# Patient Record
Sex: Female | Born: 1990 | Race: Black or African American | Hispanic: No | Marital: Single | Smoking: Never smoker
Health system: Southern US, Community
[De-identification: ages and names within clinical notes are randomized; demographics above are authoritative.]

## PROBLEM LIST (undated history)

## (undated) DIAGNOSIS — L509 Urticaria, unspecified: Secondary | ICD-10-CM

## (undated) DIAGNOSIS — L309 Dermatitis, unspecified: Secondary | ICD-10-CM

## (undated) DIAGNOSIS — F32A Depression, unspecified: Secondary | ICD-10-CM

## (undated) DIAGNOSIS — J069 Acute upper respiratory infection, unspecified: Secondary | ICD-10-CM

## (undated) DIAGNOSIS — J45909 Unspecified asthma, uncomplicated: Secondary | ICD-10-CM

## (undated) HISTORY — PX: WISDOM TOOTH EXTRACTION: SHX21

## (undated) HISTORY — DX: Acute upper respiratory infection, unspecified: J06.9

## (undated) HISTORY — DX: Urticaria, unspecified: L50.9

---
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
## 2008-01-31 IMAGING — US US OB FOLLOW-UP
1 series · 14 of 28 positions shown · non-contrast
Comparison: none

OBSTETRICAL ULTRASOUND:

 This ultrasound exam was performed in the [HOSPITAL] Ultrasound Department.  The OB US report was generated in the AS system, and faxed to the ordering physician.  This report is also available in [REDACTED] PACS.

[Series 1: us ob comp +14 wk · 14 of 113 slices shown]
[im 5/113]
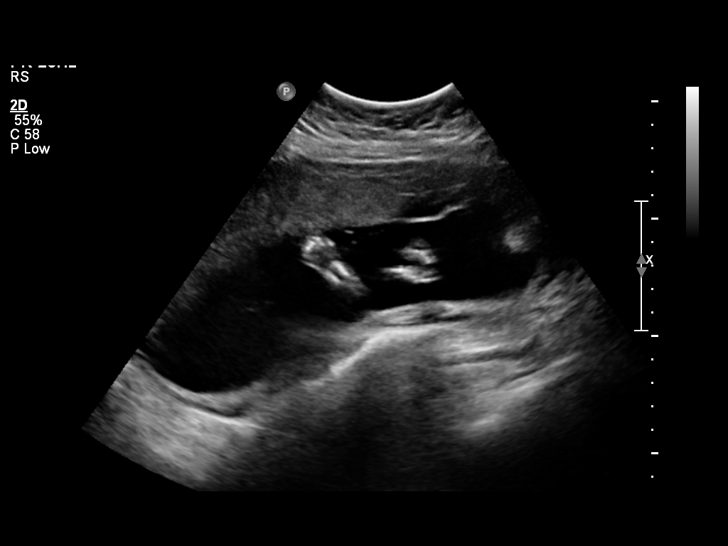
[im 13/113]
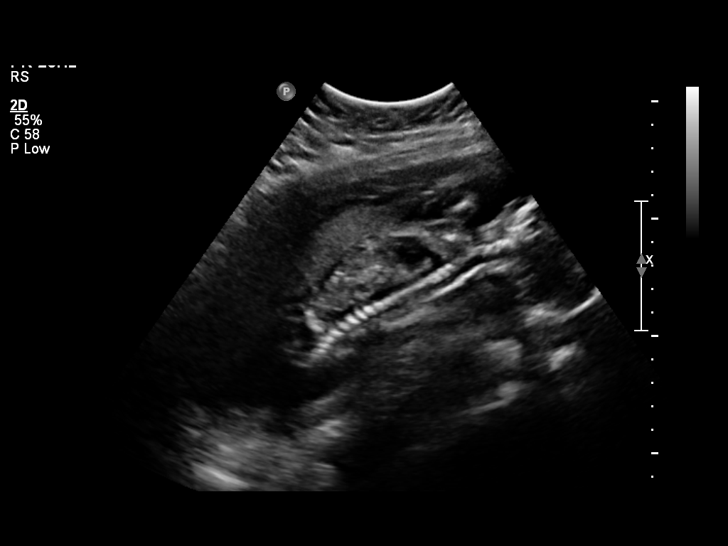
[im 21/113]
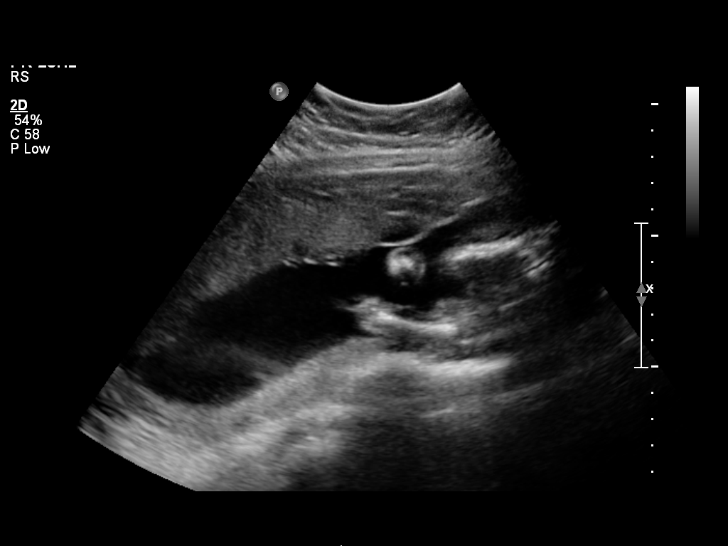
[im 30/113]
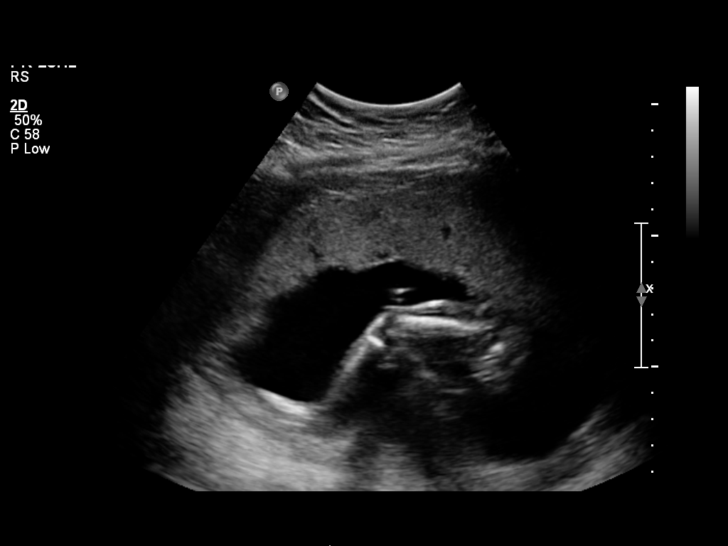
[im 38/113]
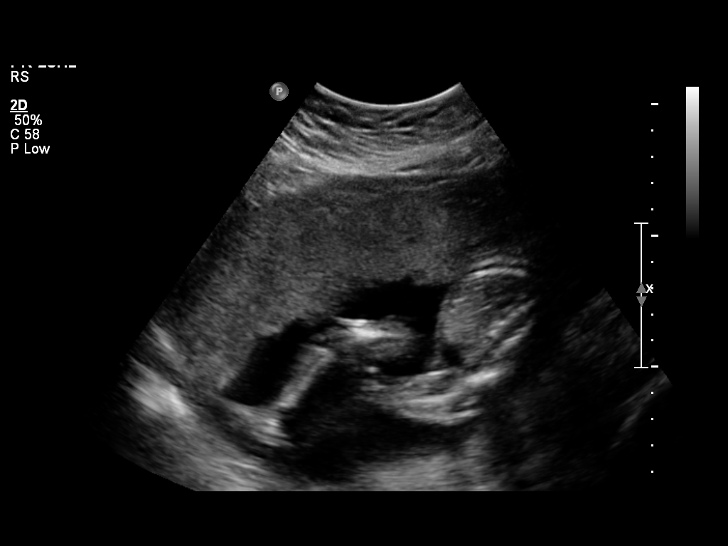
[im 46/113]
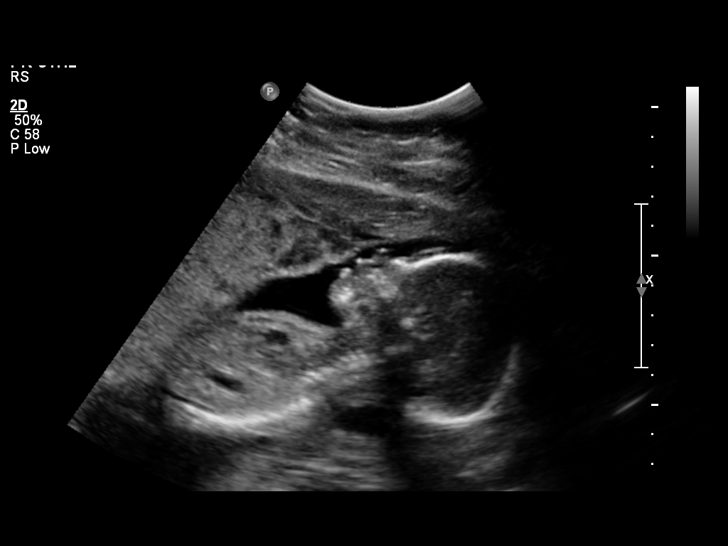
[im 54/113]
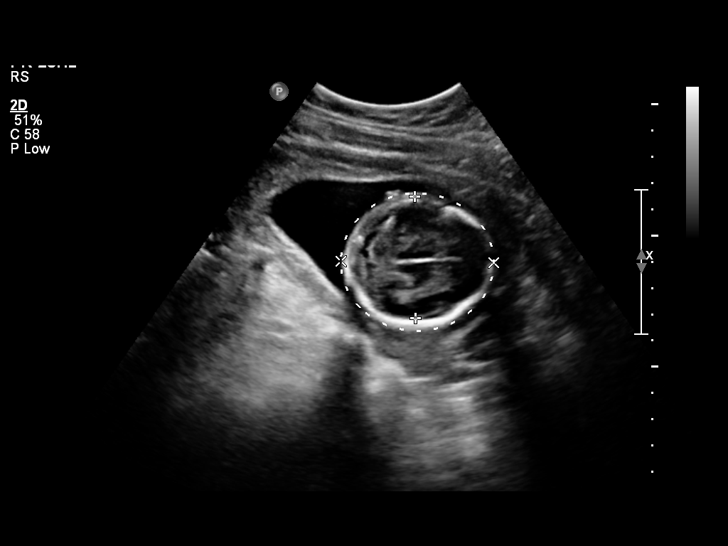
[im 63/113]
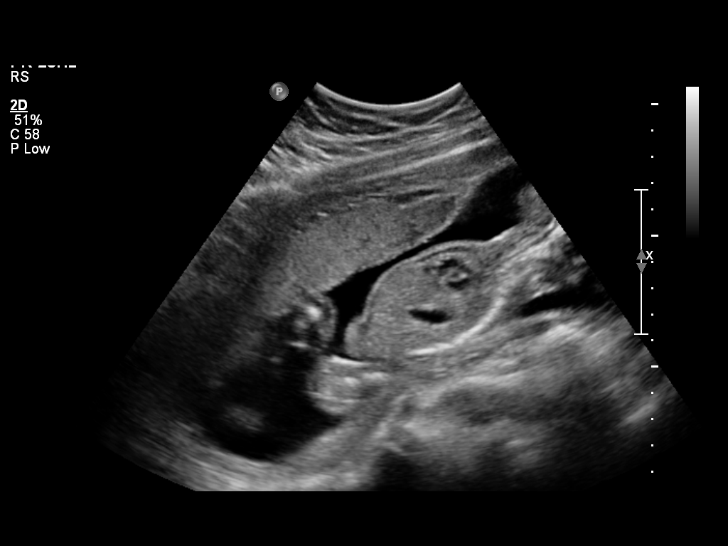
[im 71/113]
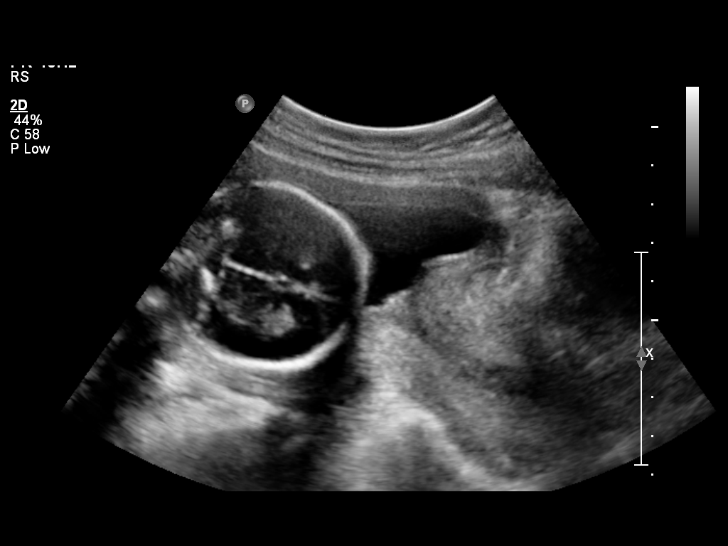
[im 79/113]
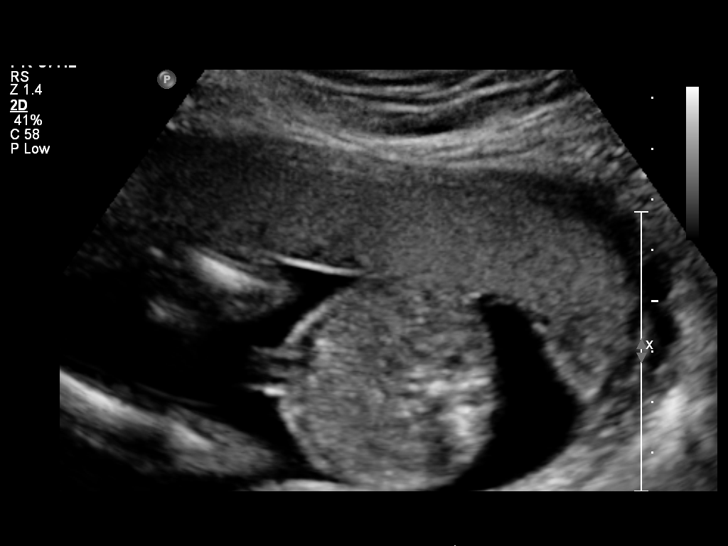
[im 88/113]
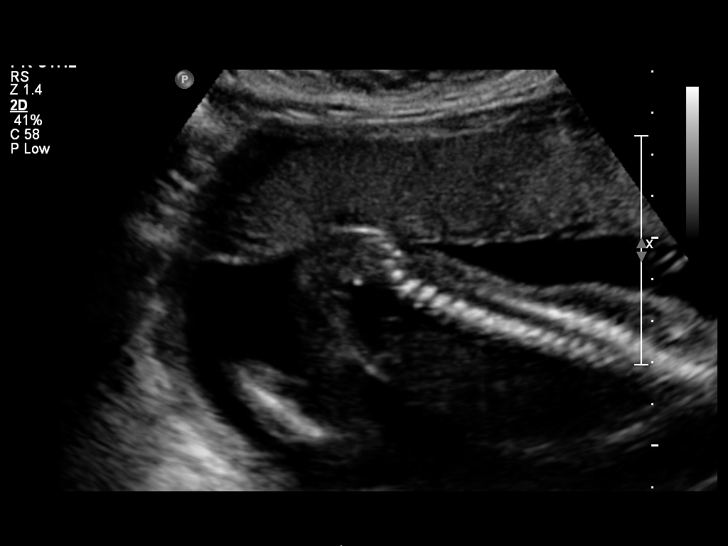
[im 96/113]
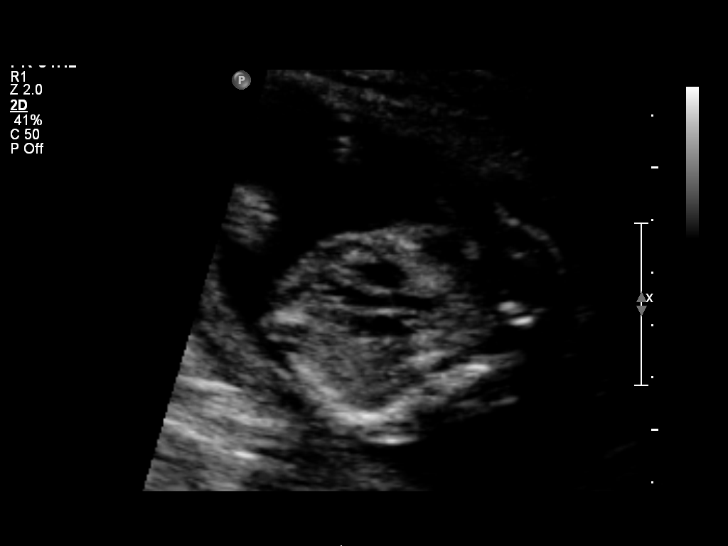
[im 104/113]
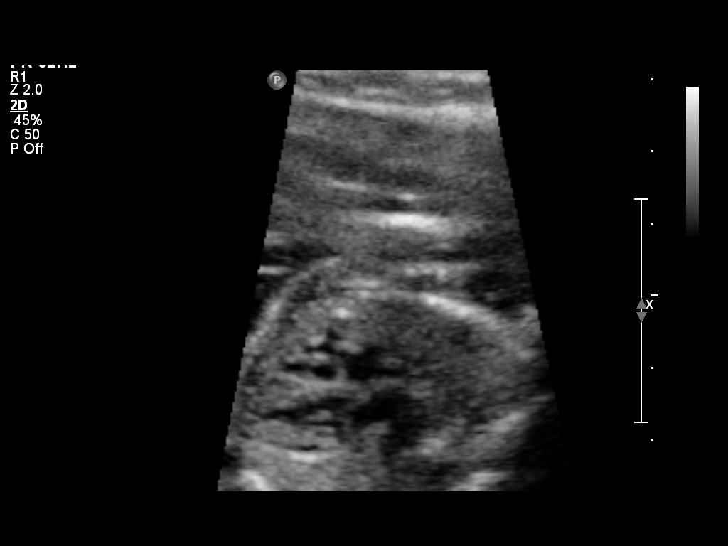
[im 113/113]
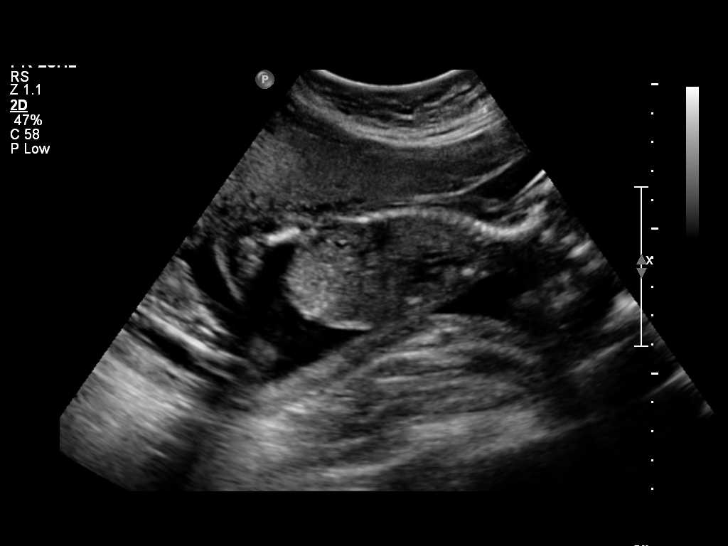

[14 of 28 positions shown; findings below may reference images not displayed]

IMPRESSION: See AS Obstetric US report.

## 2008-03-27 ENCOUNTER — Emergency Department (HOSPITAL_COMMUNITY): Admission: EM | Admit: 2008-03-27 | Discharge: 2008-03-27 | Payer: Self-pay | Admitting: Emergency Medicine

## 2008-08-20 ENCOUNTER — Emergency Department (HOSPITAL_COMMUNITY): Admission: EM | Admit: 2008-08-20 | Discharge: 2008-08-21 | Payer: Self-pay | Admitting: Emergency Medicine

## 2008-08-21 ENCOUNTER — Inpatient Hospital Stay (HOSPITAL_COMMUNITY): Admission: AD | Admit: 2008-08-21 | Discharge: 2008-09-03 | Payer: Self-pay | Admitting: Psychiatry

## 2008-08-21 ENCOUNTER — Ambulatory Visit: Payer: Self-pay | Admitting: Psychiatry

## 2008-11-07 ENCOUNTER — Inpatient Hospital Stay (HOSPITAL_COMMUNITY): Admission: AD | Admit: 2008-11-07 | Discharge: 2008-11-07 | Payer: Self-pay | Admitting: Obstetrics and Gynecology

## 2008-11-27 ENCOUNTER — Ambulatory Visit: Payer: Self-pay | Admitting: Family Medicine

## 2008-11-27 DIAGNOSIS — F339 Major depressive disorder, recurrent, unspecified: Secondary | ICD-10-CM

## 2008-11-27 DIAGNOSIS — F329 Major depressive disorder, single episode, unspecified: Secondary | ICD-10-CM

## 2008-11-27 DIAGNOSIS — L2089 Other atopic dermatitis: Secondary | ICD-10-CM | POA: Insufficient documentation

## 2008-11-27 HISTORY — DX: Major depressive disorder, recurrent, unspecified: F33.9

## 2008-12-05 ENCOUNTER — Ambulatory Visit: Payer: Self-pay | Admitting: Family Medicine

## 2008-12-05 ENCOUNTER — Encounter: Payer: Self-pay | Admitting: Family Medicine

## 2009-01-08 ENCOUNTER — Ambulatory Visit: Payer: Self-pay | Admitting: Family Medicine

## 2009-12-13 ENCOUNTER — Emergency Department (HOSPITAL_COMMUNITY): Admission: EM | Admit: 2009-12-13 | Discharge: 2009-12-13 | Payer: Self-pay | Admitting: Family Medicine

## 2009-12-19 ENCOUNTER — Inpatient Hospital Stay (HOSPITAL_COMMUNITY): Admission: AD | Admit: 2009-12-19 | Discharge: 2009-12-19 | Payer: Self-pay | Admitting: Obstetrics & Gynecology

## 2010-01-16 ENCOUNTER — Inpatient Hospital Stay (HOSPITAL_COMMUNITY)
Admission: AD | Admit: 2010-01-16 | Discharge: 2010-01-16 | Payer: Self-pay | Source: Home / Self Care | Admitting: Obstetrics and Gynecology

## 2010-03-01 ENCOUNTER — Inpatient Hospital Stay (HOSPITAL_COMMUNITY)
Admission: AD | Admit: 2010-03-01 | Discharge: 2010-03-01 | Payer: Self-pay | Source: Home / Self Care | Attending: Obstetrics & Gynecology | Admitting: Obstetrics & Gynecology

## 2010-03-04 LAB — POCT PREGNANCY, URINE: Preg Test, Ur: NEGATIVE

## 2010-03-04 LAB — URINALYSIS, ROUTINE W REFLEX MICROSCOPIC
Leukocytes, UA: NEGATIVE
Nitrite: NEGATIVE
Protein, ur: NEGATIVE mg/dL
Urine Glucose, Fasting: NEGATIVE mg/dL
Urobilinogen, UA: 0.2 mg/dL (ref 0.0–1.0)

## 2010-03-04 LAB — URINE MICROSCOPIC-ADD ON

## 2010-03-04 LAB — CBC
Hemoglobin: 11 g/dL — ABNORMAL LOW (ref 12.0–15.0)
MCHC: 31.9 g/dL (ref 30.0–36.0)
Platelets: 401 10*3/uL — ABNORMAL HIGH (ref 150–400)
RDW: 13.5 % (ref 11.5–15.5)

## 2010-03-21 ENCOUNTER — Encounter: Payer: Self-pay | Admitting: *Deleted

## 2010-04-03 ENCOUNTER — Encounter: Payer: Self-pay | Admitting: Family Medicine

## 2010-04-22 LAB — DIFFERENTIAL
Basophils Absolute: 0 10*3/uL (ref 0.0–0.1)
Basophils Absolute: 0.1 10*3/uL (ref 0.0–0.1)
Basophils Relative: 1 % (ref 0–1)
Eosinophils Relative: 2 % (ref 0–5)
Lymphocytes Relative: 39 % (ref 12–46)
Lymphocytes Relative: 42 % (ref 12–46)
Monocytes Absolute: 0.5 10*3/uL (ref 0.1–1.0)
Monocytes Absolute: 0.5 10*3/uL (ref 0.1–1.0)
Monocytes Relative: 8 % (ref 3–12)
Neutro Abs: 3 10*3/uL (ref 1.7–7.7)
Neutrophils Relative %: 48 % (ref 43–77)

## 2010-04-22 LAB — URINALYSIS, ROUTINE W REFLEX MICROSCOPIC
Bilirubin Urine: NEGATIVE
Glucose, UA: NEGATIVE mg/dL
Glucose, UA: NEGATIVE mg/dL
Ketones, ur: NEGATIVE mg/dL
Ketones, ur: NEGATIVE mg/dL
Nitrite: NEGATIVE
Protein, ur: NEGATIVE mg/dL
Specific Gravity, Urine: 1.015 (ref 1.005–1.030)
pH: 7.5 (ref 5.0–8.0)

## 2010-04-22 LAB — WET PREP, GENITAL
Trich, Wet Prep: NONE SEEN
Yeast Wet Prep HPF POC: NONE SEEN
Yeast Wet Prep HPF POC: NONE SEEN

## 2010-04-22 LAB — CBC
HCT: 34.6 % — ABNORMAL LOW (ref 36.0–46.0)
Hemoglobin: 11.3 g/dL — ABNORMAL LOW (ref 12.0–15.0)
MCHC: 32.6 g/dL (ref 30.0–36.0)
MCV: 88.9 fL (ref 78.0–100.0)
Platelets: 381 10*3/uL (ref 150–400)
RDW: 14.6 % (ref 11.5–15.5)
WBC: 6.2 10*3/uL (ref 4.0–10.5)
WBC: 6.5 10*3/uL (ref 4.0–10.5)

## 2010-04-22 LAB — URINE MICROSCOPIC-ADD ON

## 2010-04-22 LAB — POCT PREGNANCY, URINE
Preg Test, Ur: NEGATIVE
Preg Test, Ur: NEGATIVE

## 2010-04-22 LAB — GC/CHLAMYDIA PROBE AMP, URINE
Chlamydia, Swab/Urine, PCR: NEGATIVE
GC Probe Amp, Urine: NEGATIVE

## 2010-04-28 ENCOUNTER — Ambulatory Visit (INDEPENDENT_AMBULATORY_CARE_PROVIDER_SITE_OTHER): Payer: Medicaid Other | Admitting: Family Medicine

## 2010-04-28 ENCOUNTER — Other Ambulatory Visit (HOSPITAL_COMMUNITY)
Admission: RE | Admit: 2010-04-28 | Discharge: 2010-04-28 | Disposition: A | Discharge status: Home / Self Care | Payer: Medicaid Other | Source: Ambulatory Visit | Attending: Family Medicine | Admitting: Family Medicine

## 2010-04-28 VITALS — BP 110/68 | HR 64 | Temp 98.3°F | Wt 180.0 lb

## 2010-04-28 DIAGNOSIS — Z01419 Encounter for gynecological examination (general) (routine) without abnormal findings: Secondary | ICD-10-CM | POA: Insufficient documentation

## 2010-04-28 DIAGNOSIS — N921 Excessive and frequent menstruation with irregular cycle: Secondary | ICD-10-CM | POA: Insufficient documentation

## 2010-04-28 DIAGNOSIS — F329 Major depressive disorder, single episode, unspecified: Secondary | ICD-10-CM

## 2010-04-28 DIAGNOSIS — L2089 Other atopic dermatitis: Secondary | ICD-10-CM

## 2010-04-28 DIAGNOSIS — Z3009 Encounter for other general counseling and advice on contraception: Secondary | ICD-10-CM

## 2010-04-28 DIAGNOSIS — Z124 Encounter for screening for malignant neoplasm of cervix: Secondary | ICD-10-CM

## 2010-04-28 DIAGNOSIS — N920 Excessive and frequent menstruation with regular cycle: Secondary | ICD-10-CM

## 2010-04-28 LAB — CBC
HCT: 33.4 % — ABNORMAL LOW (ref 36.0–46.0)
MCHC: 31.1 g/dL (ref 30.0–36.0)
Platelets: 370 10*3/uL (ref 150–400)
RDW: 14.1 % (ref 11.5–15.5)
WBC: 6.8 10*3/uL (ref 4.0–10.5)

## 2010-04-28 MED ORDER — TRIAMCINOLONE ACETONIDE 0.1 % EX OINT: TOPICAL_OINTMENT | Freq: Two times a day (BID) | CUTANEOUS | Status: DC

## 2010-04-28 MED ORDER — CARRINGTON MOISTURE BARRIER EX CREA: TOPICAL_CREAM | CUTANEOUS | Status: AC | PRN

## 2010-04-28 MED ORDER — CITALOPRAM HYDROBROMIDE 40 MG PO TABS: 40.0000 mg | ORAL_TABLET | Freq: Every day | ORAL | Status: DC

## 2010-04-28 MED ORDER — ETONOGESTREL-ETHINYL ESTRADIOL 0.12-0.015 MG/24HR VA RING: 1.0000 | VAGINAL_RING | VAGINAL | Status: DC

## 2010-04-28 MED ORDER — CETIRIZINE HCL 10 MG PO TABS: 10.0000 mg | ORAL_TABLET | Freq: Every day | ORAL | Status: DC

## 2010-04-28 NOTE — Progress Notes (Signed)
  Subjective:     Carolyn Alvarez is a 20 y.o. female and is here for a comprehensive physical exam. The patient reports problems - depression and bleeding 20/30 days of the month since implanon was put in more than 2 years ago. Depression- dealing with lots of life stress, recently got to her own apt, had been homeless.  Feels that she is more down than up in terms of mood.  Denies SI, HI  Implanon- will be 1 years in May.  1st year was ok, but after that has been bleeidng 20/30 days in a month.  Would like implanon removed.  WOuld like nuvaring.  History   Social History  . Marital Status: Single    Spouse Name: N/A    Number of Children: N/A  . Years of Education: N/A   Occupational History  . Not on file.   Social History Main Topics  . Smoking status: Not on file  . Smokeless tobacco: Not on file  . Alcohol Use: Not on file  . Drug Use: Not on file  . Sexually Active: Not on file   Other Topics Concern  . Not on file   Social History Narrative  . No narrative on file   Health Maintenance  Topic Date Due  . Pap Smear  12/18/2006  . Tetanus/tdap  12/18/2007    The following portions of the patient's history were reviewed and updated as appropriate: allergies, current medications and problem list.  Review of Systems Pertinent items are noted in HPI.   Objective:    BP 110/68  Pulse 64  Temp(Src) 98.3 F (36.8 C) (Oral)  Wt 180 lb (81.647 kg) General appearance: alert, cooperative, mild distress and mildly obese Head: Normocephalic, without obvious abnormality, atraumatic Neck: no adenopathy, supple, symmetrical, trachea midline and thyroid not enlarged, symmetric, no tenderness/mass/nodules Lungs: clear to auscultation bilaterally and normal percussion bilaterally Heart: regular rate and rhythm, S1, S2 normal, no murmur, click, rub or gallop Abdomen: soft, non-tender; bowel sounds normal; no masses,  no organomegaly Pelvic: adnexae not palpable, cervix  normal in appearance, external genitalia normal, no cervical motion tenderness, rectovaginal septum normal, uterus normal size, shape, and consistency and vagina normal without discharge Extremities: extremities normal, atraumatic, no cyanosis or edema    Assessment:    Healthy female exam.  Mild depression Dysfunctional bleeding associated with implanon     Plan:    depression- restart celexa implanon- start nuvaring today to help with bleeding.  Make appt for implanon removal.  See After Visit Summary for Counseling Recommendations

## 2010-04-28 NOTE — Patient Instructions (Signed)
It was nice to see you today Please schedule a visit for implanon removal Start the nuvaring, this should help regulate bleeding Start taking iron twice a day

## 2010-05-14 ENCOUNTER — Encounter: Payer: Self-pay | Admitting: Family Medicine

## 2010-05-14 ENCOUNTER — Ambulatory Visit (INDEPENDENT_AMBULATORY_CARE_PROVIDER_SITE_OTHER): Payer: Medicaid Other | Admitting: Family Medicine

## 2010-05-14 VITALS — BP 118/77 | HR 74 | Temp 98.8°F | Wt 185.4 lb

## 2010-05-14 DIAGNOSIS — N920 Excessive and frequent menstruation with regular cycle: Secondary | ICD-10-CM

## 2010-05-14 DIAGNOSIS — Z3009 Encounter for other general counseling and advice on contraception: Secondary | ICD-10-CM

## 2010-05-14 DIAGNOSIS — Z30017 Encounter for initial prescription of implantable subdermal contraceptive: Secondary | ICD-10-CM

## 2010-05-14 NOTE — Assessment & Plan Note (Signed)
implanon removed today.  nuvaring to continue

## 2010-05-14 NOTE — Progress Notes (Signed)
implanon removal.  Pt consented.  Area cleaned with alcohol and place marked.  3 cc of lidocaine injected.  Sterile gloves donned, area cleaned with betadine x 3.  1cm incision made with scalpel.  Tip visualized and area probed with curved forceps.  implanon removed and measured to 4cm.  Area was cleaned, steristrip applied, and bandage applied

## 2010-05-14 NOTE — Assessment & Plan Note (Signed)
Improved.  On nuvaring.  implanon out today

## 2010-05-16 LAB — CBC
Hemoglobin: 10.9 g/dL — ABNORMAL LOW (ref 12.0–15.0)
MCHC: 33.1 g/dL (ref 30.0–36.0)
RBC: 3.72 MIL/uL — ABNORMAL LOW (ref 3.87–5.11)
WBC: 6.5 10*3/uL (ref 4.0–10.5)

## 2010-05-16 LAB — POCT PREGNANCY, URINE: Preg Test, Ur: NEGATIVE

## 2010-05-16 LAB — URINALYSIS, ROUTINE W REFLEX MICROSCOPIC
Glucose, UA: NEGATIVE mg/dL
Leukocytes, UA: NEGATIVE
Protein, ur: NEGATIVE mg/dL
Specific Gravity, Urine: >1.03 — ABNORMAL HIGH (ref 1.005–1.030)
pH: 6 (ref 5.0–8.0)

## 2010-05-16 LAB — WET PREP, GENITAL

## 2010-05-16 LAB — URINE MICROSCOPIC-ADD ON

## 2010-05-18 LAB — DIFFERENTIAL
Basophils Absolute: 0 10*3/uL (ref 0.0–0.1)
Lymphocytes Relative: 39 % (ref 12–46)
Lymphs Abs: 3 10*3/uL (ref 0.7–4.0)
Neutro Abs: 4.2 10*3/uL (ref 1.7–7.7)

## 2010-05-18 LAB — CBC
Hemoglobin: 11.4 g/dL — ABNORMAL LOW (ref 12.0–15.0)
Platelets: 379 10*3/uL (ref 150–400)
RDW: 13.9 % (ref 11.5–15.5)
WBC: 7.8 10*3/uL (ref 4.0–10.5)

## 2010-05-18 LAB — URINALYSIS, ROUTINE W REFLEX MICROSCOPIC
Bilirubin Urine: NEGATIVE
Glucose, UA: NEGATIVE mg/dL
Glucose, UA: NEGATIVE mg/dL
Hgb urine dipstick: NEGATIVE
Ketones, ur: NEGATIVE mg/dL
Ketones, ur: NEGATIVE mg/dL
Nitrite: NEGATIVE
pH: 5.5 (ref 5.0–8.0)
pH: 6 (ref 5.0–8.0)

## 2010-05-18 LAB — RAPID URINE DRUG SCREEN, HOSP PERFORMED
Barbiturates: NOT DETECTED
Benzodiazepines: NOT DETECTED
Cocaine: NOT DETECTED
Opiates: NOT DETECTED

## 2010-05-18 LAB — ETHANOL: Alcohol, Ethyl (B): <5 mg/dL (ref 0–10)

## 2010-05-18 LAB — BASIC METABOLIC PANEL
BUN: 12 mg/dL (ref 6–23)
Calcium: 9.3 mg/dL (ref 8.4–10.5)
GFR calc non Af Amer: >60 mL/min (ref >60)
Glucose, Bld: 88 mg/dL (ref 70–99)
Potassium: 3.9 mEq/L (ref 3.5–5.1)
Sodium: 138 mEq/L (ref 135–145)

## 2010-05-18 LAB — POCT PREGNANCY, URINE: Preg Test, Ur: NEGATIVE

## 2010-05-18 LAB — URINE MICROSCOPIC-ADD ON

## 2010-05-29 ENCOUNTER — Ambulatory Visit: Payer: Medicaid Other | Admitting: Family Medicine

## 2010-05-29 ENCOUNTER — Telehealth: Payer: Self-pay | Admitting: Family Medicine

## 2010-05-29 NOTE — Telephone Encounter (Signed)
Pt had appt in colpo clinic today to check for her iud, she thinks it came out. Pt missed the appt, wants to know what she should do since we are full today?

## 2010-05-30 NOTE — Telephone Encounter (Signed)
Patient called and is wanting to know what MD thinks she should do

## 2010-05-30 NOTE — Telephone Encounter (Signed)
Called and left VM for pt to call back.  Pt should use back up method of contraception until able to determine IUD status

## 2010-05-30 NOTE — Telephone Encounter (Signed)
When patient calls back she needs to schedule another appt and use Back up method as mention by MD previously [employer], Maryjo Rochester

## 2010-06-24 NOTE — H&P (Signed)
NAME:  Carolyn Alvarez, Carolyn Alvarez         ACCOUNT NO.:  [id number]   MEDICAL RECORD NO.:  [id number]          PATIENT TYPE:  IPS   LOCATION:  0400                          FACILITY:  [facility]   PHYSICIAN:  Anselm Jungling, MD  DATE OF BIRTH:  Mar 02, 1990   DATE OF ADMISSION:  08/21/2008  DATE OF DISCHARGE:                       PSYCHIATRIC ADMISSION ASSESSMENT   HISTORY OF PRESENT ILLNESS:  The patient reports with a history  depression, suicidal thoughts, had no specific plan.  Reports having  conflict with her mother, who asked her to leave the home with mother  not being supportive of her being a single mother.  Her stressors are  that the patient is currently homeless.   PAST PSYCHIATRIC HISTORY:  First admission to [facility].  No other psychiatric admissions.  She has been seeing a therapist, Helane Gunther at [facility] for the past 2 months.   SOCIAL HISTORY:  A 20 year old female lives in [place], currently  homeless.  She has a 38-month-old daughter that her mother is caring  for.  The patient is a sophomore at [employer]   FAMILY HISTORY:  None.   ALCOHOL AND DRUG HISTORY:  The patient drinks alcohol on occasion.  Denies any other substances.   PRIMARY CARE Dorr Perrot:  [facility], sees Dr. Sandi Mealy.   MEDICAL PROBLEMS:  Denies any acute or chronic health issues.   MEDICATIONS:  None prior to arrival.   DRUG ALLERGIES:  NO KNOWN ALLERGIES.   PHYSICAL EXAMINATION:  This is a young female who was fully assessed at  [facility].  Her physical exam was reviewed.  No significant findings.   LABORATORY DATA:  Shows urine that is negative.  Urine drug screen is  negative.  BMET within normal limits.  Alcohol level less than 6.  Urine  pregnancy test negative.  CBC shows a hemoglobin of 11.4, hematocrit of  34.5.   MENTAL STATUS EXAM:  The patient is in the bed.  She is very  cooperative, good eye contact in no acute distress.  Speech is clear,  normal  pace and tone.  The patient's mood is depressed.  Her affect, she  does appears sad.  Thought processes are coherent and goal directed.  Promises safety.  Cognition function intact.  Her memory appears intact.  Judgment and insight are fair.   Axis I:  Depressive disorder, not otherwise specified.  Axis II:  Deferred.  Axis III:  No acute or chronic health issues.  Axis IV:  Problems with housing.  Axis V:  Global Assessment of Functioning current is 35-40.   PLAN:  Our plan is to contact for safety.  The patient may benefit from  an antidepressant.  We will contact mother for background and for the  patient to return to prior living arrangement.  The patient was  encouraged groups.  The patient will need to continue with her  counseling.  Her tentative length of stay at this time is 2-3 days.      Landry Corporal, N.P.      Anselm Jungling, MD  Electronically Signed    JO/MEDQ  D:  08/22/2008  T:  08/22/2008  Job:  [id number]

## 2010-06-27 NOTE — Discharge Summary (Signed)
NAME:  Carolyn Alvarez, Carolyn Alvarez         ACCOUNT NO.:  [id number]   MEDICAL RECORD NO.:  [id number]          PATIENT TYPE:  IPS   LOCATION:  0400                          FACILITY:  [facility]   PHYSICIAN:  Anselm Jungling, MD  DATE OF BIRTH:  10-12-1990   DATE OF ADMISSION:  08/21/2008  DATE OF DISCHARGE:  09/03/2008                               DISCHARGE SUMMARY   IDENTIFYING DATA/REASON FOR ADMISSION:  This was an inpatient  psychiatric admission for Carolyn Alvarez, a 20 year old unmarried single  mother who was admitted due to severe depression with suicidal ideation.  Please refer to the admission note for further details pertaining to the  symptoms, circumstances and history that led to her hospitalization.  She was given an initial Axis I diagnosis of depressive disorder NOS.   MEDICAL/LABORATORY:  The patient was medically and physically assessed  by the psychiatric nurse practitioner.  She was in generally good health  without any active chronic medical problems.  She had been nursing her  young child at the time of admission, but made a decision to wean the  child during the course of her hospital stay in part because of  consideration of psychotropic medication trials for depressive disorder.  There were no other significant medical issues.   HOSPITAL COURSE:  The patient was admitted to the adult inpatient  psychiatric service.  She presented as a well-nourished, normally-  developed young adult female who was alert, fully oriented, pleasant,  but very sad.  She denied any further suicidal ideation, intent or plan.  There were no signs or symptoms of psychosis or thought disorder.  She  accepted being in the inpatient setting for help and treatment.   She was agreeable to a trial of Zoloft 50 mg daily to address her  depressive disorder.  Side effects, risks, and benefits were discussed  at length.  As referenced above, she made a decision to wean her child  so that she could take  Zoloft without concern about any effect on the  child.  She tolerated Zoloft well.   She participated in various therapeutic groups, activities and classes  geared towards helping acquire better coping skills, a better  understanding of her underlying disorder and dynamics, the development  of an aftercare plan.  She worked closely with case management towards  an aftercare plan that would provide for both her mental health needs,  her situational and residential needs.   Eventually, she was accepted at a program called [facility], an  assisted living program for young mothers.  Other discharge and  aftercare planning was as follows.   MEDICATIONS:  1. Zoloft 50 mg daily.  2. Ambien 10 mg h.s. p.r.n. insomnia.   FOLLOW UP:  1. The patient was to follow up at [facility] with      an appointment to see their psychiatrist on October 02, 2008 at      10:30 a.m.  2. She was also referred to the [facility], appointment to      be arranged at the time of this dictation.   DISCHARGE DIAGNOSES:  AXIS I:  Depressive disorder not otherwise  specified, rule out postpartum depression.  AXIS II:  Deferred.  AXIS III:  No acute or chronic illnesses.  AXIS IV:  Stressors severe.  AXIS V:  Global assessment of functioning on discharge 55.      Anselm Jungling, MD  Electronically Signed     SPB/MEDQ  D:  09/06/2008  T:  09/06/2008  Job:  [id number]

## 2010-08-21 ENCOUNTER — Ambulatory Visit: Payer: Medicaid Other | Admitting: Family Medicine

## 2010-11-18 LAB — CBC
Hemoglobin: 11.4 — ABNORMAL LOW
RDW: 13.6
WBC: 8.5

## 2010-11-18 LAB — GC/CHLAMYDIA PROBE AMP, GENITAL: Chlamydia, DNA Probe: NEGATIVE

## 2010-11-18 LAB — URINALYSIS, ROUTINE W REFLEX MICROSCOPIC
Ketones, ur: 15 — AB
Nitrite: NEGATIVE
Specific Gravity, Urine: 1.02
pH: 6.5

## 2010-11-18 LAB — SICKLE CELL SCREEN: Sickle Cell Screen: NEGATIVE

## 2010-11-18 LAB — HEPATITIS B SURFACE ANTIGEN: Hepatitis B Surface Ag: NEGATIVE

## 2010-11-18 LAB — DIFFERENTIAL
Basophils Absolute: 0
Lymphocytes Relative: 23
Monocytes Absolute: 0.6
Neutro Abs: 5.8

## 2010-11-18 LAB — RUBELLA SCREEN: Rubella: 51.3 — ABNORMAL HIGH

## 2010-11-21 LAB — POCT PREGNANCY, URINE: Preg Test, Ur: POSITIVE

## 2010-11-27 LAB — POCT RAPID STREP A: Streptococcus, Group A Screen (Direct): NEGATIVE

## 2010-11-27 LAB — STREP A DNA PROBE

## 2011-03-13 ENCOUNTER — Ambulatory Visit (INDEPENDENT_AMBULATORY_CARE_PROVIDER_SITE_OTHER): Payer: Self-pay | Admitting: Family Medicine

## 2011-03-13 ENCOUNTER — Encounter: Payer: Self-pay | Admitting: Family Medicine

## 2011-03-13 VITALS — BP 128/85 | HR 76 | Temp 98.0°F | Ht 61.0 in | Wt 210.0 lb

## 2011-03-13 DIAGNOSIS — N915 Oligomenorrhea, unspecified: Secondary | ICD-10-CM | POA: Insufficient documentation

## 2011-03-13 DIAGNOSIS — N912 Amenorrhea, unspecified: Secondary | ICD-10-CM

## 2011-03-13 DIAGNOSIS — F329 Major depressive disorder, single episode, unspecified: Secondary | ICD-10-CM

## 2011-03-13 DIAGNOSIS — F3289 Other specified depressive episodes: Secondary | ICD-10-CM

## 2011-03-13 LAB — POCT URINE PREGNANCY: Preg Test, Ur: NEGATIVE

## 2011-03-13 MED ORDER — CITALOPRAM HYDROBROMIDE 40 MG PO TABS: 40.0000 mg | ORAL_TABLET | Freq: Every day | ORAL | Status: DC

## 2011-03-13 NOTE — Patient Instructions (Signed)
Check a pregnancy every week- can get from [employer].  Im not worried about one missed period.  Take a multivitamin every day  If you notice no period in 1-2 months, abdominal pain, vaginal discharge, or other signs, then come back sooner.  Focus on getting back to a healthier weight, good sleep, daily exercise

## 2011-03-13 NOTE — Assessment & Plan Note (Signed)
Now 5.5 weeks from LMP for what is usually a 35 day cycle per patient.  Upreg neg today.  Not concerned for need for further workup at this point.  Advised weekly home preg tests in case of early pregnancy, advised follow-up for red flags or if amenorrhea continues.

## 2011-03-13 NOTE — Progress Notes (Signed)
  Subjective:    Patient ID: Carolyn Alvarez, female    DOB: [birth year], 21 y.o.   MRN: [id number]  HPI  Here for evaluation of missed period  Not currently on contraception because medicaid ran out, but her mongamous partner says he has had a vasectomy.  LMP Dec 17th.  Periods usually 35 days.  Last used contraception in July- Nuva ring.  Had implanon removed in may.  Had regular monthly periods in between then.  This is her first later period.  No vaginal bleeding, vaginal discharge, dysuria.  Did have some moliminal symptoms around the 24th, now resolved. Review of Systems See HPI    Objective:   Physical Exam GEN: NAD      Assessment & Plan:

## 2011-03-13 NOTE — Assessment & Plan Note (Signed)
Agreed to refill celexa at previous dose of 40 mg.  She states this was very effective for her and had minimal side effects with quick titration up last time she self- discontinued.  Advised her to follow-up with PCP to assess response to tx.

## 2011-04-08 ENCOUNTER — Ambulatory Visit: Payer: Self-pay | Admitting: Family Medicine

## 2011-08-08 ENCOUNTER — Emergency Department (HOSPITAL_COMMUNITY)
Admission: EM | Admit: 2011-08-08 | Discharge: 2011-08-08 | Disposition: A | Discharge status: Home / Self Care | Payer: Self-pay | Source: Home / Self Care | Attending: Emergency Medicine | Admitting: Emergency Medicine

## 2011-08-08 ENCOUNTER — Encounter (HOSPITAL_COMMUNITY): Payer: Self-pay | Admitting: *Deleted

## 2011-08-08 DIAGNOSIS — IMO0002 Reserved for concepts with insufficient information to code with codable children: Secondary | ICD-10-CM

## 2011-08-08 DIAGNOSIS — M5410 Radiculopathy, site unspecified: Secondary | ICD-10-CM

## 2011-08-08 HISTORY — DX: Dermatitis, unspecified: L30.9

## 2011-08-08 MED ORDER — PREDNISONE 10 MG PO TABS: ORAL_TABLET | ORAL | Status: DC

## 2011-08-08 MED ORDER — METHOCARBAMOL 500 MG PO TABS: 500.0000 mg | ORAL_TABLET | Freq: Three times a day (TID) | ORAL | Status: DC

## 2011-08-08 MED ORDER — KETOROLAC TROMETHAMINE 60 MG/2ML IM SOLN
INTRAMUSCULAR | Status: AC
  Filled 2011-08-08: qty 2

## 2011-08-08 MED ORDER — METHYLPREDNISOLONE ACETATE 80 MG/ML IJ SUSP
INTRAMUSCULAR | Status: AC
  Filled 2011-08-08: qty 1

## 2011-08-08 MED ORDER — TRAMADOL HCL 50 MG PO TABS: 100.0000 mg | ORAL_TABLET | Freq: Three times a day (TID) | ORAL | Status: AC | PRN

## 2011-08-08 MED ORDER — MELOXICAM 15 MG PO TABS: 15.0000 mg | ORAL_TABLET | Freq: Every day | ORAL | Status: DC

## 2011-08-08 MED ORDER — KETOROLAC TROMETHAMINE 60 MG/2ML IM SOLN
60.0000 mg | Freq: Once | INTRAMUSCULAR | Status: AC
  Administered 2011-08-08: 60 mg via INTRAMUSCULAR

## 2011-08-08 MED ORDER — METHYLPREDNISOLONE ACETATE 80 MG/ML IJ SUSP
80.0000 mg | Freq: Once | INTRAMUSCULAR | Status: AC
  Administered 2011-08-08: 80 mg via INTRAMUSCULAR

## 2011-08-08 NOTE — ED Notes (Signed)
Pt with pain right foot onset yesterday onset 1030 am   Wore  High heels night before 5 - 6 inch heels - no known injury - pain across mid anterior toes

## 2011-08-08 NOTE — ED Provider Notes (Signed)
Chief Complaint  Patient presents with  . Foot Pain    History of Present Illness:   The patient is a 21 year old female who experienced fairly sudden onset of pain that began in the middle 3 toes of the right foot yesterday. She denies any injury. There was no redness or swelling. The pain felt like a numb, burning, stinging sensation which radiated up into the foot, ankle, lower leg and at times which showed only up into the hip. She denies any weakness of the leg. The pain would be worse with light touch to the foot, to the point where she could not stand have a shoe on the foot or to stand. She's never had pain like this before. She denies any skin rash. She has had some lower back pain but it's been on the left.  Review of Systems:  Other than noted above, the patient denies any of the following symptoms: Systemic:  No fevers, chills, sweats, or aches.  No fatigue or tiredness. Musculoskeletal:  No joint pain, arthritis, bursitis, swelling, back pain, or neck pain. Neurological:  No muscular weakness, paresthesias, headache, or trouble with speech or coordination.  No dizziness.   PMFSH:  Past medical history, family history, social history, meds, and allergies were reviewed.  Physical Exam:   Vital signs:  BP 117/78  Pulse 70  Temp 98.6 F (37 C) (Oral)  Resp 20  SpO2 99%  LMP 07/18/2011 Gen:  Alert and oriented times 3.  In no distress. Musculoskeletal: The foot looks completely normal with no swelling, redness, or heat. The 3 toes and compression are extremely painful even to light touch. She cannot stand any touch over the dorsum of the foot, ankle, or lower leg. Sensation appears to be intact. She is able to move her foot up and down well. Unable to do much in the way of muscle strength testing. She could not even stand to have her pedal pulses tested. Otherwise, all joints had a full a ROM with no swelling, bruising or deformity.  No edema, pulses full. Extremities were warm and  pink.  Capillary refill was brisk.  Skin:  Clear, warm and dry.  No rash. Neuro:  Alert and oriented times 3.  Muscle strength was normal.  Sensation was intact to light touch.   Course in Urgent Care Center:   She was given Toradol 60 mg IM and Depo-Medrol 80 mg IM.  Assessment:  The encounter diagnosis was Radiculopathy. This appears to be a neuropathic type of pain. Possibly due to I nerve compression such as from a Morton's neuroma or tarsal tunnel syndrome. It could be a compression from the other and as with sciatica, piriformis syndrome, or lumbar radiculopathy, or possibly some other kind of neuropathy such as shingles.  Plan:   1.  The following meds were prescribed:   New Prescriptions   MELOXICAM (MOBIC) 15 MG TABLET    Take 1 tablet (15 mg total) by mouth daily.   PREDNISONE (DELTASONE) 10 MG TABLET    Take 4 tabs daily for 4 days, 3 tabs daily for 4 days, 2 tabs daily for 4 days, then 1 tab daily for 4 days.   TRAMADOL (ULTRAM) 50 MG TABLET    Take 2 tablets (100 mg total) by mouth every 8 (eight) hours as needed for pain.   2.  The patient was instructed in symptomatic care, including rest and activity, elevation, application of ice and compression.  Appropriate handouts were given. 3.  The patient  was told to return if becoming worse in any way, if no better in 3 or 4 days, and given some red flag symptoms that would indicate earlier return.   4.  The patient was told to follow up with Dr. Porfirio Mylar Dohmeier if no better in one week.   Reuben Likes, MD 08/08/11 [phone]

## 2011-08-08 NOTE — Discharge Instructions (Signed)

## 2011-11-21 ENCOUNTER — Emergency Department (HOSPITAL_COMMUNITY)
Admission: EM | Admit: 2011-11-21 | Discharge: 2011-11-21 | Disposition: A | Discharge status: Home Health | Payer: No Typology Code available for payment source | Attending: Emergency Medicine | Admitting: Emergency Medicine

## 2011-11-21 ENCOUNTER — Encounter (HOSPITAL_COMMUNITY): Payer: Self-pay | Admitting: *Deleted

## 2011-11-21 ENCOUNTER — Emergency Department (HOSPITAL_COMMUNITY): Payer: No Typology Code available for payment source

## 2011-11-21 DIAGNOSIS — L259 Unspecified contact dermatitis, unspecified cause: Secondary | ICD-10-CM | POA: Insufficient documentation

## 2011-11-21 DIAGNOSIS — R079 Chest pain, unspecified: Secondary | ICD-10-CM | POA: Insufficient documentation

## 2011-11-21 DIAGNOSIS — T148XXA Other injury of unspecified body region, initial encounter: Secondary | ICD-10-CM

## 2011-11-21 DIAGNOSIS — IMO0002 Reserved for concepts with insufficient information to code with codable children: Secondary | ICD-10-CM | POA: Insufficient documentation

## 2011-11-21 DIAGNOSIS — Z79899 Other long term (current) drug therapy: Secondary | ICD-10-CM | POA: Insufficient documentation

## 2011-11-21 LAB — CBC
HCT: 34.1 % — ABNORMAL LOW (ref 36.0–46.0)
Hemoglobin: 10.9 g/dL — ABNORMAL LOW (ref 12.0–15.0)
MCH: 27.8 pg (ref 26.0–34.0)
MCHC: 32 g/dL (ref 30.0–36.0)
RBC: 3.92 MIL/uL (ref 3.87–5.11)

## 2011-11-21 MED ORDER — IBUPROFEN 600 MG PO TABS: 600.0000 mg | ORAL_TABLET | Freq: Four times a day (QID) | ORAL | Status: DC | PRN

## 2011-11-21 MED ORDER — IBUPROFEN 400 MG PO TABS
600.0000 mg | ORAL_TABLET | Freq: Once | ORAL | Status: AC
  Administered 2011-11-21: 600 mg via ORAL
  Filled 2011-11-21: qty 1

## 2011-11-21 MED ORDER — SODIUM CHLORIDE 0.9 % IV BOLUS (SEPSIS)
1000.0000 mL | Freq: Once | INTRAVENOUS | Status: AC
  Administered 2011-11-21: 1000 mL via INTRAVENOUS

## 2011-11-21 MED ORDER — METHOCARBAMOL 500 MG PO TABS
500.0000 mg | ORAL_TABLET | Freq: Once | ORAL | Status: AC
  Administered 2011-11-21: 500 mg via ORAL
  Filled 2011-11-21: qty 1

## 2011-11-21 MED ORDER — HYDROCODONE-ACETAMINOPHEN 5-325 MG PO TABS: 1.0000 | ORAL_TABLET | ORAL | Status: DC | PRN

## 2011-11-21 MED ORDER — IOHEXOL 300 MG/ML  SOLN
100.0000 mL | Freq: Once | INTRAMUSCULAR | Status: AC | PRN
  Administered 2011-11-21: 100 mL via INTRAVENOUS
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
## 2011-11-21 NOTE — ED Notes (Signed)
IIV swollen and red at site at left [facility]. Removed with cath intact

## 2011-11-21 NOTE — ED Notes (Signed)
Pt amb to BR without problem. States she is ready to go home

## 2011-11-21 NOTE — ED Notes (Signed)
Resting quietly, awaiting CT results.

## 2011-11-21 NOTE — ED Notes (Signed)
Pt returned from CT scan via stretcher, requesting food and drink.  Explained that we must wait for results of CT. Family member at bedside
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
## 2011-11-21 NOTE — ED Notes (Signed)
Pt states she is unable to void. States "she voided 'right before she came over here"
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
## 2011-11-22 NOTE — ED Provider Notes (Signed)
Medical screening examination/treatment/procedure(s) were performed by non-physician practitioner and as supervising physician I was immediately available for consultation/collaboration.  Derwood Kaplan, MD 11/22/11 1255

## 2011-11-25 ENCOUNTER — Encounter: Payer: Self-pay | Admitting: Family Medicine

## 2011-11-25 ENCOUNTER — Ambulatory Visit (INDEPENDENT_AMBULATORY_CARE_PROVIDER_SITE_OTHER): Payer: Medicaid Other | Admitting: Family Medicine

## 2011-11-25 VITALS — BP 122/78 | HR 55 | Temp 98.4°F | Ht 61.0 in | Wt 221.0 lb

## 2011-11-25 DIAGNOSIS — Z23 Encounter for immunization: Secondary | ICD-10-CM

## 2011-11-25 DIAGNOSIS — R0789 Other chest pain: Secondary | ICD-10-CM | POA: Insufficient documentation
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
## 2011-11-30 ENCOUNTER — Encounter: Payer: Self-pay | Admitting: Family Medicine

## 2011-11-30 ENCOUNTER — Ambulatory Visit (INDEPENDENT_AMBULATORY_CARE_PROVIDER_SITE_OTHER): Payer: Medicaid Other | Admitting: Family Medicine

## 2011-11-30 VITALS — BP 116/81 | HR 77 | Temp 98.1°F | Ht 61.0 in | Wt 221.0 lb

## 2011-11-30 DIAGNOSIS — M549 Dorsalgia, unspecified: Secondary | ICD-10-CM | POA: Insufficient documentation

## 2011-11-30 MED ORDER — CYCLOBENZAPRINE HCL 5 MG PO TABS: 5.0000 mg | ORAL_TABLET | Freq: Three times a day (TID) | ORAL | Status: DC | PRN

## 2011-11-30 NOTE — Patient Instructions (Signed)
Needs to have back massaged by boyfriend at least 3x each day for at least 5 minutes.   Muscle Cramps Muscle cramps are due to sudden involuntary muscle contraction. This means you have no control over the tightening of a muscle (or muscles). Often there are no obvious causes. Muscle cramps may occur with overexertion. They may also occur with chilling of the muscles. An example of a muscle chilling activity is swimming. It is uncommon for cramps to be due to a serious underlying disorder. In most cases, muscle cramps improve (or leave) within minutes. CAUSES  Some common causes are:  Injury.  Infections, especially viral.  Abnormal levels of the salts and ions in your blood (electrolytes). This could happen if you are taking water pills (diuretics).  Blood vessel disease where not enough blood is getting to the muscles (intermittent claudication). Some uncommon causes are:  Side effects of some medicine (such as lithium).  Alcohol abuse.  Diseases where there is soreness (inflammation) of the muscular system. HOME CARE INSTRUCTIONS   It may be helpful to massage, stretch, and relax the affected muscle.  Taking a dose of over-the-counter diphenhydramine is helpful for night leg cramps. SEEK MEDICAL CARE IF:  Cramps are frequent and not relieved with medicine. MAKE SURE YOU:   Understand these instructions.  Will watch your condition.  Will get help right away if you are not doing well or get worse. Document Released: 07/18/2001 Document Revised: 04/20/2011 Document Reviewed: 01/18/2008 [facility] Patient Information 2013 [place], [place].
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
## 2012-03-01 ENCOUNTER — Telehealth: Payer: Self-pay | Admitting: Family Medicine

## 2012-03-01 ENCOUNTER — Ambulatory Visit (INDEPENDENT_AMBULATORY_CARE_PROVIDER_SITE_OTHER): Payer: Medicaid Other | Admitting: *Deleted

## 2012-03-01 DIAGNOSIS — N912 Amenorrhea, unspecified: Secondary | ICD-10-CM

## 2012-03-01 LAB — POCT URINE PREGNANCY: Preg Test, Ur: NEGATIVE

## 2012-03-01 NOTE — Progress Notes (Signed)
PT was scheduled for ov with Dr. Gwendolyn Grant.  according to nurse, pt was 30 min late and was not seen.  appt was changed to lab visit and urine pregnancy was done which was negative.

## 2012-03-01 NOTE — Telephone Encounter (Signed)
Patient with LMP Nov 14, wanted to be checked for pregnancy test.  Had some minor bleeding early January for 2 days, then another episode of bleeding Jan 8 for several days. Some abdominal cramping as well.  Relayed negative pregnancy test. Recommended she come back to be seen, pt will call back front office to make an appt for tomorrow.

## 2012-03-03 ENCOUNTER — Telehealth: Payer: Self-pay | Admitting: *Deleted

## 2012-03-03 ENCOUNTER — Encounter: Payer: Self-pay | Admitting: Family Medicine

## 2012-03-03 ENCOUNTER — Ambulatory Visit (INDEPENDENT_AMBULATORY_CARE_PROVIDER_SITE_OTHER): Payer: Medicaid Other | Admitting: Family Medicine

## 2012-03-03 VITALS — BP 137/82 | HR 64 | Ht 61.0 in | Wt 222.3 lb

## 2012-03-03 DIAGNOSIS — N912 Amenorrhea, unspecified: Secondary | ICD-10-CM

## 2012-03-03 LAB — TSH: TSH: 1.91 u[IU]/mL (ref 0.350–4.500)

## 2012-03-03 NOTE — Telephone Encounter (Addendum)
Patient states last period started 12/24/2011 and lasted 7 days. Had an appointment here on 01/21 but was late and was not seen but urine preg test was done and was negative.    States she started bleeding around 11:00 today and has changed  tampon three times. Having much abdominal pain . Ibuprofen has not helped. Consulted with Dr. [place] and Mauricio Po and they advised to have patient come to office today.  Appointment scheduled at 3:15 .  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 She is at work now and may be  3:30 before she gets here. Advised to come no later than 3:30

## 2012-03-03 NOTE — Patient Instructions (Addendum)
Although I don't think your pregnant I think it would be safe to take a pregnancy test again at home in one week I am checking your thyroid today, I will let you know if this is abnormal   Metrorrhagia  Metrorrhagia is uterine bleeding at irregular intervals, especially between menstrual periods.  CAUSES   Dysfunctional uterine bleeding.  Uterine lining growing outside the uterus (endometriosis).  Embryo adhering to uterine wall (implantation).  Pregnancy growing in the fallopian tubes (ectopic pregnancy).  Miscarriage.  Menopause.  Cancer of the reproduction organs.  Certain drugs such as hormonal contraceptives.  Inherited bleeding disorders.  Trauma.  Uterine fibroids.  Sexually transmitted diseases (STDs).  Polycystic ovarian disease. DIAGNOSIS  A history will be taken.  A physical exam will be performed.  Other tests may include:  Blood tests.  A pregnancy test.  An ultrasound of the abdomen and pelvis.  A biopsy of the uterine lining.  AMRI or CT scan of the abdomen and pelvis. TREATMENT Treatment will depend on the cause. HOME CARE INSTRUCTIONS   Take all medicines as directed by your caregiver. Do not change or switch medicines without talking to your caregiver.  Take all iron supplements exactly as directed by your caregiver. Iron supplements help to replace the iron your body loses from irregular bleeding.If you become constipated, increase the amount of fiber, fruits, and vegetables in your diet.  Do not take aspirin or medicines that contain aspirin for 1 week before your menstrual period or during your menstrual period. Aspirin may increase the bleeding.  Rest as much as possible if you change your sanitary pad or tampon more than once every 2 hours.  Eat well-balanced meals including foods high in iron, such as green leafy vegetables, red meat, liver, eggs, and whole-grain breads and cereals.  Do not try to lose weight until the abnormal  bleeding is controlled and your blood iron level is back to normal. SEEK MEDICAL CARE IF:   You have nausea and vomiting, or you cannot keep foods down.  You feel dizzy or have diarrhea while taking medicine.  You have any problems that may be related to the medicine you are taking. SEEK IMMEDIATE MEDICAL CARE IF:   You have a fever.  You develop chills.  You become lightheaded or faint.  You need to change your sanitary pad or tampon more than once an hour.  Your bleeding becomesheavy.  You begin to pass clots or tissue. MAKE SURE YOU:   Understand these instructions.  Will watch your condition.  Will get help right away if you are not doing well or get worse. Document Released: 01/26/2005 Document Revised: 04/20/2011 Document Reviewed: 08/25/2010 [facility] Patient Information 2013 [place], [place].

## 2012-03-06 NOTE — Progress Notes (Signed)
  Subjective:    Patient ID: Carolyn Alvarez, female    DOB: [birth year], 22 y.o.   MRN: [id number]  HPI 1.  Irregular period:  Reports irregular period over the past two months.  States that last normal menstrual period 11/14 with a 35 day cycle.   She then had light bleeding from 1/2-1/3 then again on 1/7.  Today she states she had heavier bleeding and cramping that felt like a normal period, however this only lasted for 1 hour.  She has had three negative home pregnancy tests and another negative test in our clinic on 1/21.     Review of Systems Denies nausea or vomiting, abdominal pain currently, dizziness with standing   Objective:   Physical Exam  Constitutional:       obese female, nad  HENT:  Head: Normocephalic and atraumatic.  Neck: Neck supple. No thyromegaly present.  Cardiovascular: Normal rate and regular rhythm.   Abdominal: Soft. She exhibits no distension. There is no tenderness.  Genitourinary: Vagina normal. There is no lesion on the right labia. There is no lesion on the left labia. Cervix exhibits no motion tenderness and no discharge. Right adnexum displays no tenderness and no fullness. Left adnexum displays no tenderness and no fullness. No bleeding around the vagina.          Assessment & Plan:

## 2012-03-06 NOTE — Assessment & Plan Note (Signed)
Possibly due to anovulatory bleeding due to obesity.  Multiple negative pregnancy tests, advised to take one more in one week to be sure. Will check TSH today as well.  Given red flags to prompt return (see AVS)

## 2012-06-30 ENCOUNTER — Encounter (HOSPITAL_COMMUNITY): Payer: Self-pay | Admitting: *Deleted

## 2012-06-30 ENCOUNTER — Emergency Department (HOSPITAL_COMMUNITY)
Admission: EM | Admit: 2012-06-30 | Discharge: 2012-07-01 | Disposition: A | Discharge status: Home / Self Care | Payer: Medicaid Other | Attending: Emergency Medicine | Admitting: Emergency Medicine

## 2012-06-30 ENCOUNTER — Other Ambulatory Visit: Payer: Self-pay

## 2012-06-30 DIAGNOSIS — Z872 Personal history of diseases of the skin and subcutaneous tissue: Secondary | ICD-10-CM | POA: Insufficient documentation

## 2012-06-30 DIAGNOSIS — R12 Heartburn: Secondary | ICD-10-CM | POA: Insufficient documentation

## 2012-06-30 DIAGNOSIS — R0602 Shortness of breath: Secondary | ICD-10-CM | POA: Insufficient documentation

## 2012-06-30 DIAGNOSIS — E669 Obesity, unspecified: Secondary | ICD-10-CM | POA: Insufficient documentation

## 2012-06-30 DIAGNOSIS — Z79899 Other long term (current) drug therapy: Secondary | ICD-10-CM | POA: Insufficient documentation

## 2012-06-30 DIAGNOSIS — K219 Gastro-esophageal reflux disease without esophagitis: Secondary | ICD-10-CM

## 2012-06-30 DIAGNOSIS — R0789 Other chest pain: Secondary | ICD-10-CM

## 2012-06-30 DIAGNOSIS — R071 Chest pain on breathing: Secondary | ICD-10-CM | POA: Insufficient documentation

## 2012-06-30 MED ORDER — GI COCKTAIL ~~LOC~~
30.0000 mL | Freq: Once | ORAL | Status: AC
  Administered 2012-07-01: 30 mL via ORAL
  Filled 2012-06-30: qty 30

## 2012-06-30 MED ORDER — FAMOTIDINE 20 MG PO TABS
20.0000 mg | ORAL_TABLET | Freq: Once | ORAL | Status: AC
  Administered 2012-07-01: 20 mg via ORAL
  Filled 2012-06-30: qty 1

## 2012-06-30 MED ORDER — IBUPROFEN 800 MG PO TABS
800.0000 mg | ORAL_TABLET | Freq: Once | ORAL | Status: AC
  Administered 2012-07-01: 800 mg via ORAL
  Filled 2012-06-30: qty 1

## 2012-06-30 NOTE — ED Provider Notes (Signed)
History     CSN: [id number]  Arrival date & time 06/30/12  2250   First MD Initiated Contact with Patient 06/30/12 2323      Chief Complaint  Patient presents with  . Chest Pain    (Consider location/radiation/quality/duration/timing/severity/associated sxs/prior treatment) HPI Comments: 22 year old female no significant past medical history presents to the emergency department complaining of chest pain beginning around 11:30 PM last night after eating fried chicken from [employer]. Describes the pain as a burning sensation rated 10 out of 10. She thought it was heartburn initially, went home and leg down, woke up with the pain still present. Then took 2 over-the-counter antacids without relief, however did get relief from ibuprofen around 10:00 this morning. After falling back asleep during the day, chest pain returned she woke up a few hours prior to arrival after eating dinner out at the steakhouse. Before she took ibuprofen, she states she had about 15 episodes of intermittent left arm pain described as a prickly feeling lasting less than a second for each episode. Whenever she has the chest pain she gets associated shortness of breath. Denies associated nausea, vomiting or diaphoresis. No fever or chills.  Patient is a 22 y.o. female presenting with chest pain. The history is provided by the patient and a significant other.  Chest Pain Associated symptoms: shortness of breath   Associated symptoms: no abdominal pain, no back pain, no diaphoresis, no dizziness, no headache, no nausea and not vomiting     Past Medical History  Diagnosis Date  . Eczema     History reviewed. No pertinent past surgical history.  History reviewed. No pertinent family history.  History  Substance Use Topics  . Smoking status: Never Smoker   . Smokeless tobacco: Never Used  . Alcohol Use: Yes    OB History   Grav Para Term Preterm Abortions TAB SAB Ect Mult Living                  Review of  Systems  Constitutional: Negative for diaphoresis.  Respiratory: Positive for shortness of breath.   Cardiovascular: Positive for chest pain.  Gastrointestinal: Negative for nausea, vomiting and abdominal pain.  Musculoskeletal: Negative for back pain.  Neurological: Negative for dizziness, light-headedness and headaches.  All other systems reviewed and are negative.    Allergies  Citrus  Home Medications   Current Outpatient Rx  Name  Route  Sig  Dispense  Refill  . cyclobenzaprine (FLEXERIL) 5 MG tablet   Oral   Take 1 tablet (5 mg total) by mouth 3 (three) times daily as needed for muscle spasms.   30 tablet   1   . ibuprofen (ADVIL,MOTRIN) 600 MG tablet   Oral   Take 1 tablet (600 mg total) by mouth every 6 (six) hours as needed for pain.   30 tablet   0     BP 126/80  Pulse 80  Temp(Src) 98.4 F (36.9 C) (Oral)  Resp 16  Ht 5\' 1"  (1.549 m)  Wt 225 lb (102.059 kg)  BMI 42.54 kg/m2  SpO2 99%  Physical Exam  Nursing note and vitals reviewed. Constitutional: She is oriented to person, place, and time. She appears well-developed. No distress.  Obese  HENT:  Head: Normocephalic and atraumatic.  Mouth/Throat: Oropharynx is clear and moist.  Eyes: Conjunctivae and EOM are normal. Pupils are equal, round, and reactive to light.  Neck: Normal range of motion. Neck supple.  Cardiovascular: Normal rate, regular rhythm, normal heart sounds  and intact distal pulses.   Pulmonary/Chest: Effort normal and breath sounds normal. No respiratory distress. She has no decreased breath sounds. She has no wheezes. She has no rhonchi. She has no rales. She exhibits tenderness.    Abdominal: Soft. Bowel sounds are normal. She exhibits no distension. There is no tenderness.  Musculoskeletal: Normal range of motion. She exhibits no edema.  Neurological: She is alert and oriented to person, place, and time.  Skin: Skin is warm and dry. No rash noted. She is not diaphoretic.    Psychiatric: She has a normal mood and affect. Her behavior is normal.    ED Course  Procedures (including critical care time)  Labs Reviewed - No data to display No results found.   Date: 06/30/2012  Rate: 76  Rhythm: normal sinus rhythm  QRS Axis: normal  Intervals: normal  ST/T Wave abnormalities: normal  Conduction Disutrbances:none  Narrative Interpretation: normal EKG  Old EKG Reviewed: none available   1. Acid reflux   2. Chest wall pain       MDM  22 y/o female with heartburn and chest wall pain. Burning sensation present after eating fried food and steak. Burning sensation subsided with pepcid and GI cocktail. Chest wall pain relieved by ibuprofen at home and again in the ED. Vitals stable, physical exam unremarkable other then chest wall tenderness on left. EKG normal. No concern for cardiac or pulmonary origin of patient's chest pain. No significant personal or family medical history. She is stable for discharge. Rx pepcid and ibuprofen. Return precautions discussed. Patient states understanding of plan and is agreeable.       Trevor Mace, PA-C 07/01/12 [phone]

## 2012-06-30 NOTE — ED Notes (Signed)
Pt c/o chest pain since 2330 last night. Pt thought is was heart burn since chest pain started right after a meal. Pt woke up this morning with continued chest pain. Pt took ibuprofen at 1000 this morning, pt fell back asleep, woke up later with continued chest pain. Pt reports chest pain is in her epigastric region. Pt states yesterday the pain was in left chest and left arm. Pt reports the pain as stabbing associated with shortness of breath. Pt is A&Ox4, respirations are equal and unlabored, skin is warm and dry.

## 2012-07-01 MED ORDER — IBUPROFEN 600 MG PO TABS: 600.0000 mg | ORAL_TABLET | Freq: Four times a day (QID) | ORAL | Status: DC | PRN

## 2012-07-01 MED ORDER — FAMOTIDINE 20 MG PO TABS: 20.0000 mg | ORAL_TABLET | Freq: Two times a day (BID) | ORAL | Status: DC

## 2012-07-01 NOTE — ED Notes (Signed)
Patient is alert and oriented x3.  She was given DC instructions and follow up visit instructions.  Patient gave verbal understanding. She was DC ambulatory under her own power to home.  V/S stable.  He was not showing any signs of distress on DC 

## 2012-07-01 NOTE — ED Provider Notes (Signed)
Medical screening examination/treatment/procedure(s) were performed by non-physician practitioner and as supervising physician I was immediately available for consultation/collaboration.   Derel Mcglasson L Celestina Gironda, MD 07/01/12 0625 

## 2013-01-11 ENCOUNTER — Ambulatory Visit (INDEPENDENT_AMBULATORY_CARE_PROVIDER_SITE_OTHER): Payer: Medicaid Other | Admitting: Family Medicine

## 2013-01-11 ENCOUNTER — Encounter: Payer: Self-pay | Admitting: Family Medicine

## 2013-01-11 ENCOUNTER — Other Ambulatory Visit (HOSPITAL_COMMUNITY)
Admission: RE | Admit: 2013-01-11 | Discharge: 2013-01-11 | Disposition: A | Discharge status: Home / Self Care | Payer: Medicaid Other | Source: Ambulatory Visit | Attending: Family Medicine | Admitting: Family Medicine

## 2013-01-11 VITALS — BP 118/79 | HR 68 | Temp 98.4°F | Ht 61.0 in | Wt 231.0 lb

## 2013-01-11 DIAGNOSIS — N915 Oligomenorrhea, unspecified: Secondary | ICD-10-CM

## 2013-01-11 DIAGNOSIS — N926 Irregular menstruation, unspecified: Secondary | ICD-10-CM

## 2013-01-11 DIAGNOSIS — Z113 Encounter for screening for infections with a predominantly sexual mode of transmission: Secondary | ICD-10-CM | POA: Insufficient documentation

## 2013-01-11 DIAGNOSIS — Z202 Contact with and (suspected) exposure to infections with a predominantly sexual mode of transmission: Secondary | ICD-10-CM

## 2013-01-11 DIAGNOSIS — R300 Dysuria: Secondary | ICD-10-CM

## 2013-01-11 DIAGNOSIS — Z23 Encounter for immunization: Secondary | ICD-10-CM

## 2013-01-11 DIAGNOSIS — Z20828 Contact with and (suspected) exposure to other viral communicable diseases: Secondary | ICD-10-CM

## 2013-01-11 DIAGNOSIS — N898 Other specified noninflammatory disorders of vagina: Secondary | ICD-10-CM

## 2013-01-11 LAB — POCT URINALYSIS DIPSTICK
Leukocytes, UA: NEGATIVE
Protein, UA: NEGATIVE
Spec Grav, UA: 1.025
pH, UA: 6

## 2013-01-11 LAB — POCT WET PREP (WET MOUNT)

## 2013-01-11 LAB — POCT UA - MICROSCOPIC ONLY

## 2013-01-11 MED ORDER — NORGESTIMATE-ETH ESTRADIOL 0.25-35 MG-MCG PO TABS: 1.0000 | ORAL_TABLET | Freq: Every day | ORAL | Status: DC

## 2013-01-11 NOTE — Patient Instructions (Signed)
You need to go to lab for HIV/syphilis testing.   I will call you today about urine and wet prep but other tests may be up to a week. For your labs, I will send you a letter if there are no medication changes needed. I will call you if we need to discuss your lab results.   We started you on birth control (as long as urine pregnancy is negative). Start on Sunday. Always use protection with condoms. This should help with your heavy and missed periods.   Thanks, Dr. Durene Cal  See mea t least yearly

## 2013-01-11 NOTE — Addendum Note (Signed)
Addended by: [place], Katiana Ruland on: 01/11/2013 06:05 PM   Modules accepted: Orders

## 2013-01-11 NOTE — Progress Notes (Signed)
  Carolyn Conch, MD Phone: [phone]  Subjective:  Chief complaint-noted  # Irregular and heavy periods Patient with a long history of irregular periods. States period average about every 45-50 days and are heavy and last 7 days when they occur. Has been seen in past and had negative TSH. Has had multiple negative pregnancy tests as well. She desires to be on birth control to help her be more regular and to have less heavy, painful periods. She was told she had an allergic reaction to implanon. She does not like shots so wants to avoid depo-provera. She states nuva ring falls out. Has heard "bad things" about IUD and is not interested. LMP 21/1/14 ROS-no lightheadedness, dizziness, chest pain (did have this over a year ago). No fever/chills.   # vaginal discharge/dysuria 2 weeks of discharge-yellow. Tried a [facility] medicine for vaginal discharge as well as for UTI (AZO). History of bacterial vaginosis she thinks. She states her itching is slightly better but persists and discharge persists.  ROS-occasiaonal dysuria/ no back pain/no fevers  Past Medical History Patient Active Problem List   Diagnosis Date Noted  . DEPRESSIVE DISORDER 11/27/2008    Priority: Medium  . Oligomenorrhea 03/13/2011    Priority: Low  . ECZEMA, ATOPIC 11/27/2008    Priority: Low    Medications- reviewed and updated Current Outpatient Prescriptions  Medication Sig Dispense Refill  . famotidine (PEPCID) 20 MG tablet Take 1 tablet (20 mg total) by mouth 2 (two) times daily.  30 tablet  0  . fexofenadine (ALLEGRA) 180 MG tablet Take 180 mg by mouth daily.      Marland Kitchen ibuprofen (ADVIL,MOTRIN) 600 MG tablet Take 1 tablet (600 mg total) by mouth every 6 (six) hours as needed for pain.  30 tablet  0  . lansoprazole (PREVACID) 15 MG capsule Take 15 mg by mouth daily.      . norgestimate-ethinyl estradiol (SPRINTEC 28) 0.25-35 MG-MCG tablet Take 1 tablet by mouth daily.  1 Package  11   No current  facility-administered medications for this visit.    Objective: BP 118/79  Pulse 68  Temp(Src) 98.4 F (36.9 C) (Oral)  Ht 5\' 1"  (1.549 m)  Wt 231 lb (104.781 kg)  BMI 43.67 kg/m2  LMP 01/09/2013 Gen: NAD, resting comfortably on table Pelvic: cervix normal in appearance, external genitalia normal, no adnexal masses or tenderness, no cervical motion tenderness, uterus normal size, shape, and consistency and vagina currently with menses noted from cervix. No obvious discharge.   CV: RRR no murmurs rubs or gallops Lungs: CTAB no crackles, wheeze, rhonchi Skin: warm, dry Neuro: grossly normal, moves all extremities  Assessment/Plan:  Oligomenorrhea Suspect patient is anovulatory due to either obesity or PCOS. Advised regular exercise and weight loss. Given heavy periods when they do occur, suspect breakthrough bleeding. Will start patient on sprintec to regulate periods (per request) and also to avoid unopposed estrogen in anovulatory state.   Discharge/dysuria Orders as below. Investigate STDs as last screen over 2 years ago. Will call patient with results.   Orders Placed This Encounter  Procedures  . HIV antibody  . RPR  . POCT urine pregnancy  . POCT Urinalysis Dipstick  . POCT Wet Prep [facility])    Meds ordered this encounter  Medications  . norgestimate-ethinyl estradiol (SPRINTEC 28) 0.25-35 MG-MCG tablet    Sig: Take 1 tablet by mouth daily.    Dispense:  1 Package    Refill:  11

## 2013-01-11 NOTE — Assessment & Plan Note (Signed)
Suspect patient is anovulatory due to either obesity or PCOS. Advised regular exercise and weight loss. Given heavy periods when they do occur, suspect breakthrough bleeding. Will start patient on sprintec to regulate periods (per request) and also to avoid unopposed estrogen in anovulatory state.

## 2013-01-12 ENCOUNTER — Telehealth: Payer: Self-pay | Admitting: Family Medicine

## 2013-01-12 ENCOUNTER — Encounter: Payer: Self-pay | Admitting: Family Medicine

## 2013-01-12 DIAGNOSIS — N390 Urinary tract infection, site not specified: Secondary | ICD-10-CM

## 2013-01-12 DIAGNOSIS — Z8619 Personal history of other infectious and parasitic diseases: Secondary | ICD-10-CM | POA: Insufficient documentation

## 2013-01-12 DIAGNOSIS — A599 Trichomoniasis, unspecified: Secondary | ICD-10-CM

## 2013-01-12 DIAGNOSIS — B9689 Other specified bacterial agents as the cause of diseases classified elsewhere: Secondary | ICD-10-CM

## 2013-01-12 MED ORDER — CEPHALEXIN 500 MG PO CAPS: 500.0000 mg | ORAL_CAPSULE | Freq: Four times a day (QID) | ORAL | Status: DC

## 2013-01-12 MED ORDER — METRONIDAZOLE 500 MG PO TABS: 500.0000 mg | ORAL_TABLET | Freq: Two times a day (BID) | ORAL | Status: DC

## 2013-01-12 NOTE — Telephone Encounter (Signed)
Discussed BV, UTI, and trichomonas diagnosis with patient by phone. Will treat for both with flagyl and keflex. Advised no sex until 7 days after treatment if asymptomatic. Use protection. No alcohol on flagyl.

## 2013-06-20 ENCOUNTER — Ambulatory Visit: Payer: Medicaid Other | Admitting: Family Medicine

## 2013-06-21 ENCOUNTER — Encounter: Payer: Self-pay | Admitting: Family Medicine

## 2013-06-21 ENCOUNTER — Ambulatory Visit (INDEPENDENT_AMBULATORY_CARE_PROVIDER_SITE_OTHER): Payer: Medicaid Other | Admitting: Family Medicine

## 2013-06-21 VITALS — BP 112/78 | HR 67 | Temp 97.9°F | Resp 20 | Ht 62.0 in | Wt 224.2 lb

## 2013-06-21 DIAGNOSIS — J302 Other seasonal allergic rhinitis: Secondary | ICD-10-CM | POA: Insufficient documentation

## 2013-06-21 DIAGNOSIS — J309 Allergic rhinitis, unspecified: Secondary | ICD-10-CM

## 2013-06-21 MED ORDER — FLUTICASONE PROPIONATE 50 MCG/ACT NA SUSP: 2.0000 | Freq: Every day | NASAL | Status: DC

## 2013-06-21 MED ORDER — CETIRIZINE HCL 10 MG PO TABS: 10.0000 mg | ORAL_TABLET | Freq: Every day | ORAL | Status: DC

## 2013-06-21 NOTE — Assessment & Plan Note (Signed)
Most likely the cause of her upper respiratory symptoms. Examen and vitals are benign with no signs of toxemia. will treat with topical steroid and antihistaminic and f/u as needed. Discussed signs of worsening condition that should prompt re-evaluation.

## 2013-06-21 NOTE — Patient Instructions (Signed)
It has been a pleasure to see you today. Please take the medications as prescribed. Make your next appointment in 1-2 weeks for follow up.

## 2013-06-21 NOTE — Progress Notes (Signed)
Family Medicine Office Visit Note   Subjective:   Patient ID: Carolyn Alvarez, female  DOB: 1991/01/11, 23 y.o.. MRN: [id number]   Pt that comes today complaining of respiratory symptoms since February. She reports was sick at that point and she has continued with nasal discharge and cough since then. Nasal discharge is white and sometimes yellowish and cough is non productive. Denies fevers, chills, headaches, nausea, vomiting or systemic symptoms. She reports no recent sick contacts but has had seasonal allergies in the past and she is  not taking any medication for this at this time.   Review of Systems:  Per HPI  Objective:   Physical Exam: Gen: obese,  NAD HEENT: Moist mucous membranes. Oropharynx w/o erythema or exudates. Postnasal drainage seen.  Nose with clear rhinorrhea. No masses, pale turbinates mucosa.  Ears: normal ear canal and TM bl. CV: Regular rate and rhythm, no murmurs rubs or gallops PULM: Clear to auscultation bilaterally. No wheezes/rales/rhonchi EXT: No edema Neuro: Alert and oriented x3. No focalization  Assessment & Plan:

## 2013-10-23 ENCOUNTER — Ambulatory Visit: Payer: Medicaid Other | Admitting: Family Medicine

## 2014-07-19 ENCOUNTER — Encounter: Payer: Medicaid Other | Admitting: Family Medicine

## 2014-07-30 ENCOUNTER — Ambulatory Visit (INDEPENDENT_AMBULATORY_CARE_PROVIDER_SITE_OTHER): Payer: Medicaid Other | Admitting: Family Medicine

## 2014-07-30 ENCOUNTER — Other Ambulatory Visit (HOSPITAL_COMMUNITY)
Admission: RE | Admit: 2014-07-30 | Discharge: 2014-07-30 | Disposition: A | Discharge status: Home / Self Care | Payer: Medicaid Other | Source: Ambulatory Visit | Attending: Family Medicine | Admitting: Family Medicine

## 2014-07-30 VITALS — BP 115/57 | HR 74 | Temp 98.6°F | Ht 62.0 in | Wt 225.4 lb

## 2014-07-30 DIAGNOSIS — Z124 Encounter for screening for malignant neoplasm of cervix: Secondary | ICD-10-CM

## 2014-07-30 DIAGNOSIS — Z01419 Encounter for gynecological examination (general) (routine) without abnormal findings: Secondary | ICD-10-CM | POA: Insufficient documentation

## 2014-07-30 DIAGNOSIS — N898 Other specified noninflammatory disorders of vagina: Secondary | ICD-10-CM | POA: Diagnosis not present

## 2014-07-30 DIAGNOSIS — Z113 Encounter for screening for infections with a predominantly sexual mode of transmission: Secondary | ICD-10-CM | POA: Diagnosis present

## 2014-07-30 DIAGNOSIS — O99013 Anemia complicating pregnancy, third trimester: Secondary | ICD-10-CM | POA: Insufficient documentation

## 2014-07-30 DIAGNOSIS — D649 Anemia, unspecified: Secondary | ICD-10-CM | POA: Diagnosis present

## 2014-07-30 DIAGNOSIS — D509 Iron deficiency anemia, unspecified: Secondary | ICD-10-CM | POA: Insufficient documentation

## 2014-07-30 LAB — CBC
HCT: 30.9 % — ABNORMAL LOW (ref 36.0–46.0)
HEMOGLOBIN: 9.9 g/dL — AB (ref 12.0–15.0)
MCH: 26.8 pg (ref 26.0–34.0)
MCHC: 32 g/dL (ref 30.0–36.0)
MCV: 83.5 fL (ref 78.0–100.0)
MPV: 8.3 fL — AB (ref 8.6–12.4)
Platelets: 422 10*3/uL — ABNORMAL HIGH (ref 150–400)
RBC: 3.7 MIL/uL — ABNORMAL LOW (ref 3.87–5.11)
RDW: 15.2 % (ref 11.5–15.5)
WBC: 7.8 10*3/uL (ref 4.0–10.5)

## 2014-07-30 LAB — POCT WET PREP (WET MOUNT): Clue Cells Wet Prep Whiff POC: POSITIVE

## 2014-07-30 NOTE — Patient Instructions (Signed)
It was great seeing you today.   1. I will call you with you test results in 2-3 days. Call if you have not heard from me by Thursday.   If you have any questions or concerns before then, please call the clinic at [phone].  Take Care,   Dr Phill Myron

## 2014-07-30 NOTE — Progress Notes (Signed)
  Patient name: Carolyn Alvarez MRN [id number]  Date of birth: [birth year]  CC & HPI:  Carolyn Alvarez is a 24 y.o. female presenting today for Pap smear.  She currently denies any vaginal symptoms; no discharge, bleeding, pain, odor.  She is currently sexually active with one partner.  Not currently using birth control.  Reports partner has vasectomy.  Denies history of STDs.  Reports mostly regular periods, they can be heavy, requiring greater than 10 pads on day 1 and occasionally bleeds through clothes.  Denies any chest pain, shortness of breath, dizziness, fatigue.  LMP: 07/18/14  ROS: See HPI   Medical & Surgical Hx:  Reviewed  Medications & Allergies: Reviewed  Social History: Reviewed:   Objective Findings:  Vitals: BP 115/57 mmHg  Pulse 74  Temp(Src) 98.6 F (37 C) (Oral)  Ht 5\' 2"  (1.575 m)  Wt 225 lb 7 oz (102.258 kg)  BMI 41.22 kg/m2  LMP 07/18/2014  Gen: NAD Speculum Exam: Ext genitalia: wnl; Vaginal discharge: thin, white; Cervix: Few petechiae, easily friable with pap Bimanual Exam: No Cervical motion tenderness; No Vaginal wall defects; Adnexa nontender  Assessment & Plan:   Please See Problem Focused Assessment & Plan  History of anemia - Check CBC - Recommended taking prenatal vitamins  Cervical cancer screening - Pap completed  Vaginal discharge - Asymptomatic - Check wet prep, GC chlamydia

## 2014-07-31 ENCOUNTER — Telehealth: Payer: Self-pay | Admitting: Family Medicine

## 2014-07-31 LAB — CERVICOVAGINAL ANCILLARY ONLY
Chlamydia: NEGATIVE
NEISSERIA GONORRHEA: POSITIVE — AB

## 2014-07-31 NOTE — Telephone Encounter (Signed)
Called to discuss positive test results (Trich, GC) with patient, but no answer. If she calls please let me know what is the best number and time to call and discuss this with her.

## 2014-08-01 ENCOUNTER — Ambulatory Visit (INDEPENDENT_AMBULATORY_CARE_PROVIDER_SITE_OTHER): Payer: Medicaid Other | Admitting: *Deleted

## 2014-08-01 ENCOUNTER — Telehealth: Payer: Self-pay | Admitting: Family Medicine

## 2014-08-01 DIAGNOSIS — A599 Trichomoniasis, unspecified: Secondary | ICD-10-CM | POA: Insufficient documentation

## 2014-08-01 DIAGNOSIS — Z202 Contact with and (suspected) exposure to infections with a predominantly sexual mode of transmission: Secondary | ICD-10-CM

## 2014-08-01 DIAGNOSIS — A549 Gonococcal infection, unspecified: Secondary | ICD-10-CM

## 2014-08-01 DIAGNOSIS — Z8619 Personal history of other infectious and parasitic diseases: Secondary | ICD-10-CM

## 2014-08-01 HISTORY — DX: Personal history of other infectious and parasitic diseases: Z86.19

## 2014-08-01 LAB — CYTOLOGY - PAP

## 2014-08-01 MED ORDER — CEFTRIAXONE SODIUM 250 MG IJ SOLR: 250.0000 mg | Freq: Once | INTRAMUSCULAR | Status: DC

## 2014-08-01 MED ORDER — AZITHROMYCIN 500 MG PO TABS
1000.0000 mg | ORAL_TABLET | Freq: Once | ORAL | Status: AC
  Administered 2014-08-01: 1000 mg via ORAL

## 2014-08-01 MED ORDER — METRONIDAZOLE 500 MG PO TABS: 500.0000 mg | ORAL_TABLET | Freq: Two times a day (BID) | ORAL | Status: DC

## 2014-08-01 MED ORDER — PRENATAL VITAMINS 28-0.8 MG PO TABS: 1.0000 | ORAL_TABLET | Freq: Every day | ORAL | Status: DC

## 2014-08-01 MED ORDER — CEFTRIAXONE SODIUM 250 MG IJ SOLR
250.0000 mg | Freq: Once | INTRAMUSCULAR | Status: AC
  Administered 2014-08-01: 250 mg via INTRAMUSCULAR

## 2014-08-01 NOTE — Progress Notes (Signed)
   Pt in nurse clinic for gonorrhea treatment.  Azithromycin 1 GM PO and Rocephin 250 mg IM given x 1 per verbal order by Dr. Berkley Harvey.  Pt advised to follow up in 1-3 months for retesting.

## 2014-08-01 NOTE — Telephone Encounter (Signed)
Called and informed patient of positive Gonorrhea and Trich infection results. Advised her to call and make nurse visit for to get antibiotics (CTX 250 * Azithro 1Gram) as well as have blood work for HIV and RPR. Also informed her that I called in prescription for Flagyl; She should not have sex for 7 days while being treated; She should inform her partners of the need to be tested and treated for STDs; and She should be retested for STDs in 1-3 months. Also informed patient of anemia and need to take prenatal vitamins.

## 2014-08-01 NOTE — Telephone Encounter (Signed)
Pt is aware of results.  Please see other telephone note. Amena Dockham,CMA

## 2014-08-02 ENCOUNTER — Encounter: Payer: Self-pay | Admitting: Family Medicine

## 2014-08-02 LAB — HIV ANTIBODY (ROUTINE TESTING W REFLEX): HIV 1&2 Ab, 4th Generation: NONREACTIVE

## 2014-08-02 LAB — RPR

## 2014-11-02 ENCOUNTER — Ambulatory Visit: Payer: Medicaid Other | Admitting: Family Medicine

## 2015-01-12 ENCOUNTER — Emergency Department
Admission: EM | Admit: 2015-01-12 | Discharge: 2015-01-12 | Disposition: A | Discharge status: Home / Self Care | Payer: Medicaid Other | Attending: Emergency Medicine | Admitting: Emergency Medicine

## 2015-01-12 ENCOUNTER — Emergency Department: Payer: Medicaid Other

## 2015-01-12 ENCOUNTER — Encounter: Payer: Self-pay | Admitting: General Practice

## 2015-01-12 DIAGNOSIS — Y92410 Unspecified street and highway as the place of occurrence of the external cause: Secondary | ICD-10-CM | POA: Insufficient documentation

## 2015-01-12 DIAGNOSIS — S71112A Laceration without foreign body, left thigh, initial encounter: Secondary | ICD-10-CM | POA: Diagnosis present

## 2015-01-12 DIAGNOSIS — Z79899 Other long term (current) drug therapy: Secondary | ICD-10-CM | POA: Diagnosis not present

## 2015-01-12 DIAGNOSIS — T148XXA Other injury of unspecified body region, initial encounter: Secondary | ICD-10-CM

## 2015-01-12 DIAGNOSIS — Y998 Other external cause status: Secondary | ICD-10-CM | POA: Insufficient documentation

## 2015-01-12 DIAGNOSIS — Z3202 Encounter for pregnancy test, result negative: Secondary | ICD-10-CM | POA: Diagnosis not present

## 2015-01-12 DIAGNOSIS — Y9389 Activity, other specified: Secondary | ICD-10-CM | POA: Insufficient documentation

## 2015-01-12 DIAGNOSIS — S20211A Contusion of right front wall of thorax, initial encounter: Secondary | ICD-10-CM | POA: Insufficient documentation

## 2015-01-12 DIAGNOSIS — IMO0002 Reserved for concepts with insufficient information to code with codable children: Secondary | ICD-10-CM

## 2015-01-12 DIAGNOSIS — F419 Anxiety disorder, unspecified: Secondary | ICD-10-CM | POA: Diagnosis not present

## 2015-01-12 LAB — CBC WITH DIFFERENTIAL/PLATELET
BASOS ABS: 0 10*3/uL (ref 0–0.1)
BASOS PCT: 0 %
Eosinophils Absolute: 0 10*3/uL (ref 0–0.7)
Eosinophils Relative: 0 %
HEMATOCRIT: 33.4 % — AB (ref 35.0–47.0)
HEMOGLOBIN: 10.8 g/dL — AB (ref 12.0–16.0)
LYMPHS PCT: 21 %
Lymphs Abs: 2.6 10*3/uL (ref 1.0–3.6)
MCH: 27.6 pg (ref 26.0–34.0)
MCHC: 32.2 g/dL (ref 32.0–36.0)
MCV: 85.6 fL (ref 80.0–100.0)
Monocytes Absolute: 0.5 10*3/uL (ref 0.2–0.9)
Monocytes Relative: 5 %
NEUTROS ABS: 8.8 10*3/uL — AB (ref 1.4–6.5)
NEUTROS PCT: 74 %
Platelets: 464 10*3/uL — ABNORMAL HIGH (ref 150–440)
RBC: 3.9 MIL/uL (ref 3.80–5.20)
RDW: 15 % — ABNORMAL HIGH (ref 11.5–14.5)
WBC: 12 10*3/uL — ABNORMAL HIGH (ref 3.6–11.0)

## 2015-01-12 LAB — URINE DRUG SCREEN, QUALITATIVE (ARMC ONLY)
Amphetamines, Ur Screen: NOT DETECTED
BARBITURATES, UR SCREEN: NOT DETECTED
BENZODIAZEPINE, UR SCRN: NOT DETECTED
Cannabinoid 50 Ng, Ur ~~LOC~~: NOT DETECTED
Cocaine Metabolite,Ur ~~LOC~~: NOT DETECTED
MDMA (Ecstasy)Ur Screen: NOT DETECTED
Methadone Scn, Ur: NOT DETECTED
OPIATE, UR SCREEN: NOT DETECTED
PHENCYCLIDINE (PCP) UR S: NOT DETECTED
Tricyclic, Ur Screen: NOT DETECTED

## 2015-01-12 LAB — COMPREHENSIVE METABOLIC PANEL
ALBUMIN: 3.8 g/dL (ref 3.5–5.0)
ALK PHOS: 93 U/L (ref 38–126)
ALT: 10 U/L — AB (ref 14–54)
AST: 17 U/L (ref 15–41)
Anion gap: 8 (ref 5–15)
BUN: 9 mg/dL (ref 6–20)
CALCIUM: 9.1 mg/dL (ref 8.9–10.3)
CO2: 29 mmol/L (ref 22–32)
CREATININE: 0.75 mg/dL (ref 0.44–1.00)
Chloride: 103 mmol/L (ref 101–111)
GFR calc Af Amer: >60 mL/min (ref >60)
GFR calc non Af Amer: >60 mL/min (ref >60)
GLUCOSE: 144 mg/dL — AB (ref 65–99)
POTASSIUM: 3.8 mmol/L (ref 3.5–5.1)
Sodium: 140 mmol/L (ref 135–145)
TOTAL PROTEIN: 8.5 g/dL — AB (ref 6.5–8.1)

## 2015-01-12 LAB — ETHANOL: ALCOHOL ETHYL (B): 127 mg/dL — AB (ref <5)

## 2015-01-12 LAB — POCT PREGNANCY, URINE: PREG TEST UR: NEGATIVE

## 2015-01-12 MED ORDER — LIDOCAINE HCL (PF) 1 % IJ SOLN
5.0000 mL | Freq: Once | INTRAMUSCULAR | Status: AC
  Administered 2015-01-12: 5 mL via INTRADERMAL

## 2015-01-12 MED ORDER — BACITRACIN ZINC 500 UNIT/GM EX OINT
TOPICAL_OINTMENT | CUTANEOUS | Status: AC
  Filled 2015-01-12: qty 0.9

## 2015-01-12 MED ORDER — IOHEXOL 350 MG/ML SOLN
100.0000 mL | Freq: Once | INTRAVENOUS | Status: AC | PRN
  Administered 2015-01-12: 100 mL via INTRAVENOUS

## 2015-01-12 MED ORDER — ONDANSETRON HCL 4 MG/2ML IJ SOLN
4.0000 mg | Freq: Once | INTRAMUSCULAR | Status: AC
  Administered 2015-01-12: 4 mg via INTRAVENOUS
  Filled 2015-01-12: qty 2

## 2015-01-12 MED ORDER — CEPHALEXIN 500 MG PO CAPS: 500.0000 mg | ORAL_CAPSULE | Freq: Three times a day (TID) | ORAL | Status: DC

## 2015-01-12 MED ORDER — SODIUM CHLORIDE 0.9 % IV BOLUS (SEPSIS)
1000.0000 mL | Freq: Once | INTRAVENOUS | Status: AC
  Administered 2015-01-12: 1000 mL via INTRAVENOUS

## 2015-01-12 MED ORDER — CEPHALEXIN 500 MG PO CAPS
500.0000 mg | ORAL_CAPSULE | Freq: Once | ORAL | Status: AC
  Administered 2015-01-12: 500 mg via ORAL
  Filled 2015-01-12: qty 1

## 2015-01-12 MED ORDER — HYDROCODONE-ACETAMINOPHEN 5-325 MG PO TABS: 1.0000 | ORAL_TABLET | Freq: Four times a day (QID) | ORAL | Status: DC | PRN

## 2015-01-12 MED ORDER — BUTAMBEN-TETRACAINE-BENZOCAINE 2-2-14 % EX AERO
INHALATION_SPRAY | CUTANEOUS | Status: AC
  Administered 2015-01-12: 06:00:00
  Filled 2015-01-12: qty 20

## 2015-01-12 MED ORDER — MORPHINE SULFATE (PF) 4 MG/ML IV SOLN
4.0000 mg | Freq: Once | INTRAVENOUS | Status: AC
  Administered 2015-01-12: 4 mg via INTRAVENOUS
  Filled 2015-01-12: qty 1

## 2015-01-12 MED ORDER — IBUPROFEN 800 MG PO TABS: 800.0000 mg | ORAL_TABLET | Freq: Three times a day (TID) | ORAL | Status: DC | PRN

## 2015-01-12 MED ORDER — LIDOCAINE HCL (PF) 1 % IJ SOLN
INTRAMUSCULAR | Status: AC
  Administered 2015-01-12: 5 mL via INTRADERMAL
  Filled 2015-01-12: qty 5

## 2015-01-12 MED ORDER — BACITRACIN ZINC 500 UNIT/GM EX OINT
TOPICAL_OINTMENT | Freq: Once | CUTANEOUS | Status: AC
  Administered 2015-01-12: 07:00:00 via TOPICAL
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
## 2015-01-12 NOTE — ED Notes (Signed)
Pt very agitated and resistant to IV, "boyfriend" called to room to calm pt and for pt to consent to IV
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
## 2015-01-23 ENCOUNTER — Encounter: Payer: Self-pay | Admitting: Family Medicine

## 2015-01-23 ENCOUNTER — Ambulatory Visit (INDEPENDENT_AMBULATORY_CARE_PROVIDER_SITE_OTHER): Payer: Medicaid Other | Admitting: Family Medicine

## 2015-01-23 DIAGNOSIS — M79672 Pain in left foot: Secondary | ICD-10-CM

## 2015-01-23 DIAGNOSIS — M542 Cervicalgia: Secondary | ICD-10-CM | POA: Diagnosis not present

## 2015-01-23 DIAGNOSIS — M25552 Pain in left hip: Secondary | ICD-10-CM | POA: Diagnosis present

## 2015-01-23 MED ORDER — IBUPROFEN 800 MG PO TABS: 800.0000 mg | ORAL_TABLET | Freq: Three times a day (TID) | ORAL | Status: DC | PRN

## 2015-01-23 MED ORDER — HYDROCODONE-ACETAMINOPHEN 5-325 MG PO TABS: 1.0000 | ORAL_TABLET | Freq: Four times a day (QID) | ORAL | Status: DC | PRN

## 2015-01-23 NOTE — Patient Instructions (Signed)
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
## 2015-02-15 ENCOUNTER — Ambulatory Visit: Payer: Medicaid Other | Admitting: Internal Medicine

## 2015-03-04 ENCOUNTER — Emergency Department (HOSPITAL_COMMUNITY)
Admission: EM | Admit: 2015-03-04 | Discharge: 2015-03-04 | Discharge status: Against Medical Advice | Payer: Medicaid Other | Attending: Emergency Medicine | Admitting: Emergency Medicine

## 2015-03-04 ENCOUNTER — Encounter (HOSPITAL_COMMUNITY): Payer: Self-pay | Admitting: Emergency Medicine

## 2015-03-04 ENCOUNTER — Emergency Department (HOSPITAL_COMMUNITY): Payer: Medicaid Other

## 2015-03-04 ENCOUNTER — Emergency Department (HOSPITAL_COMMUNITY)
Admission: EM | Admit: 2015-03-04 | Discharge: 2015-03-04 | Disposition: A | Discharge status: Home / Self Care | Payer: Medicaid Other | Attending: Emergency Medicine | Admitting: Emergency Medicine

## 2015-03-04 DIAGNOSIS — M79672 Pain in left foot: Secondary | ICD-10-CM | POA: Diagnosis present

## 2015-03-04 DIAGNOSIS — Y9389 Activity, other specified: Secondary | ICD-10-CM | POA: Diagnosis not present

## 2015-03-04 DIAGNOSIS — Y92410 Unspecified street and highway as the place of occurrence of the external cause: Secondary | ICD-10-CM | POA: Diagnosis not present

## 2015-03-04 DIAGNOSIS — Z792 Long term (current) use of antibiotics: Secondary | ICD-10-CM | POA: Diagnosis not present

## 2015-03-04 DIAGNOSIS — Z872 Personal history of diseases of the skin and subcutaneous tissue: Secondary | ICD-10-CM | POA: Diagnosis not present

## 2015-03-04 DIAGNOSIS — Z87828 Personal history of other (healed) physical injury and trauma: Secondary | ICD-10-CM | POA: Diagnosis not present

## 2015-03-04 DIAGNOSIS — Y998 Other external cause status: Secondary | ICD-10-CM | POA: Insufficient documentation

## 2015-03-04 DIAGNOSIS — S99922A Unspecified injury of left foot, initial encounter: Secondary | ICD-10-CM | POA: Diagnosis present

## 2015-03-04 MED ORDER — IBUPROFEN 800 MG PO TABS: 800.0000 mg | ORAL_TABLET | Freq: Three times a day (TID) | ORAL | Status: DC | PRN
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
## 2015-03-04 NOTE — Discharge Instructions (Signed)
Ms. Rosalind Kollin [place],  Nice meeting you! Please follow-up with orthopedics if you continue to have pain. Return to the emergency department if you develop increasing pain, swelling, color changes. Feel better soon!  S. Wendie Simmer, PA-C   Musculoskeletal Pain Musculoskeletal pain is muscle and boney aches and pains. These pains can occur in any part of the body. Your caregiver may treat you without knowing the cause of the pain. They may treat you if blood or urine tests, X-rays, and other tests were normal.  CAUSES There is often not a definite cause or reason for these pains. These pains may be caused by a type of germ (virus). The discomfort may also come from overuse. Overuse includes working out too hard when your body is not fit. Boney aches also come from weather changes. Bone is sensitive to atmospheric pressure changes. HOME CARE INSTRUCTIONS   Ask when your test results will be ready. Make sure you get your test results.  Only take over-the-counter or prescription medicines for pain, discomfort, or fever as directed by your caregiver. If you were given medications for your condition, do not drive, operate machinery or power tools, or sign legal documents for 24 hours. Do not drink alcohol. Do not take sleeping pills or other medications that may interfere with treatment.  Continue all activities unless the activities cause more pain. When the pain lessens, slowly resume normal activities. Gradually increase the intensity and duration of the activities or exercise.  During periods of severe pain, bed rest may be helpful. Lay or sit in any position that is comfortable.  Putting ice on the injured area.  Put ice in a bag.  Place a towel between your skin and the bag.  Leave the ice on for 15 to 20 minutes, 3 to 4 times a day.  Follow up with your caregiver for continued problems and no reason can be found for the pain. If the pain becomes worse or does not go away, it may be  necessary to repeat tests or do additional testing. Your caregiver may need to look further for a possible cause. SEEK IMMEDIATE MEDICAL CARE IF:  You have pain that is getting worse and is not relieved by medications.  You develop chest pain that is associated with shortness or breath, sweating, feeling sick to your stomach (nauseous), or throw up (vomit).  Your pain becomes localized to the abdomen.  You develop any new symptoms that seem different or that concern you. MAKE SURE YOU:   Understand these instructions.  Will watch your condition.  Will get help right away if you are not doing well or get worse.   This information is not intended to replace advice given to you by your health care provider. Make sure you discuss any questions you have with your health care provider.   Document Released: 01/26/2005 Document Revised: 04/20/2011 Document Reviewed: 09/30/2012 Elsevier Interactive Patient Education [employer].
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
## 2015-03-04 NOTE — ED Notes (Signed)
Pt informed Registration that she needed to leave and was unable to stay any longer.

## 2015-03-04 NOTE — ED Notes (Signed)
Verbalized understanding discharge instructions. In no acute distress.   
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
## 2015-03-28 ENCOUNTER — Other Ambulatory Visit: Payer: Self-pay | Admitting: Orthopaedic Surgery

## 2015-03-29 ENCOUNTER — Other Ambulatory Visit: Payer: Self-pay | Admitting: Orthopaedic Surgery

## 2015-03-29 DIAGNOSIS — M79672 Pain in left foot: Secondary | ICD-10-CM

## 2015-04-12 ENCOUNTER — Ambulatory Visit
Admission: RE | Admit: 2015-04-12 | Discharge: 2015-04-12 | Disposition: A | Discharge status: Home / Self Care | Payer: Medicaid Other | Source: Ambulatory Visit | Attending: Orthopaedic Surgery | Admitting: Orthopaedic Surgery

## 2015-04-12 DIAGNOSIS — M79672 Pain in left foot: Secondary | ICD-10-CM

## 2015-07-01 ENCOUNTER — Encounter: Payer: Self-pay | Admitting: Family Medicine

## 2015-07-01 ENCOUNTER — Other Ambulatory Visit (HOSPITAL_COMMUNITY)
Admission: RE | Admit: 2015-07-01 | Discharge: 2015-07-01 | Disposition: A | Discharge status: Home / Self Care | Payer: Medicaid Other | Source: Ambulatory Visit | Attending: Family Medicine | Admitting: Family Medicine

## 2015-07-01 ENCOUNTER — Ambulatory Visit (INDEPENDENT_AMBULATORY_CARE_PROVIDER_SITE_OTHER): Payer: Medicaid Other | Admitting: Family Medicine

## 2015-07-01 VITALS — BP 147/85 | HR 77 | Temp 98.1°F | Ht 62.0 in | Wt 221.9 lb

## 2015-07-01 DIAGNOSIS — Z8619 Personal history of other infectious and parasitic diseases: Secondary | ICD-10-CM | POA: Diagnosis not present

## 2015-07-01 DIAGNOSIS — N898 Other specified noninflammatory disorders of vagina: Secondary | ICD-10-CM

## 2015-07-01 DIAGNOSIS — N760 Acute vaginitis: Secondary | ICD-10-CM | POA: Diagnosis not present

## 2015-07-01 DIAGNOSIS — Z113 Encounter for screening for infections with a predominantly sexual mode of transmission: Secondary | ICD-10-CM | POA: Insufficient documentation

## 2015-07-01 DIAGNOSIS — A499 Bacterial infection, unspecified: Secondary | ICD-10-CM | POA: Diagnosis not present

## 2015-07-01 DIAGNOSIS — B9689 Other specified bacterial agents as the cause of diseases classified elsewhere: Secondary | ICD-10-CM

## 2015-07-01 LAB — POCT WET PREP (WET MOUNT): Clue Cells Wet Prep Whiff POC: POSITIVE

## 2015-07-01 MED ORDER — METRONIDAZOLE 500 MG PO TABS: 500.0000 mg | ORAL_TABLET | Freq: Two times a day (BID) | ORAL | Status: DC

## 2015-07-01 NOTE — Progress Notes (Signed)
Subjective:    Patient ID: Carolyn Alvarez, female    DOB: 26-Jul-1990, 25 y.o.   MRN: [id number]  Carolyn Alvarez is a 25 y.o. female presenting on 07/01/2015 for std check   Patient presents for a same day appointment.   HPI  STD TESTING: - Last seen for STD testing in 07/2014, had tests positive for Gonorrhea and Trichomonas 07/30/14, treated at [facility] with antibiotics for both, she did have sexual intercourse with same partner later, but no new symptoms. - Sexual history new partner female within past >3 months, occasional condom use, no contraception (had implanon in past but seemed to have an allergic reaction to it, can't recall to take pills, has thought about this in past) Patient's last menstrual period was 05/31/2015.  - No known recent exposure to STD - Denies rash, ulcer, vesicles, fever/chills, sweats  VAGINAL DISCHARGE: - Reports concern with vaginal discharge with odor for about 2 months. She changed soaps and used sensitive Dove, added shower. Not using any internal cleansing, but did start a detox. History of recurrent BV. - Denies pelvic or abdominal pain, nausea, vomiting  Social History  Substance Use Topics  . Smoking status: Never Smoker   . Smokeless tobacco: Never Used  . Alcohol Use: Yes    Review of Systems Per HPI unless specifically indicated above     Objective:    BP 147/85 mmHg  Pulse 77  Temp(Src) 98.1 F (36.7 C) (Oral)  Ht 5\' 2"  (1.575 m)  Wt 221 lb 14.4 oz (100.653 kg)  BMI 40.58 kg/m2  LMP 05/31/2015  Wt Readings from Last 3 Encounters:  07/01/15 221 lb 14.4 oz (100.653 kg)  01/23/15 218 lb (98.884 kg)  01/12/15 225 lb (102.059 kg)    Physical Exam  Constitutional: She appears well-developed and well-nourished. No distress.  Obese, well-appearing, comfortable, cooperative  HENT:  Mouth/Throat: Oropharynx is clear and moist.  Cardiovascular: Normal rate.   Genitourinary:  Normal external female genitalia. Vaginal canal  without lesions. Normal appearing cervix, without lesions or bleeding. Increased thin appearing grey discharge on exam. Bimanual exam without masses or cervical motion tenderness.  Pelvic exam chaperoned by Delray Alt CMA  Neurological: She is alert.  Skin: Skin is warm and dry. No rash noted. She is not diaphoretic.  Nursing note and vitals reviewed.  Results for orders placed or performed in visit on 07/01/15  HIV antibody  Result Value Ref Range   HIV 1&2 Ab, 4th Generation NONREACTIVE NONREACTIVE  RPR  Result Value Ref Range   RPR Ser Ql NON REAC NON REAC  POCT Wet Prep ([place])  Result Value Ref Range   Source Wet Prep POC VAG    WBC, Wet Prep HPF POC TOO MANY TO COUNT    Bacteria Wet Prep HPF POC Moderate (A) None, Few, Too numerous to count   Clue Cells Wet Prep HPF POC Moderate (A) None, Too numerous to count   Clue Cells Wet Prep Whiff POC Positive Whiff    Yeast Wet Prep HPF POC None    Trichomonas Wet Prep HPF POC NONE       Assessment & Plan:   Problem List Items Addressed This Visit    Screening for STD (sexually transmitted disease)    Repeat screening with GC/Chlamydia, HIV/RPR with new partner, high risk sexual history without condom use. No known exposure. - Results with HIV/RPR non-reactive - Trichomonas negative - Follow GC/Chlamydia      Relevant Orders  POCT Wet Prep [facility]) (Completed)   Cervicovaginal ancillary only   HIV antibody (Completed)   RPR (Completed)   History of gonorrhea    Check GC/Chlamydia swab today, previous history gonorrhea in 07/2014 s/p treatment      Relevant Orders   Cervicovaginal ancillary only    Other Visit Diagnoses    BV (bacterial vaginosis)    -  Primary    Consistent with BV on exam/wet prep. Start Flagyl x 7 day, trial condom use to reduce frequency, additionally given rx Diflucan if develops yeast.    Relevant Medications    metroNIDAZOLE (FLAGYL) 500 MG tablet    fluconazole (DIFLUCAN) 150 MG  tablet    Vaginal discharge        Relevant Medications    fluconazole (DIFLUCAN) 150 MG tablet    Other Relevant Orders    POCT Wet Prep [facility]) (Completed)    Cervicovaginal ancillary only       Meds ordered this encounter  Medications  . metroNIDAZOLE (FLAGYL) 500 MG tablet    Sig: Take 1 tablet (500 mg total) by mouth 2 (two) times daily. Do not drink alcohol while taking this medicine.    Dispense:  14 tablet    Refill:  0  . fluconazole (DIFLUCAN) 150 MG tablet    Sig: Take one tablet by mouth if develop yeast infection after antibiotics    Dispense:  1 tablet    Refill:  0    Follow up plan: Return in about 3 months (around 10/01/2015) for annual physical.  Nobie Putnam, DO [place], PGY-3

## 2015-07-01 NOTE — Patient Instructions (Signed)
Thank you for coming in to clinic today.  1. For Bacterial Vaginosis (BV) take Metronidazole 500mg  twice daily for 7 days, do not drink alcohol on this med as it will cause nausea/vomiting. - This problem can be recurrent. Some people are more likely to get it than others, and it has to do with normal body chemistry and vaginal environment. One of the biggest risk factors for causing BV is unprotected sexual intercourse (without a condom), because semen can change the chemistry of your vaginal environment, causing the "good bacteria" reduce and the other bacteria to grow and cause your symptoms. Recommend to start using condoms with intercourse to see if this helps reduce your symptoms, do this especially for next 1 week while on treatment. Some other recommendations include taking a daily probiotic and yogurt can help reduce BV as well, this is not proven to work in all patients.  We will notify you in 1-3 days regarding other STD test results.  Please schedule a follow-up appointment with Dr Berkley Harvey anytime within 1-3 months as needed, please schedule your annual physical when ready.  If you have any other questions or concerns, please feel free to call the clinic to contact me. You may also schedule an earlier appointment if necessary.  However, if your symptoms get significantly worse, please go to the Emergency Department to seek immediate medical attention.  Nobie Putnam, [place]

## 2015-07-02 LAB — RPR

## 2015-07-02 LAB — CERVICOVAGINAL ANCILLARY ONLY
CHLAMYDIA, DNA PROBE: NEGATIVE
Neisseria Gonorrhea: NEGATIVE

## 2015-07-02 LAB — HIV ANTIBODY (ROUTINE TESTING W REFLEX): HIV 1&2 Ab, 4th Generation: NONREACTIVE

## 2015-07-02 MED ORDER — FLUCONAZOLE 150 MG PO TABS: ORAL_TABLET | ORAL | Status: DC

## 2015-07-02 NOTE — Assessment & Plan Note (Signed)
Check GC/Chlamydia swab today, previous history gonorrhea in 07/2014 s/p treatment

## 2015-07-02 NOTE — Assessment & Plan Note (Addendum)
Repeat screening with GC/Chlamydia, HIV/RPR with new partner, high risk sexual history without condom use. No known exposure. - Results with HIV/RPR non-reactive - Trichomonas negative - Follow GC/Chlamydia

## 2015-07-03 ENCOUNTER — Ambulatory Visit: Payer: Medicaid Other | Admitting: Family Medicine

## 2016-01-08 ENCOUNTER — Ambulatory Visit: Payer: Medicaid Other | Admitting: Family Medicine

## 2016-01-08 ENCOUNTER — Ambulatory Visit: Payer: Medicaid Other | Admitting: Internal Medicine
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
## 2016-07-20 ENCOUNTER — Ambulatory Visit (INDEPENDENT_AMBULATORY_CARE_PROVIDER_SITE_OTHER): Payer: Medicaid Other | Admitting: Family Medicine

## 2016-07-20 ENCOUNTER — Encounter: Payer: Self-pay | Admitting: Family Medicine

## 2016-07-20 ENCOUNTER — Other Ambulatory Visit (HOSPITAL_COMMUNITY)
Admission: RE | Admit: 2016-07-20 | Discharge: 2016-07-20 | Disposition: A | Discharge status: Home / Self Care | Payer: Medicaid Other | Source: Ambulatory Visit | Attending: Family Medicine | Admitting: Family Medicine

## 2016-07-20 VITALS — BP 124/70 | HR 71 | Temp 98.3°F | Ht 62.0 in | Wt 206.8 lb

## 2016-07-20 DIAGNOSIS — Z202 Contact with and (suspected) exposure to infections with a predominantly sexual mode of transmission: Secondary | ICD-10-CM | POA: Insufficient documentation

## 2016-07-20 DIAGNOSIS — Z7251 High risk heterosexual behavior: Secondary | ICD-10-CM | POA: Diagnosis not present

## 2016-07-20 DIAGNOSIS — Z113 Encounter for screening for infections with a predominantly sexual mode of transmission: Secondary | ICD-10-CM

## 2016-07-20 LAB — POCT WET PREP (WET MOUNT)
CLUE CELLS WET PREP WHIFF POC: NEGATIVE
TRICHOMONAS WET PREP HPF POC: ABSENT

## 2016-07-20 NOTE — Patient Instructions (Signed)
You were seen today for an STD check.  For now we will wait until the results come back.  I will contact you and let you know if you need any treatment.  Please practice safe sex including always using condoms to prevent STDs and HIV.  We are always here to talk about birth control if you would like to start something.   Very nice meeting you today.   Trease Bremner L. Rosalyn Gess, MD 07/20/2016 10:51 AM
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
## 2016-07-21 LAB — GC/CHLAMYDIA PROBE AMP (~~LOC~~) NOT AT ARMC
Chlamydia: NEGATIVE
Neisseria Gonorrhea: NEGATIVE

## 2016-07-21 LAB — HCG, SERUM, QUALITATIVE: HCG, BETA SUBUNIT, QUAL, SERUM: NEGATIVE m[IU]/mL (ref <6)

## 2016-07-21 LAB — RPR: RPR Ser Ql: NONREACTIVE

## 2016-07-21 LAB — HEPATITIS B SURFACE ANTIGEN: Hepatitis B Surface Ag: NEGATIVE

## 2016-07-21 LAB — HIV ANTIBODY (ROUTINE TESTING W REFLEX): HIV SCREEN 4TH GENERATION: NONREACTIVE

## 2016-09-23 ENCOUNTER — Telehealth: Payer: Self-pay | Admitting: *Deleted

## 2016-09-23 NOTE — Telephone Encounter (Signed)
Pt calls nurse line.  She states that provider told her to call in if she started to have discharge and he would call her in some medication.  Pt states that she is having itching, a yellow discharge and no odor.   Pt uses [facility] on [address]. Clinton Sawyer, [place] Spotted, [place]

## 2016-09-23 NOTE — Telephone Encounter (Signed)
Spoke to pt. She will call and schedule an appt. She was not able to make an appt at the time I called her. Ottis Stain, CMA

## 2016-09-23 NOTE — Telephone Encounter (Signed)
Please let patient know she needs to come in to be seen.  I saw her over two months ago.    Thanks, Dr. Rosalyn Gess

## 2016-10-21 ENCOUNTER — Encounter: Payer: Self-pay | Admitting: Family Medicine

## 2016-10-21 ENCOUNTER — Ambulatory Visit (INDEPENDENT_AMBULATORY_CARE_PROVIDER_SITE_OTHER): Payer: Medicaid Other | Admitting: Family Medicine

## 2016-10-21 VITALS — BP 110/70 | Temp 98.0°F | Wt 203.0 lb

## 2016-10-21 DIAGNOSIS — B9689 Other specified bacterial agents as the cause of diseases classified elsewhere: Secondary | ICD-10-CM | POA: Diagnosis not present

## 2016-10-21 DIAGNOSIS — N760 Acute vaginitis: Secondary | ICD-10-CM

## 2016-10-21 DIAGNOSIS — N921 Excessive and frequent menstruation with irregular cycle: Secondary | ICD-10-CM | POA: Diagnosis not present

## 2016-10-21 DIAGNOSIS — Z113 Encounter for screening for infections with a predominantly sexual mode of transmission: Secondary | ICD-10-CM

## 2016-10-21 MED ORDER — NORGESTIMATE-ETH ESTRADIOL 0.25-35 MG-MCG PO TABS: 1.0000 | ORAL_TABLET | Freq: Every day | ORAL | 11 refills | Status: DC

## 2016-10-21 MED ORDER — METRONIDAZOLE 500 MG PO TABS
500.0000 mg | ORAL_TABLET | Freq: Two times a day (BID) | ORAL | 0 refills | Status: DC
Start: 2016-10-21 — End: 2019-12-29

## 2016-10-21 NOTE — Assessment & Plan Note (Signed)
Metro BID x7days

## 2016-10-21 NOTE — Assessment & Plan Note (Signed)
Pelvic ultrasound ordered  sprintec

## 2016-10-21 NOTE — Progress Notes (Signed)
    Subjective:  Carolyn Alvarez is a 26 y.o. female who presents to the [facility] today with a chief complaint of vaginal discharge and menorrhagia.     Vaginal discharge is malodorous and occasionally white in color for 2 months with no improvement.  She has no pain on urination or with sexual intercourse.   She has had no specific bleeding in the discharge aside from her menses.   No abdominal pain.  This is consistent with multiple other diagnosis for BV in the past.  Mennorhagia has been consistent since abortion 13 months ago.   Heavy bleeding for roughly 5-7days requiring change of pad every 2 hours and waking up once each night as well to change the pad.   No bleeding when not on cycle.   Cycle come irregularly.  She has had allergic reaction to implananon in past, did not like the nuvaring, and has had inconsistency with BC pills.   She would like to try [facility] pills again.    Objective:  Physical Exam: BP 110/70   Temp 98 F (36.7 C) (Oral)   Wt 203 lb (92.1 kg)   LMP 10/21/2016   BMI 37.13 kg/m   Gen: NAD, resting comfortably, heavy set CV: RRR with no murmurs appreciated Pulm: NWOB, CTAB with no crackles, wheezes, or rhonchi GI: Normal bowel sounds present. Soft, Nontender, Nondistended. MSK: no edema, cyanosis, or clubbing noted Skin: warm, dry Neuro: grossly normal, moves all extremities Psych: Normal affect and thought content *no vaginal exam done due to patient's being uncomfortable as she was on menses and repeated consistent BV and ultrasound being ordered   No results found for this or any previous visit (from the past 72 hour(s)).   Assessment/Plan:  Menorrhagia with irregular cycle Pelvic ultrasound ordered  sprintec  BV (bacterial vaginosis) Metro BID x7days  Screening for STD (sexually transmitted disease) Patient refuses further STD screening as only sexual contact with same partner   Sherene Sires, [place] - PGY1 10/21/2016 5:03  PM

## 2016-10-21 NOTE — Patient Instructions (Signed)
It was a pleasure to see you today! Thank you for choosing [facility] for your primary care. Carolyn Alvarez was seen for heavy menses and vaginal discharge. Come back to the clinic if your symptoms do not get better in the next two weeks, and go to the emergency room if you have any life threatening symptoms.  We are ordering 1wk worth of metronidazole for you, please do not drink on this medicine.   We are also prescribing a birth control pill called sprintek, your phone app can help you remember to take this regularly.   We have ordered an [place] of your pelvis to help evaluate this increased bleeding since your procedure last year.   We have given you a number of condoms to help with STI and pregnancy prevention.    If we did any lab work today, and the results require attention, either me or my nurse will get in touch with you. If everything is normal, you will get a letter in mail and a message via . If you don't hear from [place] in two weeks, please give [place] a call. Otherwise, we look forward to seeing you again at your next visit. If you have any questions or concerns before then, please call the clinic at [phone].  Please bring all your medications to every doctors visit  Sign up for My Chart to have easy access to your labs results, and communication with your Primary care physician.    Please check-out at the front desk before leaving the clinic.    Best,  Dr. Sherene Sires FAMILY MEDICINE RESIDENT - PGY1 10/21/2016 4:47 PM

## 2016-10-21 NOTE — Assessment & Plan Note (Signed)
Patient refuses further STD screening as only sexual contact with same partner

## 2016-10-26 ENCOUNTER — Ambulatory Visit (HOSPITAL_COMMUNITY): Admission: RE | Admit: 2016-10-26 | Payer: Medicaid Other | Source: Ambulatory Visit

## 2016-10-26 ENCOUNTER — Ambulatory Visit (HOSPITAL_COMMUNITY): Payer: Medicaid Other

## 2016-11-01 ENCOUNTER — Emergency Department (HOSPITAL_COMMUNITY)
Admission: EM | Admit: 2016-11-01 | Discharge: 2016-11-01 | Discharge status: Against Medical Advice | Payer: Medicaid Other

## 2016-11-01 NOTE — ED Notes (Signed)
Pt called for triage 2x no response

## 2016-11-01 NOTE — ED Triage Notes (Signed)
Called for triage 1x, no response

## 2017-02-15 ENCOUNTER — Ambulatory Visit: Payer: Medicaid Other | Admitting: Family Medicine

## 2017-02-16 ENCOUNTER — Ambulatory Visit: Payer: Medicaid Other | Admitting: Family Medicine

## 2017-02-17 ENCOUNTER — Other Ambulatory Visit (HOSPITAL_COMMUNITY)
Admission: RE | Admit: 2017-02-17 | Discharge: 2017-02-17 | Disposition: A | Discharge status: Home / Self Care | Payer: Medicaid Other | Source: Ambulatory Visit | Attending: Family Medicine | Admitting: Family Medicine

## 2017-02-17 ENCOUNTER — Encounter: Payer: Self-pay | Admitting: Internal Medicine

## 2017-02-17 ENCOUNTER — Ambulatory Visit: Payer: Medicaid Other | Admitting: Internal Medicine

## 2017-02-17 VITALS — BP 120/70 | HR 73 | Temp 98.9°F | Wt 204.8 lb

## 2017-02-17 DIAGNOSIS — Z113 Encounter for screening for infections with a predominantly sexual mode of transmission: Secondary | ICD-10-CM | POA: Diagnosis present

## 2017-02-17 LAB — POCT WET PREP (WET MOUNT)
CLUE CELLS WET PREP WHIFF POC: NEGATIVE
Trichomonas Wet Prep HPF POC: ABSENT
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
## 2017-02-17 NOTE — Patient Instructions (Signed)
We will let you know about the results of your STD screening

## 2017-02-18 ENCOUNTER — Encounter: Payer: Self-pay | Admitting: Internal Medicine

## 2017-02-18 LAB — HIV ANTIBODY (ROUTINE TESTING W REFLEX): HIV Screen 4th Generation wRfx: NONREACTIVE

## 2017-02-18 LAB — RPR: RPR Ser Ql: NONREACTIVE

## 2017-02-18 LAB — CERVICOVAGINAL ANCILLARY ONLY
Chlamydia: NEGATIVE
Neisseria Gonorrhea: NEGATIVE

## 2017-02-18 NOTE — Assessment & Plan Note (Signed)
No specific symptoms.  Would just like to be tested.  Examination within normal limits - HIV antibody - RPR - POCT Wet Prep [facility]) - Cervicovaginal ancillary only

## 2017-02-19 ENCOUNTER — Telehealth: Payer: Self-pay | Admitting: Internal Medicine

## 2017-02-19 NOTE — Telephone Encounter (Signed)
Please call patient and let her know all results are negative. Thanks Shaneta Cervenka

## 2017-02-19 NOTE — Telephone Encounter (Signed)
Pt informed. Deseree Blount, CMA  

## 2017-07-09 ENCOUNTER — Ambulatory Visit: Payer: Medicaid Other | Admitting: Family Medicine

## 2018-09-23 ENCOUNTER — Other Ambulatory Visit: Payer: Self-pay

## 2018-09-23 ENCOUNTER — Emergency Department: Payer: Medicaid Other

## 2018-09-23 ENCOUNTER — Encounter: Payer: Self-pay | Admitting: Emergency Medicine

## 2018-09-23 ENCOUNTER — Emergency Department
Admission: EM | Admit: 2018-09-23 | Discharge: 2018-09-23 | Disposition: A | Discharge status: Home / Self Care | Payer: Medicaid Other | Attending: Emergency Medicine | Admitting: Emergency Medicine

## 2018-09-23 DIAGNOSIS — R519 Headache, unspecified: Secondary | ICD-10-CM

## 2018-09-23 DIAGNOSIS — R51 Headache: Secondary | ICD-10-CM | POA: Diagnosis present

## 2018-09-23 LAB — CBC WITH DIFFERENTIAL/PLATELET
Abs Immature Granulocytes: 0.02 10*3/uL (ref 0.00–0.07)
Basophils Absolute: 0 10*3/uL (ref 0.0–0.1)
Basophils Relative: 0 %
Eosinophils Absolute: 0.1 10*3/uL (ref 0.0–0.5)
Eosinophils Relative: 2 %
HCT: 35.6 % — ABNORMAL LOW (ref 36.0–46.0)
Hemoglobin: 11 g/dL — ABNORMAL LOW (ref 12.0–15.0)
Immature Granulocytes: 0 %
Lymphocytes Relative: 53 %
Lymphs Abs: 3.5 10*3/uL (ref 0.7–4.0)
MCH: 28.1 pg (ref 26.0–34.0)
MCHC: 30.9 g/dL (ref 30.0–36.0)
MCV: 91 fL (ref 80.0–100.0)
Monocytes Absolute: 0.6 10*3/uL (ref 0.1–1.0)
Monocytes Relative: 8 %
Neutro Abs: 2.5 10*3/uL (ref 1.7–7.7)
Neutrophils Relative %: 37 %
Platelets: 416 10*3/uL — ABNORMAL HIGH (ref 150–400)
RBC: 3.91 MIL/uL (ref 3.87–5.11)
RDW: 13.2 % (ref 11.5–15.5)
WBC: 6.6 10*3/uL (ref 4.0–10.5)
nRBC: 0 % (ref 0.0–0.2)

## 2018-09-23 LAB — COMPREHENSIVE METABOLIC PANEL
ALT: 11 U/L (ref 0–44)
AST: 20 U/L (ref 15–41)
Albumin: 4 g/dL (ref 3.5–5.0)
Alkaline Phosphatase: 82 U/L (ref 38–126)
Anion gap: 9 (ref 5–15)
BUN: 12 mg/dL (ref 6–20)
CO2: 27 mmol/L (ref 22–32)
Calcium: 9.1 mg/dL (ref 8.9–10.3)
Chloride: 101 mmol/L (ref 98–111)
Creatinine, Ser: 0.65 mg/dL (ref 0.44–1.00)
GFR calc Af Amer: >60 mL/min (ref >60)
GFR calc non Af Amer: >60 mL/min (ref >60)
Glucose, Bld: 106 mg/dL — ABNORMAL HIGH (ref 70–99)
Potassium: 3.5 mmol/L (ref 3.5–5.1)
Sodium: 137 mmol/L (ref 135–145)
Total Bilirubin: 0.3 mg/dL (ref 0.3–1.2)
Total Protein: 8.9 g/dL — ABNORMAL HIGH (ref 6.5–8.1)

## 2018-09-23 LAB — POCT PREGNANCY, URINE: Preg Test, Ur: NEGATIVE

## 2018-09-23 MED ORDER — KETOROLAC TROMETHAMINE 30 MG/ML IJ SOLN: 30.0000 mg | Freq: Once | INTRAMUSCULAR | Status: DC

## 2018-09-23 MED ORDER — SODIUM CHLORIDE 0.9 % IV SOLN
Freq: Once | INTRAVENOUS | Status: AC
  Administered 2018-09-23: 08:00:00 via INTRAVENOUS

## 2018-09-23 MED ORDER — METOCLOPRAMIDE HCL 10 MG PO TABS: 10.0000 mg | ORAL_TABLET | Freq: Three times a day (TID) | ORAL | 1 refills | Status: DC | PRN

## 2018-09-23 MED ORDER — DIPHENHYDRAMINE HCL 50 MG/ML IJ SOLN
25.0000 mg | Freq: Once | INTRAMUSCULAR | Status: AC
  Administered 2018-09-23: 25 mg via INTRAVENOUS
  Filled 2018-09-23: qty 1

## 2018-09-23 MED ORDER — METOCLOPRAMIDE HCL 5 MG/ML IJ SOLN
10.0000 mg | Freq: Once | INTRAMUSCULAR | Status: AC
  Administered 2018-09-23: 10 mg via INTRAVENOUS
  Filled 2018-09-23: qty 2

## 2018-09-23 NOTE — ED Notes (Signed)
Patient transported to CT 

## 2018-09-23 NOTE — ED Triage Notes (Signed)
Patient presents to the ED with severe headache x 20 min.  Patient reports photophobia.  Patient states she has had similar headaches in the past but this is the most severe and has lasted the longest.  Patient is ambulatory with steady gait.  Alert and oriented with clear speech.

## 2018-09-23 NOTE — ED Notes (Signed)
Pt returned from CT °

## 2018-09-23 NOTE — ED Provider Notes (Signed)
[facility] Emergency Department Provider Note       Time seen: ----------------------------------------- 7:12 AM on 09/23/2018 -----------------------------------------  I have reviewed the triage vital signs and the nursing notes.  HISTORY   Chief Complaint Headache   HPI Carolyn Alvarez is a 28 y.o. female with a history of eczema, anemia, menorrhagia, depression who presents to the ED for severe headache for the last 20 minutes.  Patient does report some photophobia.  She reports some similar headaches in the past but they do not usually last this long.  She arrives in neurologically intact  Past Medical History:  Diagnosis Date  . Eczema     Patient Active Problem List   Diagnosis Date Noted  . BV (bacterial vaginosis) 10/21/2016  . Screening for STD (sexually transmitted disease) 07/01/2015  . History of gonorrhea 08/01/2014  . Anemia 07/30/2014  . Seasonal allergies 06/21/2013  . Oligomenorrhea 03/13/2011  . Menorrhagia with irregular cycle 04/28/2010  . DEPRESSIVE DISORDER 11/27/2008  . ECZEMA, ATOPIC 11/27/2008    History reviewed. No pertinent surgical history.  Allergies Citrus  Social History Social History   Tobacco Use  . Smoking status: Never Smoker  . Smokeless tobacco: Never Used  Substance Use Topics  . Alcohol use: Yes  . Drug use: No    Review of Systems Constitutional: Negative for fever. Cardiovascular: Negative for chest pain. Respiratory: Negative for shortness of breath. Gastrointestinal: Negative for abdominal pain, vomiting and diarrhea. Musculoskeletal: Negative for back pain. Skin: Negative for rash. Neurological: Positive for headache  All systems negative/normal/unremarkable except as stated in the HPI  ____________________________________________   PHYSICAL EXAM:  VITAL SIGNS: ED Triage Vitals  Enc Vitals Group     BP 09/23/18 0703 (!) 148/96     Pulse Rate 09/23/18 0703 71     Resp  09/23/18 0703 16     Temp 09/23/18 0703 97.7 F (36.5 C)     Temp Source 09/23/18 0703 Oral     SpO2 09/23/18 0703 100 %     Weight 09/23/18 0705 210 lb (95.3 kg)     Height 09/23/18 0705 5\' 1"  (1.549 m)     Head Circumference --      Peak Flow --      Pain Score --      Pain Loc --      Pain Edu? --      Excl. in [place]? --     Constitutional: Alert and oriented. Well appearing and in no distress. Eyes: Conjunctivae are normal. Normal extraocular movements. ENT      Head: Normocephalic and atraumatic.      Nose: No congestion/rhinnorhea.      Mouth/Throat: Mucous membranes are moist.      Neck: No stridor. Cardiovascular: Normal rate, regular rhythm. No murmurs, rubs, or gallops. Respiratory: Normal respiratory effort without tachypnea nor retractions. Breath sounds are clear and equal bilaterally. No wheezes/rales/rhonchi. Gastrointestinal: Soft and nontender. Normal bowel sounds Musculoskeletal: Nontender with normal range of motion in extremities. No lower extremity tenderness nor edema. Neurologic:  Normal speech and language. No gross focal neurologic deficits are appreciated.  Skin:  Skin is warm, dry and intact. No rash noted. Psychiatric: Mood and affect are normal. Speech and behavior are normal.  ____________________________________________  ED COURSE:  As part of my medical decision making, I reviewed the following data within the [place] History obtained from family if available, nursing notes, old chart and ekg, as well as notes from  prior ED visits. Patient presented for severe headache, we will assess with labs and imaging as indicated at this time.   Procedures  Carolyn Alvarez was evaluated in Emergency Department on 09/23/2018 for the symptoms described in the history of present illness. She was evaluated in the context of the global COVID-19 pandemic, which necessitated consideration that the patient might be at risk for infection with  the SARS-CoV-2 virus that causes COVID-19. Institutional protocols and algorithms that pertain to the evaluation of patients at risk for COVID-19 are in a state of rapid change based on information released by regulatory bodies including the CDC and federal and state organizations. These policies and algorithms were followed during the patient's care in the ED.  ____________________________________________   LABS (pertinent positives/negatives)  Labs Reviewed  CBC WITH DIFFERENTIAL/PLATELET - Abnormal; Notable for the following components:      Result Value   Hemoglobin 11.0 (*)    HCT 35.6 (*)    Platelets 416 (*)    All other components within normal limits  COMPREHENSIVE METABOLIC PANEL - Abnormal; Notable for the following components:   Glucose, Bld 106 (*)    Total Protein 8.9 (*)    All other components within normal limits  POC URINE PREG, ED  POCT PREGNANCY, URINE    RADIOLOGY Images were viewed by me  CT head IMPRESSION: Study within normal limits.  ____________________________________________   DIFFERENTIAL DIAGNOSIS   Dehydration, anemia, electrolyte abnormality, migraine, tension headache, anxiety, sleep deprivation, pregnancy  FINAL ASSESSMENT AND PLAN  Headache  Plan: The patient had presented for severe headache. Patient's labs did not reveal any acute process. Patient's imaging was within normal limits.  Her headache has resolved with fluids and Reglan.  This is felt to likely be multifactorial.  She is cleared for outpatient follow-up as needed.   Laurence Aly, MD    Note: This note was generated in part or whole with voice recognition software. Voice recognition is usually quite accurate but there are transcription errors that can and very often do occur. I apologize for any typographical errors that were not detected and corrected.     Earleen Newport, MD 09/23/18 1023

## 2018-09-23 NOTE — ED Notes (Signed)
Pt states having a headache with a pain level of 10 out of 10 with photophobia, but is able to hold a steady conversation and joke around while in the room. NAD. VSS. Will continue to monitor.

## 2018-09-23 NOTE — ED Notes (Signed)
Pt states having bad headache that lasted 4-5 minutes after orgasm 5-6 years ago but that they went away. Now pt states having recurrent headaches that started a few days ago when she worked out, but that it went away when she was done. Today the headache came on while she was at work. Pt states head hurts around the back of her head and into her neck. No n/v or fevers noted.

## 2018-11-12 ENCOUNTER — Emergency Department
Admission: EM | Admit: 2018-11-12 | Discharge: 2018-11-12 | Disposition: A | Discharge status: Home / Self Care | Payer: Medicaid Other | Attending: Student | Admitting: Student

## 2018-11-12 ENCOUNTER — Other Ambulatory Visit: Payer: Self-pay

## 2018-11-12 DIAGNOSIS — S0501XA Injury of conjunctiva and corneal abrasion without foreign body, right eye, initial encounter: Secondary | ICD-10-CM | POA: Insufficient documentation

## 2018-11-12 DIAGNOSIS — S0591XA Unspecified injury of right eye and orbit, initial encounter: Secondary | ICD-10-CM | POA: Diagnosis present

## 2018-11-12 DIAGNOSIS — Y998 Other external cause status: Secondary | ICD-10-CM | POA: Diagnosis not present

## 2018-11-12 DIAGNOSIS — Z79899 Other long term (current) drug therapy: Secondary | ICD-10-CM | POA: Diagnosis not present

## 2018-11-12 DIAGNOSIS — Y929 Unspecified place or not applicable: Secondary | ICD-10-CM | POA: Diagnosis not present

## 2018-11-12 DIAGNOSIS — X58XXXA Exposure to other specified factors, initial encounter: Secondary | ICD-10-CM | POA: Diagnosis not present

## 2018-11-12 DIAGNOSIS — Y93E8 Activity, other personal hygiene: Secondary | ICD-10-CM | POA: Diagnosis not present

## 2018-11-12 DIAGNOSIS — H53149 Visual discomfort, unspecified: Secondary | ICD-10-CM | POA: Insufficient documentation

## 2018-11-12 MED ORDER — FLUORESCEIN SODIUM 1 MG OP STRP: 1.0000 | ORAL_STRIP | Freq: Once | OPHTHALMIC | Status: DC

## 2018-11-12 MED ORDER — TETRACAINE HCL 0.5 % OP SOLN: 1.0000 [drp] | Freq: Once | OPHTHALMIC | Status: DC

## 2018-11-12 MED ORDER — FLUORESCEIN SODIUM 1 MG OP STRP
1.0000 | ORAL_STRIP | Freq: Once | OPHTHALMIC | Status: AC
  Administered 2018-11-12: 08:00:00 1 via OPHTHALMIC
  Filled 2018-11-12: qty 1

## 2018-11-12 MED ORDER — TETRACAINE HCL 0.5 % OP SOLN
2.0000 [drp] | Freq: Once | OPHTHALMIC | Status: AC
  Administered 2018-11-12: 08:00:00 2 [drp] via OPHTHALMIC
  Filled 2018-11-12: qty 4

## 2018-11-12 MED ORDER — SULFACETAMIDE SODIUM 10 % OP SOLN: 2.0000 [drp] | OPHTHALMIC | 0 refills | Status: AC

## 2018-11-12 MED ORDER — EYE WASH OPHTH SOLN
1.0000 [drp] | OPHTHALMIC | Status: DC | PRN
  Administered 2018-11-12: 1 [drp] via OPHTHALMIC
  Filled 2018-11-12: qty 118

## 2018-11-12 NOTE — ED Provider Notes (Signed)
[facility] Emergency Department Provider Note   ____________________________________________   First MD Initiated Contact with Patient 11/12/18 [phone]     (approximate)  I have reviewed the triage vital signs and the nursing notes.   HISTORY  Chief Complaint Eye Pain    HPI Carolyn Alvarez is a 27 y.o. female patient complain of right arm pain secondary to use of nonprescription colored contact lenses.  Patient states she start experiencing pain after placement of contact lens.  Patient denies vision disturbance.  Patient states she is aware that she needs glasses.  Patient stated mild photophobia.  Patient rates pain as a 10/10.  Patient described the pain as foreign body sensation.  Patient has removed the contact lens.  No palliative measure for complaint.         Past Medical History:  Diagnosis Date  . Eczema     Patient Active Problem List   Diagnosis Date Noted  . BV (bacterial vaginosis) 10/21/2016  . Screening for STD (sexually transmitted disease) 07/01/2015  . History of gonorrhea 08/01/2014  . Anemia 07/30/2014  . Seasonal allergies 06/21/2013  . Oligomenorrhea 03/13/2011  . Menorrhagia with irregular cycle 04/28/2010  . DEPRESSIVE DISORDER 11/27/2008  . ECZEMA, ATOPIC 11/27/2008    History reviewed. No pertinent surgical history.  Prior to Admission medications   Medication Sig Start Date End Date Taking? Authorizing Provider  fluconazole (DIFLUCAN) 150 MG tablet Take one tablet by mouth if develop yeast infection after antibiotics Patient not taking: Reported on 07/20/2016 07/02/15   Olin Hauser, DO  ibuprofen (ADVIL,MOTRIN) 800 MG tablet Take 1 tablet (800 mg total) by mouth every 8 (eight) hours as needed for moderate pain. Patient not taking: Reported on 07/20/2016 03/04/15   Robinwood Lions, PA-C  metoCLOPramide (REGLAN) 10 MG tablet Take 1 tablet (10 mg total) by mouth every 8 (eight) hours as needed  for nausea or vomiting (can also be taken for headache). 09/23/18   Earleen Newport, MD  metroNIDAZOLE (FLAGYL) 500 MG tablet Take 1 tablet (500 mg total) by mouth 2 (two) times daily. Do not drink alcohol while taking this medicine. 10/21/16   Sherene Sires, DO  norgestimate-ethinyl estradiol (SPRINTEC 28) 0.25-35 MG-MCG tablet Take 1 tablet by mouth daily. 10/21/16   Sherene Sires, DO  sulfacetamide (BLEPH-10) 10 % ophthalmic solution Place 2 drops into the right eye every 4 (four) hours for 10 days. 11/12/18 11/22/18  Sable Feil, PA-C    Allergies Citrus  No family history on file.  Social History Social History   Tobacco Use  . Smoking status: Never Smoker  . Smokeless tobacco: Never Used  Substance Use Topics  . Alcohol use: Yes  . Drug use: No    Review of Systems Constitutional: No fever/chills Eyes: Right eye pain. ENT: No sore throat. Cardiovascular: Denies chest pain. Respiratory: Denies shortness of breath. Gastrointestinal: No abdominal pain.  No nausea, no vomiting.  No diarrhea.  No constipation. Genitourinary: Negative for dysuria. Musculoskeletal: Negative for back pain. Skin: Negative for rash. Neurological: Negative for headaches, focal weakness or numbness. Allergic/Immunilogical: Citrus. ____________________________________________   PHYSICAL EXAM:  VITAL SIGNS: ED Triage Vitals  Enc Vitals Group     BP 11/12/18 0219 138/67     Pulse Rate 11/12/18 0219 75     Resp 11/12/18 0219 16     Temp 11/12/18 0219 99.1 F (37.3 C)     Temp Source 11/12/18 0219 Oral     SpO2 11/12/18  0219 100 %     Weight 11/12/18 0217 206 lb (93.4 kg)     Height 11/12/18 0217 5\' 1"  (1.549 m)     Head Circumference --      Peak Flow --      Pain Score 11/12/18 0216 10     Pain Loc --      Pain Edu? --      Excl. in [place]? --     Constitutional: Alert and oriented. Well appearing and in no acute distress. Eyes: Conjunctivae are normal. PERRL. EOMI. see visual acuity  and nurses notes.  Fluorescein stain reveal small corneal abrasion inferior right eyelid. Cardiovascular: Normal rate, regular rhythm. Grossly normal heart sounds.  Good peripheral circulation. Respiratory: Normal respiratory effort.  No retractions. Lungs CTAB.   ____________________________________________   LABS (all labs ordered are listed, but only abnormal results are displayed)  Labs Reviewed - No data to display ____________________________________________  EKG   ____________________________________________  RADIOLOGY  ED MD interpretation:    Official radiology report(s): No results found.  ____________________________________________   PROCEDURES  Procedure(s) performed (including Critical Care):  Procedures   ____________________________________________   INITIAL IMPRESSION / ASSESSMENT AND PLAN / ED COURSE  As part of my medical decision making, I reviewed the following data within the [place] was evaluated in Emergency Department on 11/12/2018 for the symptoms described in the history of present illness. She was evaluated in the context of the global COVID-19 pandemic, which necessitated consideration that the patient might be at risk for infection with the SARS-CoV-2 virus that causes COVID-19. Institutional protocols and algorithms that pertain to the evaluation of patients at risk for COVID-19 are in a state of rapid change based on information released by regulatory bodies including the CDC and federal and state organizations. These policies and algorithms were followed during the patient's care in the ED.  Right eye pain secondary to corneal abrasion.  Patient given discharge care instructions.  Patient advised follow-up ophthalmology if no improvement in 2 to 3 days.  Return to ED if condition worsens.      ____________________________________________   FINAL CLINICAL IMPRESSION(S) / ED  DIAGNOSES  Final diagnoses:  Abrasion of right cornea, initial encounter     ED Discharge Orders         Ordered    sulfacetamide (BLEPH-10) 10 % ophthalmic solution  Every 4 hours     11/12/18 0734           Note:  This document was prepared using Dragon voice recognition software and may include unintentional dictation errors.    Sable Feil, PA-C 11/12/18 0740    Lilia Pro., MD 11/12/18 1100

## 2018-11-12 NOTE — ED Triage Notes (Signed)
Patient reports she put in her contacts at 2015 last night and experienced some pain. Patient rubbed her contact to help. Patient reports the pain lessened, but still persisted. Patient's pain worsened throughout the night. Patient has not removed contact. Patient has difficulty keeping eye open.

## 2019-11-14 ENCOUNTER — Other Ambulatory Visit: Payer: Self-pay

## 2019-11-14 ENCOUNTER — Ambulatory Visit (HOSPITAL_COMMUNITY)
Admission: EM | Admit: 2019-11-14 | Discharge: 2019-11-14 | Disposition: A | Discharge status: Home / Self Care | Payer: Medicaid Other | Attending: Family Medicine | Admitting: Family Medicine

## 2019-11-14 ENCOUNTER — Encounter (HOSPITAL_COMMUNITY): Payer: Self-pay

## 2019-11-14 DIAGNOSIS — R059 Cough, unspecified: Secondary | ICD-10-CM | POA: Insufficient documentation

## 2019-11-14 DIAGNOSIS — J3089 Other allergic rhinitis: Secondary | ICD-10-CM | POA: Insufficient documentation

## 2019-11-14 DIAGNOSIS — Z20822 Contact with and (suspected) exposure to covid-19: Secondary | ICD-10-CM | POA: Diagnosis not present

## 2019-11-14 LAB — SARS CORONAVIRUS 2 (TAT 6-24 HRS): SARS Coronavirus 2: NEGATIVE

## 2019-11-14 MED ORDER — MONTELUKAST SODIUM 10 MG PO TABS: 10.0000 mg | ORAL_TABLET | Freq: Every day | ORAL | 0 refills | Status: DC

## 2019-11-14 MED ORDER — ALBUTEROL SULFATE HFA 108 (90 BASE) MCG/ACT IN AERS: 1.0000 | INHALATION_SPRAY | Freq: Four times a day (QID) | RESPIRATORY_TRACT | 0 refills | Status: DC | PRN

## 2019-11-14 NOTE — ED Provider Notes (Signed)
[facility]    CSN: [id number] Arrival date & time: 11/14/19  0930      History   Chief Complaint Chief Complaint  Patient presents with  . Cough  . Shortness of Breath    HPI Carolyn Alvarez is a 29 y.o. female.   Here today for 3 weeks of dry cough and wheezes. States the wheezing is worst in the AM. Significant hx of allergic rhinitis, thinks she probably has asthma but has never been formally diagnosed. Has used albuterol in the past with good relief. Takes antihistamines daily in addition to mucinex which provides temporary relief. Denies fever, chills, CP, SOB, sore throat, rashes, N/V/D.      Past Medical History:  Diagnosis Date  . Eczema     Patient Active Problem List   Diagnosis Date Noted  . BV (bacterial vaginosis) 10/21/2016  . Screening for STD (sexually transmitted disease) 07/01/2015  . History of gonorrhea 08/01/2014  . Anemia 07/30/2014  . Seasonal allergies 06/21/2013  . Oligomenorrhea 03/13/2011  . Menorrhagia with irregular cycle 04/28/2010  . DEPRESSIVE DISORDER 11/27/2008  . ECZEMA, ATOPIC 11/27/2008    Past Surgical History:  Procedure Laterality Date  . WISDOM TOOTH EXTRACTION      OB History   No obstetric history on file.      Home Medications    Prior to Admission medications   Medication Sig Start Date End Date Taking? Authorizing Provider  albuterol (VENTOLIN HFA) 108 (90 Base) MCG/ACT inhaler Inhale 1-2 puffs into the lungs every 6 (six) hours as needed for wheezing or shortness of breath. 11/14/19   Volney American, PA-C  fluconazole (DIFLUCAN) 150 MG tablet Take one tablet by mouth if develop yeast infection after antibiotics Patient not taking: Reported on 07/20/2016 07/02/15   Olin Hauser, DO  ibuprofen (ADVIL,MOTRIN) 800 MG tablet Take 1 tablet (800 mg total) by mouth every 8 (eight) hours as needed for moderate pain. Patient not taking: Reported on 07/20/2016 03/04/15   Florin Lions, PA-C  metoCLOPramide (REGLAN) 10 MG tablet Take 1 tablet (10 mg total) by mouth every 8 (eight) hours as needed for nausea or vomiting (can also be taken for headache). 09/23/18   Earleen Newport, MD  metroNIDAZOLE (FLAGYL) 500 MG tablet Take 1 tablet (500 mg total) by mouth 2 (two) times daily. Do not drink alcohol while taking this medicine. 10/21/16   Bland, Scott, DO  montelukast (SINGULAIR) 10 MG tablet Take 1 tablet (10 mg total) by mouth at bedtime. 11/14/19   Volney American, PA-C  norgestimate-ethinyl estradiol (SPRINTEC 28) 0.25-35 MG-MCG tablet Take 1 tablet by mouth daily. 10/21/16   Sherene Sires, DO    Family History Family History  Family history unknown: Yes    Social History Social History   Tobacco Use  . Smoking status: Never Smoker  . Smokeless tobacco: Never Used  Substance Use Topics  . Alcohol use: Yes  . Drug use: No     Allergies   Citrus   Review of Systems Review of Systems PER HPI    Physical Exam Triage Vital Signs ED Triage Vitals  Enc Vitals Group     BP 11/14/19 1139 123/72     Pulse Rate 11/14/19 1139 71     Resp 11/14/19 1139 17     Temp 11/14/19 1139 99 F (37.2 C)     Temp Source 11/14/19 1139 Oral     SpO2 11/14/19 1139 100 %  Weight --      Height --      Head Circumference --      Peak Flow --      Pain Score 11/14/19 1140 0     Pain Loc --      Pain Edu? --      Excl. in [place]? --    No data found.  Updated Vital Signs BP 123/72 (BP Location: Right Arm)   Pulse 71   Temp 99 F (37.2 C) (Oral)   Resp 17   LMP 10/29/2019   SpO2 100%   Visual Acuity Right Eye Distance:   Left Eye Distance:   Bilateral Distance:    Right Eye Near:   Left Eye Near:    Bilateral Near:     Physical Exam Vitals and nursing note reviewed.  Constitutional:      Appearance: Normal appearance. She is not ill-appearing.  HENT:     Head: Atraumatic.     Right Ear: Tympanic membrane normal.     Left Ear: Tympanic  membrane normal.     Nose: Nose normal. No congestion.     Mouth/Throat:     Mouth: Mucous membranes are moist.     Pharynx: Posterior oropharyngeal erythema present.  Eyes:     Extraocular Movements: Extraocular movements intact.     Conjunctiva/sclera: Conjunctivae normal.  Cardiovascular:     Rate and Rhythm: Normal rate and regular rhythm.     Heart sounds: Normal heart sounds.  Pulmonary:     Effort: Pulmonary effort is normal. No respiratory distress.     Breath sounds: Normal breath sounds. No wheezing or rales.  Abdominal:     General: Bowel sounds are normal.     Palpations: Abdomen is soft.  Musculoskeletal:        General: Normal range of motion.     Cervical back: Normal range of motion and neck supple.  Skin:    General: Skin is warm and dry.  Neurological:     Mental Status: She is alert and oriented to person, place, and time.  Psychiatric:        Mood and Affect: Mood normal.        Thought Content: Thought content normal.        Judgment: Judgment normal.    UC Treatments / Results  Labs (all labs ordered are listed, but only abnormal results are displayed) Labs Reviewed  SARS CORONAVIRUS 2 (TAT 6-24 HRS)    EKG   Radiology No results found.  Procedures Procedures (including critical care time)  Medications Ordered in UC Medications - No data to display  Initial Impression / Assessment and Plan / UC Course  I have reviewed the triage vital signs and the nursing notes.  Pertinent labs & imaging results that were available during my care of the patient were reviewed by me and considered in my medical decision making (see chart for details).     Suspect inflammatory related to poorly controlled allergies, possibly some intermittent asthma. Add singulair to antihistamine regimen, albuterol inhaler prn for chest tightness, wheezing. Continue mucinex, supportive home care. F/u if not resolving. COVID pcr pending though low suspicion. Isolate until  results are back.   Final Clinical Impressions(s) / UC Diagnoses   Final diagnoses:  Cough  Seasonal allergic rhinitis due to other allergic trigger   Discharge Instructions   None    ED Prescriptions    Medication Sig Dispense Auth. Provider   montelukast (SINGULAIR) 10 MG  tablet Take 1 tablet (10 mg total) by mouth at bedtime. 30 tablet Volney American, [place]   albuterol (VENTOLIN HFA) 108 (90 Base) MCG/ACT inhaler Inhale 1-2 puffs into the lungs every 6 (six) hours as needed for wheezing or shortness of breath. 18 g Volney American, [place]     PDMP not reviewed this encounter.   Volney American, [place] 11/14/19 1257

## 2019-11-14 NOTE — ED Triage Notes (Signed)
Pt presents with non productive cough and shortness of breath X 3 weeks.

## 2019-11-14 NOTE — ED Notes (Signed)
Pt called once in lobby and once by phone with no answer.

## 2019-11-28 ENCOUNTER — Ambulatory Visit: Payer: Medicaid Other | Admitting: Family Medicine

## 2019-12-29 ENCOUNTER — Other Ambulatory Visit: Payer: Self-pay

## 2019-12-29 ENCOUNTER — Ambulatory Visit: Payer: Medicaid Other | Admitting: Family Medicine

## 2019-12-29 ENCOUNTER — Encounter: Payer: Self-pay | Admitting: Family Medicine

## 2019-12-29 VITALS — BP 112/78 | HR 70 | Ht 62.0 in | Wt 214.0 lb

## 2019-12-29 DIAGNOSIS — R45851 Suicidal ideations: Secondary | ICD-10-CM

## 2019-12-29 DIAGNOSIS — T7840XS Allergy, unspecified, sequela: Secondary | ICD-10-CM

## 2019-12-29 NOTE — Patient Instructions (Addendum)
It was great meeting you today!  Please check-out at the front desk before leaving the clinic. I'd like to see you back in 1 week but if you need to be seen earlier than that for any new issues we're happy to fit you in, just give [place] a call!   Feel free to call with any questions or concerns at any time, at [phone].  Visit Remembers: - Call to set up an appointment with behavioral health.    [facility] [address] [place], [place] [facility] [phone]   . If you are thinking about harming yourself or having thoughts of suicide, or if you know someone who is, seek help right away. . Call your doctor or mental health care provider. . Call 911 or go to a hospital emergency room to get immediate help, or ask a friend or family member to help you do these things. . Call the [place] National Suicide Prevention Lifeline's toll-free, 24-hour hotline at 1-800-273-TALK 601-653-3238) or TTY: 1-800-799-4 TTY 6517299212) to talk to a trained counselor. . If you are in crisis, make sure you are not left alone.  . If someone else is in crisis, make sure he or she is not left alone   [facility] [employer] (Domestic Violence, Rape & Victim Assistance [phone]  [facility]    (ONLY from 8am-4pm)    [phone]  [facility] (24/7)   [phone]  [place] National Suicide Hotline   [phone] Diamantina Monks)    Take care,   Dr. Rushie [facility]

## 2019-12-29 NOTE — Progress Notes (Signed)
New Patient Office Visit  Subjective:  Patient ID: Carolyn Alvarez, female    DOB: April 13, 1990  Age: 29 y.o. MRN: [id number]  CC:  Chief Complaint  Patient presents with  . Establish Care    HPI Carolyn Alvarez presents to re-establish care.  Patient was previously a [facility] patient.   PMHx: Eczema, seasonal allergies, asthma    Meds: Albuterol, Allegra   ALL: Citrus, dust, pollen. KNDA    SurgHx: wisdom tooth extraction    GYNHx: G3P1    FMHx: grandfather - heart disease, paternal side with hx of diabetes and HTN.     Social Hx:  Lives with daughter and her mom.  Goes to the gym once a week step class exercise. Smokes cigars occasionally. Drinks alcohol 2-3 week, mostly 2-3 shots of liquor when she does drink.    Social History   Socioeconomic History  . Marital status: Single    Spouse name: Not on file  . Number of children: Not on file  . Years of education: Not on file  . Highest education level: Not on file  Occupational History  . Not on file  Tobacco Use  . Smoking status: Never Smoker  . Smokeless tobacco: Never Used  Substance and Sexual Activity  . Alcohol use: Yes  . Drug use: No  . Sexual activity: Not on file  Other Topics Concern  . Not on file  Social History Narrative  . Not on file   Social Determinants of Health   Financial Resource Strain:   . Difficulty of Paying Living Expenses: Not on file  Food Insecurity:   . Worried About Charity fundraiser in the Last Year: Not on file  . Ran Out of Food in the Last Year: Not on file  Transportation Needs:   . Lack of Transportation (Medical): Not on file  . Lack of Transportation (Non-Medical): Not on file  Physical Activity:   . Days of Exercise per Week: Not on file  . Minutes of Exercise per Session: Not on file  Stress:   . Feeling of Stress : Not on file  Social Connections:   . Frequency of Communication with Friends and Family: Not on file  . Frequency of Social  Gatherings with Friends and Family: Not on file  . Attends Religious Services: Not on file  . Active Member of Clubs or Organizations: Not on file  . Attends Archivist Meetings: Not on file  . Marital Status: Not on file  Intimate Partner Violence:   . Fear of Current or Ex-Partner: Not on file  . Emotionally Abused: Not on file  . Physically Abused: Not on file  . Sexually Abused: Not on file     Review of Systems  Constitutional: Negative for fever.  HENT: Negative for sore throat.   Respiratory: Negative for cough and shortness of breath.   Gastrointestinal: Negative for abdominal pain, diarrhea, nausea and vomiting.  Genitourinary: Negative for dysuria.  Musculoskeletal: Negative for back pain and neck pain.  Skin: Negative for rash.  Neurological: Negative for headaches.  Psychiatric/Behavioral: Positive for suicidal ideas.   Objective:   Today's Vitals: BP 112/78   Pulse 70   Ht 5\' 2"  (1.575 m)   Wt 214 lb (97.1 kg)   LMP 12/29/2019   SpO2 98%   BMI 39.14 kg/m   Physical Exam Vitals and nursing note reviewed.  Constitutional:      Appearance: Normal appearance. She is obese.  HENT:  Head: Normocephalic and atraumatic.     Nose: Nose normal.  Eyes:     Conjunctiva/sclera: Conjunctivae normal.     Pupils: Pupils are equal, round, and reactive to light.  Cardiovascular:     Rate and Rhythm: Normal rate and regular rhythm.     Pulses: Normal pulses.  Pulmonary:     Effort: Pulmonary effort is normal. No respiratory distress.     Breath sounds: Normal breath sounds.  Abdominal:     Palpations: Abdomen is soft.     Tenderness: There is no abdominal tenderness.  Musculoskeletal:     Cervical back: Normal range of motion and neck supple.  Skin:    General: Skin is warm and dry.     Capillary Refill: Capillary refill takes less than 2 seconds.  Neurological:     Mental Status: She is alert and oriented to person, place, and time. Mental status is  at baseline.  Psychiatric:        Attention and Perception: Attention normal.        Mood and Affect: Mood normal. Affect is tearful.        Speech: Speech normal.        Behavior: Behavior normal.        Thought Content: Thought content includes suicidal ideation. Thought content does not include homicidal ideation. Thought content does not include homicidal or suicidal plan.        Cognition and Memory: Cognition normal.        Judgment: Judgment normal.     Assessment & Plan:   Problem List Items Addressed This Visit      Other   Suicidal ideation - Primary    Patient with hx of psychiatric hospitalization about 9 years ago.  Screening questions for bipolar disease were positive.  She is not currently on medications. Per chart review, patient previously on Celexa.  Due to concerns of bipolar disease deferred starting SSRI/SNRI today.Encouraged patient to establish with psychiatrist and to get a therapist.  She does not currently have appointment.  She has feels there is a lot going on in her life and has extra stress.  She is doing what is best for her daughter.  States that she had her child taken from her when she was younger.  Contracted for safety.  Protective factors include her daughter who is 60.  Of note, daughter also endorses suicidal ideation.  Question if this family has underlying bipolar disease.  Patient denies history of bipolar disease.  Resources for [facility] behavioral health center given.  Suicide hotline number provided.  Patient to follow-up in clinic in 2 weeks.  Hopefully, patient has established with therapist and or psychiatrist by follow-up.       Other Visit Diagnoses    Allergy, sequela       Relevant Orders   Ambulatory referral to Allergy     Allergy: Patient would like to know if she has additional allergies and to see if citrus allergy still exists. Referral placed to allergy for evaluation for allergy testing.   Suicidal Ideations: Patient  with hx of psychiatric hospitalization about 9 years ago.  Screening questions for bipolar disease were positive.  She is not currently on medications. Per chart review, patient previously on Celexa.  Due to concerns of bipolar disease deferred starting SSRI/SNRI today.Encouraged patient to establish with psychiatrist and to get a therapist.  She does not currently have appointment.  She has feels there is a lot going on in  her life and has extra stress.  She is doing what is best for her daughter.  States that she had her child taken from her when she was younger.  Contracted for safety.  Protective factors include her daughter who is 72.  Of note, daughter also endorses suicidal ideation.  Question if pt has underlying bipolar disease.  Patient denies history of bipolar disease.  Precepted with Dr. Hartford Poli, clinical psychologist.  Resources for [facility] resources and [facility] behavioral health center contact information given.  Suicide hotline number provided.  Patient to follow-up in clinic in 2 weeks.  Hopefully, patient has established with therapist and or psychiatrist by follow-up.  Outpatient Encounter Medications as of 12/29/2019  Medication Sig  . fexofenadine (ALLEGRA) 60 MG tablet Take 60 mg by mouth 2 (two) times daily.  . [DISCONTINUED] montelukast (SINGULAIR) 10 MG tablet Take 1 tablet (10 mg total) by mouth at bedtime.  Marland Kitchen albuterol (VENTOLIN HFA) 108 (90 Base) MCG/ACT inhaler Inhale 1-2 puffs into the lungs every 6 (six) hours as needed for wheezing or shortness of breath. (Patient not taking: Reported on 12/29/2019)  . ibuprofen (ADVIL,MOTRIN) 800 MG tablet Take 1 tablet (800 mg total) by mouth every 8 (eight) hours as needed for moderate pain. (Patient not taking: Reported on 07/20/2016)  . [DISCONTINUED] fluconazole (DIFLUCAN) 150 MG tablet Take one tablet by mouth if develop yeast infection after antibiotics (Patient not taking: Reported on 07/20/2016)  . [DISCONTINUED] metoCLOPramide  (REGLAN) 10 MG tablet Take 1 tablet (10 mg total) by mouth every 8 (eight) hours as needed for nausea or vomiting (can also be taken for headache). (Patient not taking: Reported on 12/29/2019)  . [DISCONTINUED] metroNIDAZOLE (FLAGYL) 500 MG tablet Take 1 tablet (500 mg total) by mouth 2 (two) times daily. Do not drink alcohol while taking this medicine. (Patient not taking: Reported on 12/29/2019)  . [DISCONTINUED] norgestimate-ethinyl estradiol (SPRINTEC 28) 0.25-35 MG-MCG tablet Take 1 tablet by mouth daily. (Patient not taking: Reported on 12/29/2019)   No facility-administered encounter medications on file as of 12/29/2019.    Follow-up: Return in about 2 weeks (around 01/12/2020).   Lyndee Hensen, DO

## 2019-12-31 DIAGNOSIS — R45851 Suicidal ideations: Secondary | ICD-10-CM

## 2019-12-31 HISTORY — DX: Suicidal ideations: R45.851
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
## 2020-01-16 ENCOUNTER — Ambulatory Visit: Payer: Medicaid Other | Admitting: Family Medicine

## 2020-01-25 ENCOUNTER — Encounter (HOSPITAL_COMMUNITY): Payer: Self-pay

## 2020-01-25 ENCOUNTER — Other Ambulatory Visit: Payer: Self-pay

## 2020-01-25 ENCOUNTER — Ambulatory Visit (HOSPITAL_COMMUNITY)
Admission: EM | Admit: 2020-01-25 | Discharge: 2020-01-25 | Disposition: A | Discharge status: Home / Self Care | Payer: Medicaid Other | Attending: Psychiatry | Admitting: Psychiatry

## 2020-01-25 DIAGNOSIS — F338 Other recurrent depressive disorders: Secondary | ICD-10-CM | POA: Diagnosis not present

## 2020-01-25 DIAGNOSIS — F331 Major depressive disorder, recurrent, moderate: Secondary | ICD-10-CM

## 2020-01-25 HISTORY — DX: Unspecified asthma, uncomplicated: J45.909

## 2020-01-25 HISTORY — DX: Depression, unspecified: F32.A

## 2020-01-25 NOTE — ED Triage Notes (Signed)
Received Carolyn Alvarez at the [facility] this AM, she was out running errands and decided to stop after visiting her doctor before the Thanksgiving holiday. She has been feeling depressed since 2009 after the birth off her daughter. She was treated with Zoloft for  5-6 years then stopped taking it. She went to see her doctor last month and she wanted her  to be assessed for depression and or r/o Bipolar. She has been sleeping 1-2 hours, excessive spending money and hypersexual with a gradual increase in the above activities.
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
## 2020-01-25 NOTE — Discharge Instructions (Addendum)

## 2020-02-08 ENCOUNTER — Ambulatory Visit: Payer: Medicaid Other | Admitting: Family Medicine

## 2020-02-13 ENCOUNTER — Ambulatory Visit: Payer: Medicaid Other | Admitting: Family Medicine

## 2020-02-20 ENCOUNTER — Encounter: Payer: Self-pay | Admitting: Allergy & Immunology

## 2020-02-20 ENCOUNTER — Other Ambulatory Visit: Payer: Self-pay

## 2020-02-20 ENCOUNTER — Ambulatory Visit (INDEPENDENT_AMBULATORY_CARE_PROVIDER_SITE_OTHER): Payer: Medicaid Other | Admitting: Allergy & Immunology

## 2020-02-20 VITALS — BP 118/80 | HR 70 | Temp 97.6°F | Resp 18 | Ht 61.0 in | Wt 220.6 lb

## 2020-02-20 DIAGNOSIS — J302 Other seasonal allergic rhinitis: Secondary | ICD-10-CM | POA: Diagnosis not present

## 2020-02-20 DIAGNOSIS — J4520 Mild intermittent asthma, uncomplicated: Secondary | ICD-10-CM | POA: Diagnosis not present

## 2020-02-20 DIAGNOSIS — L239 Allergic contact dermatitis, unspecified cause: Secondary | ICD-10-CM | POA: Diagnosis not present

## 2020-02-20 DIAGNOSIS — J3089 Other allergic rhinitis: Secondary | ICD-10-CM | POA: Diagnosis not present

## 2020-02-20 DIAGNOSIS — T7800XD Anaphylactic reaction due to unspecified food, subsequent encounter: Secondary | ICD-10-CM

## 2020-02-20 NOTE — Patient Instructions (Addendum)
1. Seasonal and perennial allergic rhinitis - Testing today showed: trees, indoor molds and dust mites - Copy of test results provided.  - Avoidance measures provided. - Continue with: Allegra (fexofenadine) 180mg  table once daily - Start taking: Flonase (fluticasone) one spray per nostril daily - You can use an extra dose of the antihistamine, if needed, for breakthrough symptoms.  - Consider nasal saline rinses 1-2 times daily to remove allergens from the nasal cavities as well as help with mucous clearance (this is especially helpful to do before the nasal sprays are given) - Consider allergy shots as a means of long-term control. - Allergy shots "re-train" and "reset" the immune system to ignore environmental allergens and decrease the resulting immune response to those allergens (sneezing, itchy watery eyes, runny nose, nasal congestion, etc).    - Allergy shots improve symptoms in 75-85% of patients.  - We can discuss more at the next appointment if the medications are not working for you.  2. Mild intermittent asthma, uncomplicated  - Lung testing look great today. - I do not think that you need a controller medication. - Continue with albuterol as needed.  3. Anaphylactic shock due to food - Testing was negative to orange as well as shellfish. - We are going to get blood work to confirm this. - We will call you in 1 to 2 weeks with the results of the testing.  4. Allergic contact dermatitis - I think it would be useful to do patch testing on you to see what kind of chemicals might be triggering a lot of your symptoms. - You can even bring different brands of hair and we can do patch testing to those. - These are placed on Mondays and then read on Wednesdays and Fridays. - You can remain on Allegra during this week.  5. Return in about 4 weeks (around 03/19/2020). And then patch testing, next available.   Please inform 05/17/2020 of any Emergency Department visits, hospitalizations, or  changes in symptoms. Call us before going to the ED for breathing or allergy symptoms since we might be able to fit you in for a sick visit. Feel free to contact us anytime with any questions, problems, or concerns.  It was a pleasure to meet you today!  Websites that have reliable patient information: 1. American Academy of Asthma, Allergy, and Immunology: www.aaaai.org 2. Food Allergy Research and Education (FARE): foodallergy.org 3. Mothers of Asthmatics: http://www.asthmacommunitynetwork.org 4. American College of Allergy, Asthma, and Immunology: www.acaai.org   COVID-19 Vaccine Information can be found at: [place] For questions related to vaccine distribution or appointments, please email [email or web address] or call [phone].     "Like" [phone] on Facebook and Instagram for our latest updates!       Make sure you are registered to vote! If you have moved or changed any of your contact information, you will need to get this updated before voting!  In some cases, you MAY be able to register to vote online: [place]    Control of Mold Allergen   Mold and fungi can grow on a variety of surfaces provided certain temperature and moisture conditions exist.  Outdoor molds grow on plants, decaying vegetation and soil.  The major outdoor mold, Alternaria and Cladosporium, are found in very high numbers during hot and dry conditions.  Generally, a late Summer - Fall peak is seen for common outdoor fungal spores.  Rain will temporarily lower outdoor mold spore count, but counts rise rapidly when the rainy period ends.  The most important indoor molds are Aspergillus and Penicillium.  Dark, humid and poorly ventilated basements are ideal sites for mold growth.  The next most common sites of mold growth are the bathroom and the kitchen.    Indoor (Perennial) Mold Control   Positive  indoor molds via skin testing: Aspergillus, Penicillium, Fusarium, Aureobasidium ([place]a) and Rhizopus  1. Maintain humidity below 50%. 2. Clean washable surfaces with 5% bleach solution. 3. Remove sources e.g. contaminated carpets.     Reducing Pollen Exposure  The American Academy of Allergy, Asthma and Immunology suggests the following steps to reduce your exposure to pollen during allergy seasons.    1. Do not hang sheets or clothing out to dry; pollen may collect on these items. 2. Do not mow lawns or spend time around freshly cut grass; mowing stirs up pollen. 3. Keep windows closed at night.  Keep car windows closed while driving. 4. Minimize morning activities outdoors, a time when pollen counts are usually at their highest. 5. Stay indoors as much as possible when pollen counts or humidity is high and on windy days when pollen tends to remain in the air longer. 6. Use air conditioning when possible.  Many air conditioners have filters that trap the pollen spores. 7. Use a HEPA room air filter to remove pollen form the indoor air you breathe.  Control of Dust Mite Allergen    Dust mites play a major role in allergic asthma and rhinitis.  They occur in environments with high humidity wherever human skin is found.  Dust mites absorb humidity from the atmosphere (ie, they do not drink) and feed on organic matter (including shed human and animal skin).  Dust mites are a microscopic type of insect that you cannot see with the naked eye.  High levels of dust mites have been detected from mattresses, pillows, carpets, upholstered furniture, bed covers, clothes, soft toys and any woven material.  The principal allergen of the dust mite is found in its feces.  A gram of dust may contain 1,000 mites and 250,000 fecal particles.  Mite antigen is easily measured in the air during house cleaning activities.  Dust mites do not bite and do not cause harm to humans, other than by triggering  allergies/asthma.    Ways to decrease your exposure to dust mites in your home:  1. Encase mattresses, box springs and pillows with a mite-impermeable barrier or cover   2. Wash sheets, blankets and drapes weekly in hot water (130 F) with detergent and dry them in a dryer on the hot setting.  3. Have the room cleaned frequently with a vacuum cleaner and a damp dust-mop.  For carpeting or rugs, vacuuming with a vacuum cleaner equipped with a high-efficiency particulate air (HEPA) filter.  The dust mite allergic individual should not be in a room which is being cleaned and should wait 1 hour after cleaning before going into the room. 4. Do not sleep on upholstered furniture (eg, couches).   5. If possible removing carpeting, upholstered furniture and drapery from the home is ideal.  Horizontal blinds should be eliminated in the rooms where the person spends the most time (bedroom, study, television room).  Washable vinyl, roller-type shades are optimal. 6. Remove all non-washable stuffed toys from the bedroom.  Wash stuffed toys weekly like sheets and blankets above.   7. Reduce indoor humidity to less than 50%.  Inexpensive humidity monitors can be purchased at most hardware stores.  Do not use a  humidifier as can make the problem worse and are not recommended.  Allergy Shots   Allergies are the result of a chain reaction that starts in the immune system. Your immune system controls how your body defends itself. For instance, if you have an allergy to pollen, your immune system identifies pollen as an invader or allergen. Your immune system overreacts by producing antibodies called Immunoglobulin E (IgE). These antibodies travel to cells that release chemicals, causing an allergic reaction.  The concept behind allergy immunotherapy, whether it is received in the form of shots or tablets, is that the immune system can be desensitized to specific allergens that trigger allergy symptoms. Although it  requires time and patience, the payback can be long-term relief.  How Do Allergy Shots Work?  Allergy shots work much like a vaccine. Your body responds to injected amounts of a particular allergen given in increasing doses, eventually developing a resistance and tolerance to it. Allergy shots can lead to decreased, minimal or no allergy symptoms.  There generally are two phases: build-up and maintenance. Build-up often ranges from three to six months and involves receiving injections with increasing amounts of the allergens. The shots are typically given once or twice a week, though more rapid build-up schedules are sometimes used.  The maintenance phase begins when the most effective dose is reached. This dose is different for each person, depending on how allergic you are and your response to the build-up injections. Once the maintenance dose is reached, there are longer periods between injections, typically two to four weeks.  Occasionally doctors give cortisone-type shots that can temporarily reduce allergy symptoms. These types of shots are different and should not be confused with allergy immunotherapy shots.  Who Can Be Treated with Allergy Shots?  Allergy shots may be a good treatment approach for people with allergic rhinitis (hay fever), allergic asthma, conjunctivitis (eye allergy) or stinging insect allergy.   Before deciding to begin allergy shots, you should consider:  . The length of allergy season and the severity of your symptoms . Whether medications and/or changes to your environment can control your symptoms . Your desire to avoid long-term medication use . Time: allergy immunotherapy requires a major time commitment . Cost: may vary depending on your insurance coverage  Allergy shots for children age 52 and older are effective and often well tolerated. They might prevent the onset of new allergen sensitivities or the progression to asthma.  Allergy shots are not  started on patients who are pregnant but can be continued on patients who become pregnant while receiving them. In some patients with other medical conditions or who take certain common medications, allergy shots may be of risk. It is important to mention other medications you talk to your allergist.   When Will I Feel Better?  Some may experience decreased allergy symptoms during the build-up phase. For others, it may take as long as 12 months on the maintenance dose. If there is no improvement after a year of maintenance, your allergist will discuss other treatment options with you.  If you aren't responding to allergy shots, it may be because there is not enough dose of the allergen in your vaccine or there are missing allergens that were not identified during your allergy testing. Other reasons could be that there are high levels of the allergen in your environment or major exposure to non-allergic triggers like tobacco smoke.  What Is the Length of Treatment?  Once the maintenance dose is reached, allergy shots  are generally continued for three to five years. The decision to stop should be discussed with your allergist at that time. Some people may experience a permanent reduction of allergy symptoms. Others may relapse and a longer course of allergy shots can be considered.  What Are the Possible Reactions?  The two types of adverse reactions that can occur with allergy shots are local and systemic. Common local reactions include very mild redness and swelling at the injection site, which can happen immediately or several hours after. A systemic reaction, which is less common, affects the entire body or a particular body system. They are usually mild and typically respond quickly to medications. Signs include increased allergy symptoms such as sneezing, a stuffy nose or hives.  Rarely, a serious systemic reaction called anaphylaxis can develop. Symptoms include swelling in the throat, wheezing,  a feeling of tightness in the chest, nausea or dizziness. Most serious systemic reactions develop within 30 minutes of allergy shots. This is why it is strongly recommended you wait in your doctor's office for 30 minutes after your injections. Your allergist is trained to watch for reactions, and his or her staff is trained and equipped with the proper medications to identify and treat them.  Who Should Administer Allergy Shots?  The preferred location for receiving shots is your prescribing allergist's office. Injections can sometimes be given at another facility where the physician and staff are trained to recognize and treat reactions, and have received instructions by your prescribing allergist.

## 2020-02-20 NOTE — Progress Notes (Signed)
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 Seasonal and perennial allergic rhinitis - Testing today showed: trees, indoor molds and dust mites - Copy of test results provided.  - Avoidance measures provided. - Continue with: Allegra (fexofenadine) 180mg  table once daily - Start taking: Flonase (fluticasone) one spray per nostril daily - You can use an extra dose of the antihistamine, if needed, for breakthrough symptoms.  - Consider nasal saline rinses 1-2 times daily to remove allergens from the nasal cavities as well as help with mucous clearance (this is especially helpful to do before the nasal sprays are given) - Consider allergy shots as a means of long-term control. - Allergy shots "re-train" and "reset" the immune system to ignore environmental allergens and decrease the resulting immune response to those allergens (sneezing, itchy watery eyes, runny nose, nasal congestion, etc).    - Allergy shots improve symptoms in 75-85% of patients.  - We can discuss more at the next appointment if the medications are not working for you.  2. Mild intermittent asthma, uncomplicated  - Lung testing look great today. - I do not think that you need a controller medication. - Continue with albuterol as needed.  3. Anaphylactic shock due to food - Testing was negative to orange as well as shellfish. - We are going to get blood work to confirm this. - We will call you in 1 to 2 weeks with the results of the testing.  4. Allergic contact dermatitis - I think it would be useful to do  patch testing on you to see what kind of chemicals might be triggering a lot of your symptoms. - You can even bring different brands of hair and we can do patch testing to those. - These are placed on Mondays and then read on Wednesdays and Fridays. - You can remain on Allegra during this week.  5. Return in about 4 weeks (around 03/19/2020) for a regular office and then patch testing, next available.  Subjective:   Carolyn Alvarez is a 30 y.o. female presenting today for evaluation of  Chief Complaint  Patient presents with  . Allergic Reaction    Has reactions to synthetic hair, citrus, pollen, and latex   . Allergy Testing    Carolyn Alvarez has a history of the following: Patient Active Problem List   Diagnosis Date Noted  . Suicidal ideation 12/31/2019  . History of gonorrhea 08/01/2014  . Anemia 07/30/2014  . Seasonal allergies 06/21/2013  . Oligomenorrhea 03/13/2011  . Menorrhagia with irregular cycle 04/28/2010  . DEPRESSIVE DISORDER 11/27/2008  . ECZEMA, ATOPIC 11/27/2008    History obtained from: chart review and patient.  [name] was referred by Lyndee Hensen, DO.     Carolyn Alvarez is a 30 y.o. female presenting for an evaluation of multiple atopic complaints.  She reports that when she got pregnant, everything made things worse.    Asthma/Respiratory Symptom History: She was diagnosed with albuterol around two or three months ago. She has albuterol to use as needed. She does not prednisone and has never been in the hospital for her breathing.  Allergic Rhinitis  Symptom History: She takes Allegra every day. She has been on it daily for two years. She has coughing and hiving without the Allegra. She does report that she reacted to a cat, but it caused a lot of allergic symptoms. She did have some sneezing and coughing around the cats. She does not have any dogs. There is carpeting in the bedroom.  Food Allergy Symptom History: She made lemon  poundcake and lemonade when she was pregnant. She developed hives from this every time that she ate it. That was her craving and she ate it once per week. She has issues with lemon even to this day. 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 This happens with other citrus too, including oranges. 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   She does have an allergy to synthetic hair but she is wearing a wig today. This is all synthetic hair. She manages this by keeping it off of her face or keeping it in a style that keeps it from her skin.   Otherwise, there is no history of other atopic diseases, including drug allergies, stinging insect allergies or contact dermatitis. There is no significant infectious history. Vaccinations are up to date.    Past Medical History: Patient Active Problem List   Diagnosis Date Noted  . Suicidal ideation 12/31/2019  . History of gonorrhea 08/01/2014  . Anemia 07/30/2014  . Seasonal allergies 06/21/2013  . Oligomenorrhea 03/13/2011  . Menorrhagia with irregular cycle 04/28/2010  . DEPRESSIVE DISORDER 11/27/2008  . ECZEMA, ATOPIC 11/27/2008    Medication List:  Allergies as of 02/20/2020      Reactions   Citrus Itching, Rash      Medication List       Accurate as of February 20, 2020  1:33 PM. If you have any questions, ask your nurse or doctor.        albuterol 108 (90 Base) MCG/ACT inhaler Commonly known as: VENTOLIN HFA Inhale 1-2 puffs into the lungs every 6 (six) hours as needed for wheezing or shortness of breath.   fexofenadine 60 MG tablet Commonly known as: ALLEGRA Take 60 mg by mouth 2 (two) times daily.   ibuprofen 800 MG tablet Commonly known as: ADVIL Take 1 tablet (800 mg total) by mouth every 8 (eight) hours as needed for moderate pain.       Birth History: non-contributory  Developmental History: non-contributory  Past Surgical History: Past Surgical History:   Procedure Laterality Date  . WISDOM TOOTH EXTRACTION       Family History: Family History  Problem Relation Age of Onset  . Asthma Mother      Social History: Carolyn Alvarez lives at home with her family.  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  She does smoke marijuana very rarely. There are no pets at home.  There was a cat several years ago, but she got rid of the cat.   Review of Systems  Constitutional: Negative.  Negative for chills, fever, malaise/fatigue and weight loss.  HENT: Positive for congestion and sinus pain. Negative for ear discharge and ear pain.   Eyes: Negative for pain, discharge and redness.  Respiratory: Negative for cough, sputum production, shortness of breath and wheezing.   Cardiovascular: Negative.  Negative for chest pain and palpitations.  Gastrointestinal: Negative for abdominal pain, constipation, diarrhea, heartburn, nausea and vomiting.  Skin: Positive for itching and rash.  Neurological: Negative  for dizziness and headaches.  Endo/Heme/Allergies: Positive for environmental allergies. Does not bruise/bleed easily.       Objective:   Blood pressure 118/80, pulse 70, temperature 97.6 F (36.4 C), resp. rate 18, height 5\' 1"  (1.549 m), weight 220 lb 9.6 oz (100.1 kg), SpO2 100 %. Body mass index is 41.68 kg/m.   Physical Exam:   Physical Exam Constitutional:      Appearance: She is well-developed.     Comments: Very pleasant.  Talkative and smiling.  HENT:     Head: Normocephalic and atraumatic.     Right Ear: Tympanic membrane, ear canal and external ear normal. No drainage, swelling or tenderness. Tympanic membrane is not injected, scarred, erythematous, retracted or bulging.     Left Ear: Tympanic membrane, ear canal and external ear normal. No drainage, swelling or tenderness. Tympanic membrane is not injected, scarred, erythematous, retracted or bulging.     Nose: No nasal deformity, septal deviation,  mucosal edema, rhinorrhea or epistaxis.     Right Turbinates: Enlarged, swollen and pale.     Left Turbinates: Enlarged, swollen and pale.     Right Sinus: No maxillary sinus tenderness or frontal sinus tenderness.     Left Sinus: No maxillary sinus tenderness or frontal sinus tenderness.     Mouth/Throat:     Mouth: Oropharynx is clear and moist. Mucous membranes are not pale and not dry.     Pharynx: Uvula midline.  Eyes:     General:        Right eye: No discharge.        Left eye: No discharge.     Extraocular Movements: EOM normal.     Conjunctiva/sclera: Conjunctivae normal.     Right eye: Right conjunctiva is not injected. No chemosis.    Left eye: Left conjunctiva is not injected. No chemosis.    Pupils: Pupils are equal, round, and reactive to light.  Cardiovascular:     Rate and Rhythm: Normal rate and regular rhythm.     Heart sounds: Normal heart sounds.  Pulmonary:     Effort: Pulmonary effort is normal. No tachypnea, accessory muscle usage or respiratory distress.     Breath sounds: Normal breath sounds. No wheezing, rhonchi or rales.     Comments: Moving air well in all lung fields. No increased work of breathing noted.   Chest:     Chest wall: No tenderness.  Abdominal:     Tenderness: There is no abdominal tenderness. There is no guarding or rebound.  Lymphadenopathy:     Head:     Right side of head: No submandibular, tonsillar or occipital adenopathy.     Left side of head: No submandibular, tonsillar or occipital adenopathy.     Cervical: No cervical adenopathy.  Skin:    Coloration: Skin is not pale.     Findings: No abrasion, erythema, petechiae or rash. Rash is not papular, urticarial or vesicular.  Neurological:     Mental Status: She is alert.  Psychiatric:        Mood and Affect: Mood and affect normal.        Behavior: Behavior is cooperative.      Diagnostic studies:    Spirometry: results normal (FEV1: 1.93/78%, FVC: 2.60/90%, FEV1/FVC:  74%).    Spirometry consistent with normal pattern.   Allergy Studies:     Airborne Adult Perc - 02/20/20 0928    Time Antigen Placed [place]    Location  Back    Number of Test 59    Panel 1 Select    1. Control-Buffer 50% Glycerol Negative    2. Control-Histamine 1 mg/ml 2+    3. Albumin saline Negative    4. [place] Negative    5. [place] Negative    6. Johnson Negative    7. [place] Blue Negative    8. Meadow Fescue Negative    9. Perennial Rye Negative    10. Sweet Vernal Negative    11. Timothy Negative    12. Cocklebur Negative    13. Burweed Marshelder Negative    14. Ragweed, short Negative    15. Ragweed, Giant Negative    16. Plantain,  English Negative    17. Lamb's Quarters Negative    18. Sheep Sorrell Negative    19. Rough Pigweed Negative    20. Marsh Elder, Rough Negative    21. Mugwort, Common Negative    22. Ash mix Negative    23. Birch mix Negative    24. Beech American Negative    25. Box, Elder Negative    26. Cedar, red Negative    27. Cottonwood, [place] Negative    28. Elm mix Negative    29. Hickory Negative    30. Maple mix Negative    31. Oak, [place] mix Negative    32. Pecan Pollen Negative    33. Pine mix Negative    34. Sycamore Eastern Negative    35. [place], Black Pollen Negative    36. Alternaria alternata Negative    37. Cladosporium Herbarum Negative    38. Aspergillus mix Negative    39. Penicillium mix Negative    40. Bipolaris sorokiniana (Helminthosporium) Negative    41. Drechslera spicifera (Curvularia) Negative    42. Mucor plumbeus Negative    43. Fusarium moniliforme Negative    44. Aureobasidium pullulans (pullulara) Negative    45. Rhizopus oryzae Negative    46. Botrytis cinera Negative    47. Epicoccum nigrum Negative    48. Phoma betae Negative    49. Candida Albicans Negative    50. Trichophyton mentagrophytes Negative    51. Mite, D Farinae  5,000 AU/ml Negative    52. Mite,  D Pteronyssinus  5,000 AU/ml Negative    53. Cat Hair 10,000 BAU/ml Negative    54.  Dog Epithelia Negative    55. Mixed Feathers Negative    56. Horse Epithelia Negative    57. Cockroach, German Negative    58. Mouse Negative    59. Tobacco Leaf Negative          Intradermal - 02/20/20 1022    Time Antigen Placed 1022    Allergen Manufacturer Greer    Location Arm    Number of Test 15    Intradermal Select    Control Negative    Guatem[place] Negative    Johnson Negative    7 Grass Negative    Ragweed mix Negative    Weed mix Negative    Tree mix 1+    Mold 1 Negative    Mold 2 2+    Mold 3 Negative    Mold 4 2+    Cat Negative    Dog Negative    Cockroach Negative    Mite mix 3+          Food Adult Perc - 02/20/20 1000    8. Shellfish Mix Negative   9. Fish Mix Negative   25. Shrimp  Negative   26. Crab Negative   27. Lobster Negative   28. Oyster Negative   29. Scallops Negative   56. Orange  Negative          Allergy testing results were read and interpreted by myself, documented by clinical staff.         Salvatore Marvel, MD [facility] [place] [facility] [place]

## 2020-02-23 LAB — ALLERGY PANEL 19, SEAFOOD GROUP
Allergen Salmon IgE: <0.1 kU/L
Catfish: <0.1 kU/L
Codfish IgE: <0.1 kU/L
F023-IgE Crab: 0.62 kU/L — AB
F080-IgE Lobster: 0.35 kU/L — AB
Shrimp IgE: 0.87 kU/L — AB
Tuna: <0.1 kU/L

## 2020-02-23 LAB — ALLERGEN PROFILE, FOOD-CITRUS
Allergen Grapefruit IgE: <0.1 kU/L
Allergen Lime IgE: <0.1 kU/L
Lemon: <0.1 kU/L
Orange: <0.1 kU/L
Tangerine IgE: <0.1 kU/L

## 2020-02-23 LAB — TRYPTASE: Tryptase: 4.1 ug/L (ref 2.2–13.2)

## 2020-03-04 ENCOUNTER — Ambulatory Visit: Payer: Medicaid Other | Admitting: Family

## 2020-03-11 ENCOUNTER — Encounter: Payer: Self-pay | Admitting: Family

## 2020-03-11 ENCOUNTER — Ambulatory Visit (INDEPENDENT_AMBULATORY_CARE_PROVIDER_SITE_OTHER): Payer: Medicaid Other | Admitting: Family

## 2020-03-11 ENCOUNTER — Telehealth: Payer: Self-pay | Admitting: Family

## 2020-03-11 ENCOUNTER — Other Ambulatory Visit: Payer: Self-pay

## 2020-03-11 VITALS — BP 118/80 | HR 65 | Temp 98.0°F | Resp 18

## 2020-03-11 DIAGNOSIS — L239 Allergic contact dermatitis, unspecified cause: Secondary | ICD-10-CM | POA: Diagnosis not present

## 2020-03-11 NOTE — Progress Notes (Signed)
Follow-up Note  RE: [name] MRN: [id number] DOB: 01-16-1991 Date of Office Visit: 03/11/2020  Primary care provider: Lyndee Hensen, DO Referring provider: Lyndee Hensen, DO   Carolyn Alvarez returns to the office today for the patch test placement, given suspected history of contact dermatitis.    Diagnostics: True Test patches placed.    Plan:   Allergic contact dermatitis - Instructions provided on care of the patches for the next 48 hours. - [name] was instructed to avoid showering for the next 48 hours. - [name] will follow up in 48 hours and 96 hours for patch readings.  Thank you for the opportunity to care for [name].  Please let us know if you have any questions.  Althea Charon, FNP [facility] [place] [facility] [place]

## 2020-03-11 NOTE — Telephone Encounter (Signed)
This is the patient that Azure spoke with you about.

## 2020-03-11 NOTE — Telephone Encounter (Signed)
Patient had a patch put on today, 03/11/20. She took a nap and when she woke up, it was bent. She could not come in this afternoon due to work. I spoke to Shelton and he said for her to come in first thing Tuesday morning, 03/12/20, to have it fixed.

## 2020-03-11 NOTE — Telephone Encounter (Signed)
Called and spoke with the patient and she is going to come in tomorrow for another patch placement since hers came off while she was napping. She will also bring a portion of synthetic hair to place. She will have her 48 hour reading on Thursday and 96 hour reading on Monday.

## 2020-03-13 ENCOUNTER — Ambulatory Visit: Payer: Medicaid Other | Admitting: Allergy

## 2020-03-13 ENCOUNTER — Encounter: Payer: Self-pay | Admitting: Allergy

## 2020-03-13 ENCOUNTER — Other Ambulatory Visit: Payer: Self-pay

## 2020-03-13 NOTE — Progress Notes (Signed)
Pt returns for patch read.  She however states a family member turned up the heat in the house and she sweat overnight the night after patch placement and this caused the panels to move and bunch up on itself.   All 3 panels today were displaced and thus test is not interpretable today.  Advised she reschedule to have patches replaced and recommended we reinforce the panels with more taping.

## 2020-03-15 ENCOUNTER — Encounter: Payer: Medicaid Other | Admitting: Allergy

## 2020-03-19 ENCOUNTER — Ambulatory Visit: Payer: Medicaid Other | Admitting: Family

## 2020-03-23 NOTE — Patient Instructions (Incomplete)
1. Seasonal and perennial allergic rhinitis (trees, indoor molds, and dust mite) - Continue with: Allegra (fexofenadine) 180mg  table once daily - Start taking: Flonase (fluticasone) one spray per nostril daily - You can use an extra dose of the antihistamine, if needed, for breakthrough symptoms.  - Consider nasal saline rinses 1-2 times daily to remove allergens from the nasal cavities as well as help with mucous clearance (this is especially helpful to do before the nasal sprays are given) - Consider allergy shots as a means of long-term control. - Allergy shots "re-train" and "reset" the immune system to ignore environmental allergens and decrease the resulting immune response to those allergens (sneezing, itchy watery eyes, runny nose, nasal congestion, etc).    - Allergy shots improve symptoms in 75-85% of patients.  - We can discuss more at the next appointment if the medications are not working for you.  2. Mild intermittent asthma, uncomplicated  - Continue with albuterol 2 puffs every 4-6 hours as needed for cough, wheeze, tightness in chest, or shortness of breath.  3. Anaphylactic shock due to food Continue to avoid oranges and shellfish. In case of an allergic reaction, give Benadryl *** {Blank single:19197::"teaspoonful","teaspoonfuls","capsules"} every {blank single:19197::"4","6"} hours, and if life-threatening symptoms occur, inject with {Blank single:19197::"EpiPen 0.3 mg","EpiPen 0.15 mg","AuviQ 0.3 mg","AuviQ 0.15 mg","AuviQ 0.10 mg"}.  4. Allergic contact dermatitis TRUE patch testing placed Schedule appointments for this  Wednesday and Friday (48 hour and 96 hour patch test readings) Ok to remain on Allegra (fexofendadine).  Please let us know if this treatment plan is not working well for you. Schedule 48 hour and 96 hour patch test reading appointments and then follow up appointment in 3 months

## 2020-03-25 ENCOUNTER — Ambulatory Visit: Payer: Medicaid Other | Admitting: Family

## 2020-03-25 ENCOUNTER — Telehealth: Payer: Self-pay

## 2020-03-25 NOTE — Telephone Encounter (Signed)
Called to see if patient wanted to reschedule her missed appointment with Althea Charon for another date, left a message for patient to call our office back.

## 2020-03-27 ENCOUNTER — Encounter: Payer: Medicaid Other | Admitting: Family Medicine

## 2020-03-29 ENCOUNTER — Ambulatory Visit (INDEPENDENT_AMBULATORY_CARE_PROVIDER_SITE_OTHER): Payer: Medicaid Other | Admitting: Behavioral Health

## 2020-03-29 ENCOUNTER — Encounter: Payer: Medicaid Other | Admitting: Family Medicine

## 2020-03-29 ENCOUNTER — Other Ambulatory Visit: Payer: Self-pay

## 2020-03-29 DIAGNOSIS — F332 Major depressive disorder, recurrent severe without psychotic features: Secondary | ICD-10-CM | POA: Diagnosis not present

## 2020-04-01 NOTE — Progress Notes (Signed)
   THERAPIST PROGRESS NOTE  Virtual Visit via Video Note  I connected with [place] on 04/01/20 at  3:00 PM EST by a video enabled telemedicine application and verified that I am speaking with the correct person using two identifiers.  Location: Patient: [place], [place]  Provider: [facility]   I discussed the limitations of evaluation and management by telemedicine and the availability of in person appointments. The patient expressed understanding and agreed to proceed.  I discussed the assessment and treatment plan with the patient. The patient was provided an opportunity to ask questions and all were answered. The patient agreed with the plan and demonstrated an understanding of the instructions.   The patient was advised to call back or seek an in-person evaluation if the symptoms worsen or if the condition fails to improve as anticipated.  I provided 60 minutes of non-face-to-face time during this encounter.   [name], Counselor   Session Time: 3:00PM  Participation Level: Active  Behavioral Response: UTA-VirtualAlertPleasant  Type of Therapy: Individual Therapy  Treatment Goals addressed: Coping  Interventions: CBT and Solution Focused  Summary: CAROLINE LONGIE is a 30 y.o. female who presents virtually for an individual therapy f/u session. Clt had a Comprehensive Clinical Assessment completed on 01/25/2020 when she presented to [facility]. Clt reports she was initially seen by her primary care provider when she was recommended to [facility] to [facility] for depressive sxs. Clt states she never followed-up with the recommendations given to her at the time she presented to [facility], until now. Clt started session today, by stating "I'm not good and my house looks like it". Clt reports her depressive sxs have increased over the past few weeks which prompted her to schedule and OPT appointment. She talked about having issues with seeking validation  from others, not feeling adequate or not feeling "good enough", crying spells, low motivation, and low self-esteem. Clt completed Nutrition Assessment, Pain Assessment, and PHQ 2 & 9. Relative to her PHQ-2 & 9 score of a 21 - clt reported increased sxs of depression. Clt shared that she use to engage in drinking alcohol and sex as coping mechanisms for her depression. She described her depression as "often wanting to escape in a hole"; however, she adamantly denied having thoughts of suicide or self-harm. Clt was receptive to support and encouragement. Clt denied SI/HI/AVH. Clt was alert and oriented x4 while pleasant in her mood. Clt became tearful throughout session as she discussed her current stressors. Clt was scheduled for a f/u visit with this Probation officer.   Suicidal/Homicidal: No  Therapist Response: Therapist offered encouragement and support. Therapist talked about healthy coping skills and signs to recognize increase stress and depression. Therapist encouraged clt to use support system and engage in a hobby that will help her to feel better about herself.  Plan: Return again in 2 weeks.  Diagnosis: Axis I: Major Depression, Recurrent severe    Axis II: No diagnosis    [name], Counselor 04/01/2020

## 2020-04-19 NOTE — Patient Instructions (Addendum)
1. Seasonal and perennial allergic rhinitis (trees, indoor molds, and dust mite) -Start azelastine 1-2 sprays each nostril twice a day as needed for runny nose/drainage down throat - Continue with: Allegra (fexofenadine) 180mg  table once daily - Continue taking: Flonase (fluticasone) one spray per nostril daily - You can use an extra dose of the antihistamine, if needed, for breakthrough symptoms.  - Consider nasal saline rinses 1-2 times daily to remove allergens from the nasal cavities as well as help with mucous clearance (this is especially helpful to do before the nasal sprays are given) - Consider allergy shots as a means of long-term control. - Allergy shots "re-train" and "reset" the immune system to ignore environmental allergens and decrease the resulting immune response to those allergens (sneezing, itchy watery eyes, runny nose, nasal congestion, etc).    - Allergy shots improve symptoms in 75-85% of patients.  - We can discuss more at the next appointment if the medications are not working for you.  2. Mild persistent asthma, uncomplicated -Start Flovent 110 mcg 2 puffs twice a day with spacer to help prevent cough and wheeze  - Continue with albuterol 2 puffs every 4-6 hours as needed for cough, wheeze, tightness in chest, or shortness of breath.  3. Anaphylactic shock due to food Continue to avoid citrus and shellfish. In case of an allergic reaction, give Benadryl 4 teaspoonfuls every 4 hours, and if life-threatening symptoms occur, inject with EpiPen 0.3 mg. Emergency action plan given  4. Allergic contact dermatitis TRUE patch testing placed Schedule appointments for this  Wednesday and Friday (48 hour and 96 hour patch test readings) Ok to remain on Allegra (fexofendadine).  Please let us know if this treatment plan is not working well for you. Schedule 48 hour and 96 hour patch test reading appointments and then follow up appointment in 3 months Also, schedule follow up  appointment in 2 months to follow up on your breathing and allergy symptoms

## 2020-04-22 ENCOUNTER — Encounter: Payer: Self-pay | Admitting: Family

## 2020-04-22 ENCOUNTER — Other Ambulatory Visit: Payer: Self-pay

## 2020-04-22 ENCOUNTER — Ambulatory Visit: Payer: Medicaid Other | Admitting: Family Medicine

## 2020-04-22 ENCOUNTER — Ambulatory Visit (INDEPENDENT_AMBULATORY_CARE_PROVIDER_SITE_OTHER): Payer: Medicaid Other | Admitting: Family

## 2020-04-22 VITALS — BP 118/80 | HR 63 | Resp 18 | Ht 61.0 in | Wt 204.6 lb

## 2020-04-22 DIAGNOSIS — J302 Other seasonal allergic rhinitis: Secondary | ICD-10-CM

## 2020-04-22 DIAGNOSIS — T7800XD Anaphylactic reaction due to unspecified food, subsequent encounter: Secondary | ICD-10-CM

## 2020-04-22 DIAGNOSIS — L239 Allergic contact dermatitis, unspecified cause: Secondary | ICD-10-CM

## 2020-04-22 DIAGNOSIS — J3089 Other allergic rhinitis: Secondary | ICD-10-CM | POA: Diagnosis not present

## 2020-04-22 DIAGNOSIS — J4530 Mild persistent asthma, uncomplicated: Secondary | ICD-10-CM | POA: Diagnosis not present

## 2020-04-22 MED ORDER — FLOVENT HFA 110 MCG/ACT IN AERO: 2.0000 | INHALATION_SPRAY | Freq: Two times a day (BID) | RESPIRATORY_TRACT | 3 refills | Status: DC

## 2020-04-22 MED ORDER — EPINEPHRINE 0.3 MG/0.3ML IJ SOAJ: 0.3000 mg | INTRAMUSCULAR | 1 refills | Status: DC | PRN

## 2020-04-22 MED ORDER — AZELASTINE HCL 0.1 % NA SOLN: NASAL | 5 refills | Status: DC

## 2020-04-22 MED ORDER — FLUTICASONE PROPIONATE 50 MCG/ACT NA SUSP: 1.0000 | Freq: Every day | NASAL | 5 refills | Status: DC

## 2020-04-22 NOTE — Addendum Note (Signed)
Addended by: Chip Boer R on: 04/22/2020 12:21 PM   Modules accepted: Orders

## 2020-04-22 NOTE — Progress Notes (Signed)
**Note De-Identified Carolyn Obfuscation** [place] [address] Dept: [phone]  FOLLOW UP NOTE  Patient ID: Carolyn Alvarez, female    DOB: 03/04/1990  Age: 30 y.o. MRN: [id number] Date of Office Visit: 04/22/2020  Assessment  Chief Complaint: No chief complaint on file.  HPI Carolyn Alvarez is a 30 year old female who presents today for follow-up of seasonal and perennial allergic rhinitis, mild intermittent asthma, anaphylactic shock due to food, and allergic contact dermatitis.  She was last seen on February 20, 2020 by Dr. Ernst Bowler.  Seasonal and perennial allergic rhinitis is reported as not well controlled with Allegra 180 mg once a day.  She has been out Flonase nasal spray and is not using saline rinse.  She reports rhinorrhea that is at times clear, yellow, or green.  She also reports sore throat at times, nasal congestion, sneezing, and postnasal drip.  She denies any sinus tenderness, fever or chills.  Mild intermittent asthma is reported as not well controlled with albuterol as needed.  She is currently been using her albuterol inhaler 5 to 6 days a week.  She reports wheezing, tightness in her chest at times, shortness of breath at times, and nocturnal awakenings only if the heat is up at night.  She denies any coughing.  Since her last office visit she has not required any systemic steroids or made any trips to the emergency room or urgent care due to breathing problems.  She continues to avoid citrus and shellfish without any accidental ingestion.  She is here today for True Patch test application.  She forgot to bring a piece of synthetic hair.  She originally had a piece in her car and then cleaned her car out it out and it got thrown away.  Drug Allergies:  Allergies  Allergen Reactions  . Citrus Itching and Rash    Review of Systems: Review of Systems  Constitutional: Negative for chills and fever.  HENT:       Reports rhinorrhea that at times can be clear, yellow, or  green.  Also reports nasal congestion, postnasal drip, sneezing and sore throat at times.  She denies sinus tenderness  Respiratory: Positive for shortness of breath and wheezing. Negative for cough.   Cardiovascular: Negative for chest pain and palpitations.  Gastrointestinal: Positive for heartburn. Negative for abdominal pain.       Reports heartburn with spicy foods  Genitourinary: Negative for dysuria.  Skin: Negative for itching and rash.  Neurological: Positive for headaches.  Endo/Heme/Allergies: Positive for environmental allergies.    Physical Exam: BP 118/80 (BP Location: Right Arm, Patient Position: Sitting, Cuff Size: Large)   Pulse 63   Resp 18   Ht 5\' 1"  (1.549 m)   Wt 204 lb 9.6 oz (92.8 kg)   SpO2 98%   BMI 38.66 kg/m    Physical Exam Constitutional:      Appearance: Normal appearance.  HENT:     Head: Normocephalic and atraumatic.     Comments: Pharynx normal, eyes normal, ears: Unable to see bilateral tympanic membranes due to cerumen.  Nose bilateral lower turbinate moderately edematous and pale with no drainage noted    Right Ear: Ear canal and external ear normal.     Left Ear: Ear canal and external ear normal.     Mouth/Throat:     Mouth: Mucous membranes are moist.     Pharynx: Oropharynx is clear.  Eyes:     Conjunctiva/sclera: Conjunctivae normal.  Cardiovascular:     Rate  and Rhythm: Regular rhythm.     Heart sounds: Normal heart sounds.  Pulmonary:     Effort: Pulmonary effort is normal.     Breath sounds: Normal breath sounds.     Comments: Lungs clear to auscultation Musculoskeletal:     Cervical back: Neck supple.  Skin:    General: Skin is warm.  Neurological:     Mental Status: She is alert and oriented to person, place, and time.  Psychiatric:        Mood and Affect: Mood normal.        Behavior: Behavior normal.        Thought Content: Thought content normal.        Judgment: Judgment normal.     Diagnostics:  TRUE Patch test  applied  FVC 2.61 L, FEV1 1.92 L.  Predicted FVC 2.88 L, FEV1 2.46 L.  Spirometry indicates normal ventilatory function.  Status post bronchodilator response shows FVC 3.90 L FEV1 2.11 L 190 cc change in FEV1.  Assessment and Plan: 1. Mild persistent asthma without complication   2. Allergic contact dermatitis, unspecified trigger   3. Seasonal and perennial allergic rhinitis   4. Anaphylactic shock due to food, subsequent encounter     Meds ordered this encounter  Medications  . fluticasone (FLONASE) 50 MCG/ACT nasal spray    Sig: Place 1 spray into both nostrils daily.    Dispense:  18.2 mL    Refill:  5  . EPINEPHrine 0.3 mg/0.3 mL IJ SOAJ injection    Sig: Inject 0.3 mg into the muscle as needed for anaphylaxis.    Dispense:  1 each    Refill:  1  . azelastine (ASTELIN) 0.1 % nasal spray    Sig: Use 1-2 sprays each nostril twice a day as needed for runny nose/drainage down throat    Dispense:  30 mL    Refill:  5  . fluticasone (FLOVENT HFA) 110 MCG/ACT inhaler    Sig: Inhale 2 puffs into the lungs in the morning and at bedtime.    Dispense:  1 each    Refill:  3    Patient Instructions  1. Seasonal and perennial allergic rhinitis (trees, indoor molds, and dust mite) -Start azelastine 1-2 sprays each nostril twice a day as needed for runny nose/drainage down throat - Continue with: Allegra (fexofenadine) 180mg  table once daily - Continue taking: Flonase (fluticasone) one spray per nostril daily - You can use an extra dose of the antihistamine, if needed, for breakthrough symptoms.  - Consider nasal saline rinses 1-2 times daily to remove allergens from the nasal cavities as well as help with mucous clearance (this is especially helpful to do before the nasal sprays are given) - Consider allergy shots as a means of long-term control. - Allergy shots "re-train" and "reset" the immune system to ignore environmental allergens and decrease the resulting immune response to those  allergens (sneezing, itchy watery eyes, runny nose, nasal congestion, etc).    - Allergy shots improve symptoms in 75-85% of patients.  - We can discuss more at the next appointment if the medications are not working for you.  2. Mild persistent asthma, uncomplicated -Start Flovent 110 mcg 2 puffs twice a day with spacer to help prevent cough and wheeze  - Continue with albuterol 2 puffs every 4-6 hours as needed for cough, wheeze, tightness in chest, or shortness of breath.  3. Anaphylactic shock due to food Continue to avoid citrus and shellfish. In case of  an allergic reaction, give Benadryl 4 teaspoonfuls every 4 hours, and if life-threatening symptoms occur, inject with EpiPen 0.3 mg. Emergency action plan given  4. Allergic contact dermatitis TRUE patch testing placed Schedule appointments for this  Wednesday and Friday (48 hour and 96 hour patch test readings) Ok to remain on Allegra (fexofendadine).  Please let us know if this treatment plan is not working well for you. Schedule 48 hour and 96 hour patch test reading appointments and then follow up appointment in 3 months Also, schedule follow up appointment in 2 months to follow up on your breathing and allergy symptoms     Return in about 2 days (around 04/24/2020), or if symptoms worsen or fail to improve, for Also, schedule 2 month follow up .    Thank you for the opportunity to care for this patient.  Please do not hesitate to contact me with questions.  Althea Charon, FNP [facility]

## 2020-04-23 ENCOUNTER — Ambulatory Visit (INDEPENDENT_AMBULATORY_CARE_PROVIDER_SITE_OTHER): Payer: Medicaid Other | Admitting: Behavioral Health

## 2020-04-23 DIAGNOSIS — F332 Major depressive disorder, recurrent severe without psychotic features: Secondary | ICD-10-CM

## 2020-04-23 NOTE — Progress Notes (Signed)
° °  THERAPIST PROGRESS NOTE  Virtual Visit via Video Note  I connected with Carolyn Alvarez on 04/27/20 at  4:00 PM EDT by a video enabled telemedicine application and verified that I am speaking with the correct person using two identifiers.  Location: Patient: [name], [place] Provider: [facility]   I discussed the limitations of evaluation and management by telemedicine and the availability of in person appointments. The patient expressed understanding and agreed to proceed.   I discussed the assessment and treatment plan with the patient. The patient was provided an opportunity to ask questions and all were answered. The patient agreed with the plan and demonstrated an understanding of the instructions.   The patient was advised to call back or seek an in-person evaluation if the symptoms worsen or if the condition fails to improve as anticipated.  I provided 60 minutes of non-face-to-face time during this encounter.   [name], Counselor   Session Time: 4:00PM  Participation Level: Active  Behavioral Response: NAAlertIrritable  Type of Therapy: Individual Therapy  Treatment Goals addressed: Coping  Interventions: CBT and Strength-based  Summary: Carolyn Alvarez is a 30 y.o. female who presents today for a scheduled virtual individual therapy session. Clt reports her current stressors to be "trying to find my space and my way", financial stressors, having limited support from her family, establishing boundaries with her mother, and starting new employment. Clt report she started a second job to help her to get out of the house more often. She share that she has been trying to do more things for herself to promote self-care and to work towards her personal career goals. Clt appeared to be very frustrated as she talked about her mother not respecting her boundaries.   Suicidal/Homicidal: No  Therapist Response: Therapist offered encouragement and  support. Therapist discussed healthy boundaries in interpersonal relationships. Therapist process triggers for client's depressive sxs.  Plan: Return again in 2 weeks.  Diagnosis: Axis I: Major Depression, Recurrent severe    Axis II: No diagnosis    [name], Counselor 04/23/2020

## 2020-04-24 ENCOUNTER — Other Ambulatory Visit: Payer: Self-pay

## 2020-04-24 ENCOUNTER — Encounter: Payer: Self-pay | Admitting: Allergy

## 2020-04-24 ENCOUNTER — Ambulatory Visit: Payer: Medicaid Other | Admitting: Allergy

## 2020-04-24 VITALS — Temp 97.7°F

## 2020-04-24 DIAGNOSIS — L239 Allergic contact dermatitis, unspecified cause: Secondary | ICD-10-CM | POA: Insufficient documentation

## 2020-04-24 NOTE — Assessment & Plan Note (Signed)
TRUE test removed today.  Patches 1 and 2 were in contact with the skin  Patches 3 - the top half was folded over and #25, 26, 31, 32 is not interpretable as it was not in contact with the skin during the 48 hour removal appointment.  48 hour reading all negative so far.

## 2020-04-24 NOTE — Progress Notes (Signed)
   Follow Up Note  RE: Carolyn Alvarez MRN: [id number] DOB: 1990/04/11 Date of Office Visit: 04/24/2020  Referring provider: Lyndee Hensen, DO Primary care provider: Lyndee Hensen, DO  History of Present Illness: I had the pleasure of seeing Carolyn Alvarez for a follow up visit at the [facility] on 04/24/2020. She is a 30 y.o. female, who is being followed for seasonal and perennial allergic rhinitis, mild intermittent asthma, anaphylactic shock due to food, and allergic contact dermatitis. Today she is here for initial patch test interpretation, given suspected history of contact dermatitis.   Diagnostics:  TRUE TEST 48 hour reading:   T.R.U.E. Test - 04/24/20 0945    Time Antigen Placed 0945    Manufacturer Other    Lot # [id number]    Location Back    Number of Test 36    1. Nickel Sulfate 0    2. Wool Alcohols 0    3. Neomycin Sulfate 0    4. Potassium Dichromate 0    5. Caine Mix 0    6. Fragrance Mix 0    7. Colophony 0    8. Paraben Mix 0    9. Negative Control 0    10. Balsam of [place] 0    11. Ethylenediamine Dihydrochloride 0    12. Cobalt Dichloride 0    13. p-tert Butylphenol Formaldehyde Resin 0    14. Epoxy Resin 0    15. Carba Mix 0    16.  Black Rubber Mix 0    17. Cl+ Me-Isothiazolinone 0    18. Quaternium-15 0    19. Methyldibromo Glutaronitrile 0    20. p-Phenylenediamine 0    21. Formaldehyde 0    22. Mercapto Mix 0    23. Thimerosal 0    24. Thiuram Mix 0    25. Diazolidinyl Urea 0    26. Quinoline Mix 0    27. Tixocortol-21-Pivalate 0    28. Gold Sodium Thiosulfate 0    29. Imidazolidinyl Urea 0    30. Budesonide 0    31. Hydrocortisone-17-Butyrate 0    32. Mercaptobenzothiazole 0    33. Bacitracin 0    34. Parthenolide 0    35. Disperse Blue 106 0    36. 2-Bromo-2-Nitropropane-1,3-diol 0            Assessment and Plan: Carolyn Alvarez is a 30 y.o. female with: Allergic contact dermatitis TRUE test removed  today.  Patches 1 and 2 were in contact with the skin  Patches 3 - the top half was folded over and #25, 26, 31, 32 is not interpretable as it was not in contact with the skin during the 48 hour removal appointment.  48 hour reading all negative so far.   Return in about 2 days (around 04/26/2020) for Patch reading.  It was my pleasure to see Carolyn Alvarez today and participate in her care. Please feel free to contact me with any questions or concerns.  Sincerely,  Rexene Alberts, DO Allergy & Immunology  [facility] office: [phone] [place] office: [place] office: [phone]

## 2020-04-26 ENCOUNTER — Other Ambulatory Visit: Payer: Self-pay

## 2020-04-26 ENCOUNTER — Encounter: Payer: Medicaid Other | Admitting: Family Medicine

## 2020-04-26 ENCOUNTER — Ambulatory Visit (INDEPENDENT_AMBULATORY_CARE_PROVIDER_SITE_OTHER): Payer: Medicaid Other | Admitting: Allergy

## 2020-04-26 ENCOUNTER — Encounter: Payer: Self-pay | Admitting: Allergy

## 2020-04-26 VITALS — Temp 98.6°F

## 2020-04-26 DIAGNOSIS — L239 Allergic contact dermatitis, unspecified cause: Secondary | ICD-10-CM

## 2020-04-26 NOTE — Patient Instructions (Addendum)
True patch testing showed: Borderline positive to epoxy resin, p-Phenylenediamine. Please avoid the above items. Safe list will be emailed to you.   Follow up with Dr. Ernst Bowler in 1-2 months.   1. Seasonal and perennial allergic rhinitis - skin testing in the past showed: trees, indoor molds and dust mites - Continue with: Allegra (fexofenadine) 180mg  table once daily - Start taking: Flonase (fluticasone) one spray per nostril daily - You can use an extra dose of the antihistamine, if needed, for breakthrough symptoms.  - Consider nasal saline rinses 1-2 times daily to remove allergens from the nasal cavities as well as help with mucous clearance (this is especially helpful to do before the nasal sprays are given) - Consider allergy shots as a means of long-term control. - Allergy shots "re-train" and "reset" the immune system to ignore environmental allergens and decrease the resulting immune response to those allergens (sneezing, itchy watery eyes, runny nose, nasal congestion, etc).    - Allergy shots improve symptoms in 75-85% of patients.   2. Mild intermittent asthma, uncomplicated  - Continue with albuterol as needed.  3. Anaphylactic shock due to food - continue to avoid shellfish.

## 2020-04-26 NOTE — Progress Notes (Signed)
Follow Up Note  RE: Carolyn Alvarez MRN: [id number] DOB: Dec 31, 1990 Date of Office Visit: 04/26/2020  Referring provider: Lyndee Hensen, DO Primary care provider: Lyndee Hensen, DO  History of Present Illness: I had the pleasure of seeing Carolyn Alvarez for a follow up visit at the [facility] [place] [facility] [place] on 04/26/2020. Carolyn Alvarez is a 30 y.o. female, who is being followed for allergic rhinitis, mild intermittent asthma, anaphylactic shock due to food, and allergic contact dermatitis. Today Carolyn Alvarez is here for final patch test interpretation, given suspected history of contact dermatitis.   Diagnostics:  TRUE TEST 96 hour reading:   T.R.U.E. Test - 04/26/20 1000    Time Antigen Placed 0940    Manufacturer Other    Lot # [id number]    Location Back    Number of Test 36    Reading Interval Day 5    Panel Panel 1;Panel 2;Panel 3    1. Nickel Sulfate 0    2. Wool Alcohols 0    3. Neomycin Sulfate 0    4. Potassium Dichromate 0    5. Caine Mix 0    6. Fragrance Mix 0    7. Colophony 0    8. Paraben Mix 0    9. Negative Control 0    10. Balsam of [place] 0    11. Ethylenediamine Dihydrochloride 0    12. Cobalt Dichloride 0    13. p-tert Butylphenol Formaldehyde Resin 0    14. Epoxy Resin --   +/-   15. Carba Mix 0    16.  Black Rubber Mix 0    17. Cl+ Me-Isothiazolinone 0    18. Quaternium-15 0    19. Methyldibromo Glutaronitrile 0    20. p-Phenylenediamine --   +/-   21. Formaldehyde 0    22. Mercapto Mix 0    23. Thimerosal 0    24. Thiuram Mix 0    25. Diazolidinyl Urea 0    26. Quinoline Mix 0    27. Tixocortol-21-Pivalate 0    28. Gold Sodium Thiosulfate 0    29. Imidazolidinyl Urea 0    30. Budesonide 0    31. Hydrocortisone-17-Butyrate 0    32. Mercaptobenzothiazole 0    33. Bacitracin 0    34. Parthenolide 0    35. Disperse Blue 106 0    36. 2-Bromo-2-Nitropropane-1,3-diol 0            Assessment and Plan: Carolyn Alvarez is a 30 y.o. female  with: Allergic contact dermatitis  True patch testing 2nd reading at 96 hours was: Borderline positive to epoxy resin, p-Phenylenediamine.  Gave handout on the above allergens and advised patient to start to avoid.   Emailed safe list.  Return in about 2 months (around 06/26/2020).  Follow up in 2 months with Dr. Ernst Bowler.   No orders of the defined types were placed in this encounter.  Lab Orders  No laboratory test(s) ordered today    Medication List:  Current Outpatient Medications  Medication Sig Dispense Refill  . albuterol (VENTOLIN HFA) 108 (90 Base) MCG/ACT inhaler Inhale 1-2 puffs into the lungs every 6 (six) hours as needed for wheezing or shortness of breath. 18 g 0  . azelastine (ASTELIN) 0.1 % nasal spray Use 1-2 sprays each nostril twice a day as needed for runny nose/drainage down throat 30 mL 5  . EPINEPHrine 0.3 mg/0.3 mL IJ SOAJ injection Inject 0.3 mg into the muscle as needed for anaphylaxis. 1  each 1  . fexofenadine (ALLEGRA) 60 MG tablet Take 60 mg by mouth 2 (two) times daily.    . fluticasone (FLONASE) 50 MCG/ACT nasal spray Place 1 spray into both nostrils daily. 18.2 mL 5  . fluticasone (FLOVENT HFA) 110 MCG/ACT inhaler Inhale 2 puffs into the lungs in the morning and at bedtime. 1 each 3  . ibuprofen (ADVIL,MOTRIN) 800 MG tablet Take 1 tablet (800 mg total) by mouth every 8 (eight) hours as needed for moderate pain. 42 tablet 0   No current facility-administered medications for this visit.   Allergies: Allergies  Allergen Reactions  . Citrus Itching and Rash   I reviewed her past medical history, social history, family history, and environmental history and no significant changes have been reported from her previous visit.  Review of Systems  Skin: Positive for rash.  Allergic/Immunologic: Positive for environmental allergies and food allergies.   Objective: Temp 98.6 F (37 C)  There is no height or weight on file to calculate BMI. Physical  Exam Vitals and nursing note reviewed.  Constitutional:      Appearance: Normal appearance.  HENT:     Head: Normocephalic and atraumatic.  Neurological:     Mental Status: Carolyn Alvarez is alert.  Psychiatric:        Mood and Affect: Mood normal.        Behavior: Behavior normal.    Previous notes and tests were reviewed. The plan was reviewed with the patient/family, and all questions/concerned were addressed.  It was my pleasure to see Carolyn Alvarez today and participate in her care. Please feel free to contact me with any questions or concerns.  Sincerely,  Rexene Alberts, DO Allergy & Immunology  [facility] office: [phone] [facility] office: [place] office: [phone]

## 2020-04-26 NOTE — Assessment & Plan Note (Addendum)
   True patch testing 2nd reading at 96 hours was: Borderline positive to epoxy resin, p-Phenylenediamine.  Gave handout on the above allergens and advised patient to start to avoid.   Emailed safe list.

## 2020-04-26 NOTE — Addendum Note (Signed)
Addended by: Guy Franco on: 04/26/2020 03:10 PM   Modules accepted: Orders

## 2020-04-29 ENCOUNTER — Ambulatory Visit: Payer: Medicaid Other | Admitting: Family Medicine

## 2020-05-08 ENCOUNTER — Ambulatory Visit (HOSPITAL_COMMUNITY): Payer: Medicaid Other | Admitting: Behavioral Health

## 2020-05-16 ENCOUNTER — Other Ambulatory Visit: Payer: Self-pay

## 2020-05-16 ENCOUNTER — Ambulatory Visit (INDEPENDENT_AMBULATORY_CARE_PROVIDER_SITE_OTHER): Payer: Medicaid Other | Admitting: Behavioral Health

## 2020-05-16 DIAGNOSIS — F339 Major depressive disorder, recurrent, unspecified: Secondary | ICD-10-CM
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
## 2020-05-29 ENCOUNTER — Ambulatory Visit (INDEPENDENT_AMBULATORY_CARE_PROVIDER_SITE_OTHER): Payer: Medicaid Other | Admitting: Behavioral Health

## 2020-05-29 ENCOUNTER — Other Ambulatory Visit: Payer: Self-pay

## 2020-05-29 DIAGNOSIS — F331 Major depressive disorder, recurrent, moderate: Secondary | ICD-10-CM

## 2020-06-05 ENCOUNTER — Encounter: Payer: Self-pay | Admitting: Allergy

## 2020-06-05 ENCOUNTER — Ambulatory Visit (INDEPENDENT_AMBULATORY_CARE_PROVIDER_SITE_OTHER): Payer: Medicaid Other | Admitting: Allergy

## 2020-06-05 ENCOUNTER — Other Ambulatory Visit: Payer: Self-pay

## 2020-06-05 VITALS — BP 120/84 | HR 80 | Temp 98.6°F | Resp 18 | Ht 61.0 in | Wt 226.6 lb

## 2020-06-05 DIAGNOSIS — L239 Allergic contact dermatitis, unspecified cause: Secondary | ICD-10-CM

## 2020-06-05 DIAGNOSIS — J4530 Mild persistent asthma, uncomplicated: Secondary | ICD-10-CM | POA: Insufficient documentation

## 2020-06-05 DIAGNOSIS — J3089 Other allergic rhinitis: Secondary | ICD-10-CM

## 2020-06-05 DIAGNOSIS — T7800XD Anaphylactic reaction due to unspecified food, subsequent encounter: Secondary | ICD-10-CM

## 2020-06-05 DIAGNOSIS — J302 Other seasonal allergic rhinitis: Secondary | ICD-10-CM | POA: Insufficient documentation

## 2020-06-05 DIAGNOSIS — T7800XA Anaphylactic reaction due to unspecified food, initial encounter: Secondary | ICD-10-CM | POA: Insufficient documentation

## 2020-06-05 HISTORY — DX: Mild persistent asthma, uncomplicated: J45.30

## 2020-06-05 HISTORY — DX: Other seasonal allergic rhinitis: J30.2

## 2020-06-05 MED ORDER — ALBUTEROL SULFATE HFA 108 (90 BASE) MCG/ACT IN AERS: 1.0000 | INHALATION_SPRAY | RESPIRATORY_TRACT | 1 refills | Status: DC | PRN

## 2020-06-05 NOTE — Assessment & Plan Note (Signed)
Past history - skin testing positive to trees, indoor molds and dust mites. Interim history - symptoms flare when forgets to take antihistamines.  Continue environmental control measures.  May use over the counter antihistamines such as Zyrtec (cetirizine), Claritin (loratadine), or Xyzal (levocetirizine) daily as needed. May take twice a day if needed.   May want to change from [place].   May use Flonase (fluticasone) nasal spray 1 spray per nostril twice a day as needed for nasal congestion.   Nasal saline spray (i.e., Simply Saline) or nasal saline lavage (i.e., NeilMed) is recommended as needed and prior to medicated nasal sprays.  Consider allergy injections for long term control if above medications do not help the symptoms - handout given.

## 2020-06-05 NOTE — Progress Notes (Signed)
Follow Up Note  RE: Carolyn Alvarez MRN: [id number] DOB: [birth year] Date of Office Visit: 06/05/2020  Referring provider: Lyndee Hensen, DO Primary care provider: Lyndee Hensen, DO  Chief Complaint: Asthma (ACT -21 Somewhat controlled forgot to talk allergy medication last night so she woke up with coughing and had to use her albuterol inhaler. She also feels the coughing was due to her dust mite allergy she has not had time to clean effectively ), Allergic Reaction (Has been taking her medications still has reactions to lemons, limes and oranges. Hives and itchiness,and coughing. 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 ), and Other (Patient states she has no issues with any of the providers and does not want to talk about switching providers. )  History of Present Illness: I had the pleasure of seeing Carolyn Alvarez for a follow up visit at the [facility] on 06/05/2020. She is a 30 y.o. female, who is being followed for allergic rhinitis, mild intermittent asthma, anaphylactic shock due to food, and allergic contact dermatitis. Her previous allergy office visit was on 04/26/2020 with Dr. Maudie Mercury. Today is a regular follow up visit.  1. Seasonal and perennial allergic rhinitis (trees, indoor molds, and dust mite) Last night had an episode with coughing but she forgot to take her allergy medication last night. Currently taking allegra BID and Flonase 1 spray per nostril daily prn. No nosebleeds. Content with above regimen and does not want to pursue AIT.  2. Mild persistent asthma Currently on Flovent 138mcg 2 puffs twice a day and using albuterol prn. Patient is a server/bar tender and sometimes certain scents and the masks tend to trigger symptoms.   Denies any ER/urgent care visits or prednisone use since the last visit.  3. Anaphylactic shock due to food Avoiding citrus fruits and shellfish. No reactions.   4. Allergic contact  dermatitis Not avoiding any of her positive patch testing results as she was not sure what it was positive to. As long as she takes her antihistamines the rash/itching seems to be better. Not sure if she got the safe list and wants it resent. She does wear synthetic hair which is colored and sometimes has itching.   Assessment and Plan: Carolyn Alvarez is a 30 y.o. female with: Mild persistent asthma without complication Well controlled with Flovent.   Normal spirometry today. . Daily controller medication(s): continue Flovent 173mcg 2 puffs twice a day with spacer and rinse mouth afterwards. . May use albuterol rescue inhaler 2 puffs every 4 to 6 hours as needed for shortness of breath, chest tightness, coughing, and wheezing. May use albuterol rescue inhaler 2 puffs 5 to 15 minutes prior to strenuous physical activities. Monitor frequency of use.  . Repeat spirometry at next visit.   Seasonal and perennial allergic rhinitis Past history - skin testing positive to trees, indoor molds and dust mites. Interim history - symptoms flare when forgets to take antihistamines.  Continue environmental control measures.  May use over the counter antihistamines such as Zyrtec (cetirizine), Claritin (loratadine), or Xyzal (levocetirizine) daily as needed. May take twice a day if needed.   May want to change from [place].   May use Flonase (fluticasone) nasal spray 1 spray per nostril twice a day as needed for nasal congestion.   Nasal saline spray (i.e., Simply Saline) or nasal saline lavage (i.e., NeilMed) is recommended as needed and prior to medicated nasal sprays.  Consider allergy injections for long term control if  above medications do not help the symptoms - handout given.   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  Continue to avoid shellfish and citrus fruits.  For mild symptoms you can take over the counter antihistamines such as Benadryl and monitor symptoms closely.  If symptoms worsen or if you have severe symptoms including breathing issues, throat closure, significant swelling, whole body hives, severe diarrhea and vomiting, lightheadedness then inject epinephrine and seek immediate medical care afterwards.  Allergic contact dermatitis Past history - TRUE Test. Patches 3 - the top half was folded over and #25, 26, 31, 32 is not interpretable as it was not in contact with the skin during the 48 hour removal appointment. Patch testing: Borderline positive to epoxy resin, p-Phenylenediamine. Interim history - itching improved with antihistamines. Does wear wigs with dyes.  Please avoid the above items - handouts given.   Safe list emailed again.  Return in about 4 months (around 10/05/2020).  Meds ordered this encounter  Medications  . albuterol (VENTOLIN HFA) 108 (90 Base) MCG/ACT inhaler    Sig: Inhale 1-2 puffs into the lungs every 4 (four) hours as needed for wheezing or shortness of breath (coughing fits).    Dispense:  18 g    Refill:  1   Lab Orders  No laboratory test(s) ordered today    Diagnostics: Spirometry:  Tracings reviewed. Her effort: Good reproducible efforts. FVC: 2.65L FEV1: 2.05L, 83% predicted FEV1/FVC ratio: 77% Interpretation: Spirometry consistent with normal pattern.  Please see scanned spirometry results for details.  Medication List:  Current Outpatient Medications  Medication Sig Dispense Refill  . azelastine (ASTELIN) 0.1 % nasal spray Use 1-2 sprays each nostril twice a day as needed for runny nose/drainage down throat 30 mL 5  . EPINEPHrine 0.3 mg/0.3 mL IJ SOAJ injection Inject 0.3 mg into the muscle as needed for anaphylaxis. 1 each 1  . fexofenadine (ALLEGRA) 60 MG tablet Take 60 mg by mouth 2 (two) times daily.    . fluticasone (FLONASE) 50 MCG/ACT nasal spray Place 1 spray into both nostrils daily. 18.2 mL 5  . fluticasone (FLOVENT HFA) 110 MCG/ACT inhaler Inhale 2 puffs into the lungs in the morning and  at bedtime. 1 each 3  . ibuprofen (ADVIL,MOTRIN) 800 MG tablet Take 1 tablet (800 mg total) by mouth every 8 (eight) hours as needed for moderate pain. 42 tablet 0  . albuterol (VENTOLIN HFA) 108 (90 Base) MCG/ACT inhaler Inhale 1-2 puffs into the lungs every 4 (four) hours as needed for wheezing or shortness of breath (coughing fits). 18 g 1   No current facility-administered medications for this visit.   Allergies: Allergies  Allergen Reactions  . Citrus Itching and Rash   I reviewed her past medical history, social history, family history, and environmental history and no significant changes have been reported from her previous visit.  Review of Systems  Constitutional: Negative for appetite change, chills, fever and unexpected weight change.  HENT: Negative for congestion and rhinorrhea.   Eyes: Negative for itching.  Respiratory: Negative for cough, chest tightness, shortness of breath and wheezing.   Gastrointestinal: Negative for abdominal pain.  Skin: Negative for rash.  Allergic/Immunologic: Positive for environmental allergies and food allergies.  Neurological: Negative for headaches.   Objective: BP 120/84   Pulse 80   Temp 98.6 F (37 C)   Resp 18   Ht 5\' 1"  (1.549 m)   Wt 226 lb 9.6 oz (102.8 kg)   SpO2 97%  BMI 42.82 kg/m  Body mass index is 42.82 kg/m. Physical Exam Vitals and nursing note reviewed.  Constitutional:      Appearance: Normal appearance. She is well-developed.  HENT:     Head: Normocephalic and atraumatic.     Right Ear: Tympanic membrane and external ear normal.     Left Ear: Tympanic membrane and external ear normal.     Nose: Nose normal.     Mouth/Throat:     Mouth: Mucous membranes are moist.     Pharynx: Oropharynx is clear.  Eyes:     Conjunctiva/sclera: Conjunctivae normal.  Cardiovascular:     Rate and Rhythm: Normal rate and regular rhythm.     Heart sounds: Normal heart sounds. No murmur heard.   Pulmonary:     Effort:  Pulmonary effort is normal.     Breath sounds: Normal breath sounds. No wheezing, rhonchi or rales.  Musculoskeletal:     Cervical back: Neck supple.  Skin:    General: Skin is warm.     Findings: No rash.  Neurological:     Mental Status: She is alert and oriented to person, place, and time.  Psychiatric:        Behavior: Behavior normal.    Previous notes and tests were reviewed. The plan was reviewed with the patient/family, and all questions/concerned were addressed.  It was my pleasure to see Carolyn Alvarez today and participate in her care. Please feel free to contact me with any questions or concerns.  Sincerely,  Rexene Alberts, DO Allergy & Immunology  [facility] [place] office: [place] office: [phone]

## 2020-06-05 NOTE — Assessment & Plan Note (Signed)
No reactions.  Continue to avoid shellfish and citrus fruits.  For mild symptoms you can take over the counter antihistamines such as Benadryl and monitor symptoms closely. If symptoms worsen or if you have severe symptoms including breathing issues, throat closure, significant swelling, whole body hives, severe diarrhea and vomiting, lightheadedness then inject epinephrine and seek immediate medical care afterwards.

## 2020-06-05 NOTE — Assessment & Plan Note (Addendum)
Past history - TRUE Test. Patches 3 - the top half was folded over and #25, 26, 31, 32 is not interpretable as it was not in contact with the skin during the 48 hour removal appointment. Patch testing: Borderline positive to epoxy resin, p-Phenylenediamine. Interim history - itching improved with antihistamines. Does wear wigs with dyes.  Please avoid the above items - handouts given.   Safe list emailed again.

## 2020-06-05 NOTE — Patient Instructions (Addendum)
1. Seasonal and perennial allergic rhinitis  Skin testing in the past showed: trees, indoor molds and dust mites  Continue environmental control measures.  May use over the counter antihistamines such as Zyrtec (cetirizine), Claritin (loratadine), or Xyzal (levocetirizine) daily as needed. May take twice a day if needed.   May want to change from [place].   May use Flonase (fluticasone) nasal spray 1 spray per nostril twice a day as needed for nasal congestion.   Nasal saline spray (i.e., Simply Saline) or nasal saline lavage (i.e., NeilMed) is recommended as needed and prior to medicated nasal sprays.  Consider allergy injections for long term control if above medications do not help the symptoms - handout given.   2. Asthma  . Daily controller medication(s): continue Flovent 15mcg 2 pufs twice a day with spacer and rinse mouth afterwards. . May use albuterol rescue inhaler 2 puffs every 4 to 6 hours as needed for shortness of breath, chest tightness, coughing, and wheezing. May use albuterol rescue inhaler 2 puffs 5 to 15 minutes prior to strenuous physical activities. Monitor frequency of use.  . Asthma control goals:  o Full participation in all desired activities (may need albuterol before activity) o Albuterol use two times or less a week on average (not counting use with activity) o Cough interfering with sleep two times or less a month o Oral steroids no more than once a year o No hospitalizations  3. Anaphylactic shock due to food  Continue to avoid shellfish and citrus fruits.  For mild symptoms you can take over the counter antihistamines such as Benadryl and monitor symptoms closely. If symptoms worsen or if you have severe symptoms including breathing issues, throat closure, significant swelling, whole body hives, severe diarrhea and vomiting, lightheadedness then inject epinephrine and seek immediate medical care afterwards.  4. Contact dermatitis  Patch testing:  Borderline positive to epoxy resin, p-Phenylenediamine.  Please avoid the above items.  Safe list will be emailed to you.   Follow up in 4 months or sooner if needed.   Reducing Pollen Exposure . Pollen seasons: trees (spring), grass (summer) and ragweed/weeds (fall). Marland Kitchen Keep windows closed in your home and car to lower pollen exposure.  Susa Simmonds air conditioning in the bedroom and throughout the house if possible.  . Avoid going out in dry windy days - especially early morning. . Pollen counts are highest between 5 - 10 AM and on dry, hot and windy days.  . Save outside activities for late afternoon or after a heavy rain, when pollen levels are lower.  . Avoid mowing of grass if you have grass pollen allergy. Marland Kitchen Be aware that pollen can also be transported indoors on people and pets.  . Dry your clothes in an automatic dryer rather than hanging them outside where they might collect pollen.  . Rinse hair and eyes before bedtime. Control of House Dust Mite Allergen . Dust mite allergens are a common trigger of allergy and asthma symptoms. While they can be found throughout the house, these microscopic creatures thrive in warm, humid environments such as bedding, upholstered furniture and carpeting. . Because so much time is spent in the bedroom, it is essential to reduce mite levels there.  . Encase pillows, mattresses, and box springs in special allergen-proof fabric covers or airtight, zippered plastic covers.  . Bedding should be washed weekly in hot water (130 F) and dried in a hot dryer. Allergen-proof covers are available for comforters and pillows that can't be  regularly washed.  Wendee Copp the allergy-proof covers every few months. Minimize clutter in the bedroom. Keep pets out of the bedroom.  Marland Kitchen Keep humidity less than 50% by using a dehumidifier or air conditioning. You can buy a humidity measuring device called a hygrometer to monitor this.  . If possible, replace carpets with  hardwood, linoleum, or washable area rugs. If that's not possible, vacuum frequently with a vacuum that has a HEPA filter. . Remove all upholstered furniture and non-washable window drapes from the bedroom. . Remove all non-washable stuffed toys from the bedroom.  Wash stuffed toys weekly. Mold Control . Mold and fungi can grow on a variety of surfaces provided certain temperature and moisture conditions exist.  . Outdoor molds grow on plants, decaying vegetation and soil. The major outdoor mold, Alternaria and Cladosporium, are found in very high numbers during hot and dry conditions. Generally, a late summer - fall peak is seen for common outdoor fungal spores. Rain will temporarily lower outdoor mold spore count, but counts rise rapidly when the rainy period ends. . The most important indoor molds are Aspergillus and Penicillium. Dark, humid and poorly ventilated basements are ideal sites for mold growth. The next most common sites of mold growth are the bathroom and the kitchen. Outdoor (Seasonal) Mold Control . Use air conditioning and keep windows closed. . Avoid exposure to decaying vegetation. Marland Kitchen Avoid leaf raking. . Avoid grain handling. . Consider wearing a face mask if working in moldy areas.  Indoor (Perennial) Mold Control  . Maintain humidity below 50%. . Get rid of mold growth on hard surfaces with water, detergent and, if necessary, 5% bleach (do not mix with other cleaners). Then dry the area completely. If mold covers an area more than 10 square feet, consider hiring an indoor environmental professional. . For clothing, washing with soap and water is best. If moldy items cannot be cleaned and dried, throw them away. . Remove sources e.g. contaminated carpets. . Repair and seal leaking roofs or pipes. Using dehumidifiers in damp basements may be helpful, but empty the water and clean units regularly to prevent mildew from forming. All rooms, especially basements, bathrooms and  kitchens, require ventilation and cleaning to deter mold and mildew growth. Avoid carpeting on concrete or damp floors, and storing items in damp areas.

## 2020-06-05 NOTE — Assessment & Plan Note (Signed)
Well controlled with Flovent.   Normal spirometry today. . Daily controller medication(s): continue Flovent 142mcg 2 puffs twice a day with spacer and rinse mouth afterwards. . May use albuterol rescue inhaler 2 puffs every 4 to 6 hours as needed for shortness of breath, chest tightness, coughing, and wheezing. May use albuterol rescue inhaler 2 puffs 5 to 15 minutes prior to strenuous physical activities. Monitor frequency of use.  . Repeat spirometry at next visit.

## 2020-06-06 ENCOUNTER — Other Ambulatory Visit: Payer: Self-pay | Admitting: *Deleted

## 2020-06-06 MED ORDER — PROAIR HFA 108 (90 BASE) MCG/ACT IN AERS: 2.0000 | INHALATION_SPRAY | RESPIRATORY_TRACT | 1 refills | Status: DC | PRN

## 2020-06-12 ENCOUNTER — Ambulatory Visit (HOSPITAL_COMMUNITY): Payer: Medicaid Other | Admitting: Behavioral Health

## 2020-06-12 ENCOUNTER — Other Ambulatory Visit: Payer: Self-pay

## 2020-06-12 MED ORDER — ALBUTEROL SULFATE HFA 108 (90 BASE) MCG/ACT IN AERS: 2.0000 | INHALATION_SPRAY | RESPIRATORY_TRACT | 1 refills | Status: DC | PRN

## 2020-06-14 ENCOUNTER — Other Ambulatory Visit: Payer: Self-pay

## 2020-06-21 ENCOUNTER — Other Ambulatory Visit: Payer: Self-pay

## 2020-07-10 ENCOUNTER — Other Ambulatory Visit: Payer: Self-pay

## 2020-07-10 ENCOUNTER — Ambulatory Visit (HOSPITAL_COMMUNITY): Payer: Medicaid Other | Admitting: Behavioral Health

## 2020-07-17 ENCOUNTER — Ambulatory Visit (INDEPENDENT_AMBULATORY_CARE_PROVIDER_SITE_OTHER): Payer: Medicaid Other | Admitting: Behavioral Health

## 2020-07-17 ENCOUNTER — Other Ambulatory Visit: Payer: Self-pay

## 2020-07-17 DIAGNOSIS — F331 Major depressive disorder, recurrent, moderate: Secondary | ICD-10-CM | POA: Diagnosis not present

## 2020-07-31 ENCOUNTER — Ambulatory Visit (HOSPITAL_COMMUNITY): Payer: Medicaid Other | Admitting: Behavioral Health

## 2020-08-14 ENCOUNTER — Ambulatory Visit (HOSPITAL_COMMUNITY): Payer: Self-pay | Admitting: Behavioral Health

## 2020-08-14 ENCOUNTER — Other Ambulatory Visit: Payer: Self-pay

## 2020-08-14 ENCOUNTER — Ambulatory Visit (INDEPENDENT_AMBULATORY_CARE_PROVIDER_SITE_OTHER): Payer: Medicaid Other | Admitting: Behavioral Health

## 2020-08-14 DIAGNOSIS — F331 Major depressive disorder, recurrent, moderate: Secondary | ICD-10-CM

## 2020-08-15 NOTE — Progress Notes (Signed)
   THERAPIST PROGRESS NOTE  Session Time: 77mins  Participation Level: Active  Behavioral Response: Neat and Well GroomedAlertEuthymic  Type of Therapy: Individual Therapy  Treatment Goals addressed: Coping  Interventions: CBT  Summary: Carolyn Alvarez is a 30 y.o. female who presents to [facility] for a scheduled individual therapy session. Clt rated her depression at a 9, with 10 being the worst. Clt shared that she has been feeling sad and depressed since she hasn't talked to her friend [name]. Clt was somewhat tearful during today's session. Clt identified using the following self-care practices: "doing my makeup and doing something for myself".   Suicidal/Homicidal: No  Therapist Response:  - Therapist acknowledged client's feelings. - Therapist offered encouragement and support. - Therapist instructed client to identify her current self-care practices. - Therapist assessed client's current wellbeing by using a rating scale. - Therapist instructed client to describe the characteristics of a healthy relationship and setting boundaries.  Plan: Return again in 2 weeks.  Diagnosis: Axis I: Major Depression, Recurrent severe    Axis II: No diagnosis    [name], Counselor 05/29/2020

## 2020-08-15 NOTE — Progress Notes (Signed)
   THERAPIST PROGRESS NOTE  Session Time: 71mins  Participation Level: Active  Behavioral Response: NeatAlertEuthymic  Type of Therapy: Individual Therapy  Treatment Goals addressed: Coping  Interventions: Strength-based  Summary: Carolyn Alvarez is a 30 y.o. female who presents to [facility] for a scheduled individual therapy session with this Probation officer. Clt reports she has been experiencing increased stress due to recently being suspended from her job. She states was told by her supervisor that she wasn't allowed to have blue hair. She further explains that she has not been able to pay her bills due to limited income. Clt states she has been working 3 different jobs and has not been able to manage her expenses. Clt reports having difficulty with setting boundaries with her mother. Clt became tearful as clt and this Probation officer discussed her transitioning to a Company secretary. Clt states "I don't want to change to a different counselor".  Suicidal/Homicidal: No  Therapist Response:  - Therapist acknowledged client's feelings. - Therapist instructed client to identify/list her current stressors. - Therapist and client discussed budgeting tools/techniques. - Therapist used a strengths-based approach to identify client's progress. - Therapist discussed boundary setting in healthy relationships.  Plan: Return again in 8 weeks. Clt reported having a preference of gender, age, and ethnicity for her assigned counselor. Clt has been scheduled with Paige C. for follow-up individual therapy.  Diagnosis: Axis I: Major Depression, Recurrent severe    Axis II: No diagnosis    [name], Counselor 08/14/2020

## 2020-08-22 NOTE — Progress Notes (Signed)
   THERAPIST PROGRESS NOTE  Session Time: 44mins  Participation Level: Active  Behavioral Response: Neat and Well GroomedAlertEuthymic  Type of Therapy: Individual Therapy  Treatment Goals addressed: Coping  Interventions: CBT and Strength-based  Summary: Carolyn Alvarez is a 30 y.o. female who presents to [facility] for a scheduled individual therapy session. Clt reports she has been feeling "down lately". She reports she is in the process of moving and she's trying to avoid feeling overwhelmed. Clt rated her depression a 7/10, with 10 being the worst. Clt reports she has been trying to spend more time with her daughter and work fulltime. Clt shared that she has not practiced much self-care lately due to working a lot.   Suicidal/Homicidal: No  Therapist Response:  - Therapist acknowledged client's feelings. - Therapist discussed self-care practices by offering different options client would be interested in doing. - Therapist practiced stress reduction techniques such as deep breathing.  Plan: Return again in 2 weeks.  Diagnosis: Axis I: Major Depression, Recurrent severe    Axis II: No diagnosis    [name], Counselor 07/17/2020

## 2020-09-30 ENCOUNTER — Ambulatory Visit: Payer: Medicaid Other | Admitting: Allergy

## 2020-09-30 NOTE — Progress Notes (Deleted)
Follow Up Note  RE: Carolyn Alvarez MRN: [id number] DOB: [birth year] Date of Office Visit: 09/30/2020  Referring provider: Lyndee Hensen, DO Primary care provider: Lyndee Hensen, DO  Chief Complaint: No chief complaint on file.  History of Present Illness: I had the pleasure of seeing [facility] for a follow up visit at the [facility] on 09/30/2020. She is a 30 y.o. female, who is being followed for asthma, allergic rhinitis, food allergy and contact dermatitis. Her previous allergy office visit was on 06/05/2020 with Dr. Maudie Mercury. Today is a regular follow up visit.  Mild persistent asthma without complication Well controlled with Flovent.  Normal spirometry today. Daily controller medication(s): continue Flovent 155mg 2 puffs twice a day with spacer and rinse mouth afterwards. May use albuterol rescue inhaler 2 puffs every 4 to 6 hours as needed for shortness of breath, chest tightness, coughing, and wheezing. May use albuterol rescue inhaler 2 puffs 5 to 15 minutes prior to strenuous physical activities. Monitor frequency of use.  Repeat spirometry at next visit.    Seasonal and perennial allergic rhinitis Past history - skin testing positive to trees, indoor molds and dust mites. Interim history - symptoms flare when forgets to take antihistamines. Continue environmental control measures. May use over the counter antihistamines such as Zyrtec (cetirizine), Claritin (loratadine), or Xyzal (levocetirizine) daily as needed. May take twice a day if needed.  [place]  May use Flonase (fluticasone) nasal spray 1 spray per nostril twice a day as needed for nasal congestion.  Nasal saline spray (i.e., Simply Saline) or nasal saline lavage (i.e., NeilMed) is recommended as needed and prior to medicated nasal sprays. Consider allergy injections for long term control if above medications do not help the symptoms - handout given.     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 Continue to avoid shellfish and citrus fruits. For mild symptoms you can take over the counter antihistamines such as Benadryl and monitor symptoms closely. If symptoms worsen or if you have severe symptoms including breathing issues, throat closure, significant swelling, whole body hives, severe diarrhea and vomiting, lightheadedness then inject epinephrine and seek immediate medical care afterwards.   Allergic contact dermatitis Past history - TRUE Test. Patches 3 - the top half was folded over and #25, 26, 31, 32 is not interpretable as it was not in contact with the skin during the 48 hour removal appointment. Patch testing: Borderline positive to epoxy resin, p-Phenylenediamine. Interim history - itching improved with antihistamines. Does wear wigs with dyes. Please avoid the above items - handouts given.  Safe list emailed again.   Return in about 4 months (around 10/05/2020).    Assessment and Plan: CKenlynnis a 30y.o. female with: No problem-specific Assessment & Plan notes found for this encounter.  No follow-ups on file.  No orders of the defined types were placed in this encounter.  Lab Orders  No laboratory test(s) ordered today    Diagnostics: Spirometry:  Tracings reviewed. Her effort: {Blank single:19197::"Good reproducible efforts.","It was hard to get consistent efforts and there is a question as to whether this reflects a maximal maneuver.","Poor effort, data can not be interpreted."} FVC: ***L FEV1: ***L, ***% predicted FEV1/FVC ratio: ***% Interpretation: {Blank single:19197::"Spirometry consistent with mild obstructive disease","Spirometry consistent with moderate obstructive disease","Spirometry consistent with severe obstructive disease","Spirometry consistent with possible restrictive disease","Spirometry consistent with mixed obstructive and restrictive disease","Spirometry uninterpretable due to  technique","Spirometry consistent with normal pattern","No  overt abnormalities noted given today's efforts"}.  Please see scanned spirometry results for details.  Skin Testing: {Blank single:19197::"Select foods","Environmental allergy panel","Environmental allergy panel and select foods","Food allergy panel","None","Deferred due to recent antihistamines use"}. *** Results discussed with patient/family.   Medication List:  Current Outpatient Medications  Medication Sig Dispense Refill   PROAIR HFA 108 (90 Base) MCG/ACT inhaler Inhale 2 puffs into the lungs every 4 (four) hours as needed for wheezing or shortness of breath. 3 each 1   albuterol (PROAIR HFA) 108 (90 Base) MCG/ACT inhaler Inhale 2 puffs into the lungs every 4 (four) hours as needed for wheezing or shortness of breath. DISPENSE brand or generic which ever is preferred 8 g 1   albuterol (VENTOLIN HFA) 108 (90 Base) MCG/ACT inhaler Inhale 1-2 puffs into the lungs every 4 (four) hours as needed for wheezing or shortness of breath (coughing fits). 18 g 1   azelastine (ASTELIN) 0.1 % nasal spray Use 1-2 sprays each nostril twice a day as needed for runny nose/drainage down throat 30 mL 5   EPINEPHrine 0.3 mg/0.3 mL IJ SOAJ injection Inject 0.3 mg into the muscle as needed for anaphylaxis. 1 each 1   fexofenadine (ALLEGRA) 60 MG tablet Take 60 mg by mouth 2 (two) times daily.     fluticasone (FLONASE) 50 MCG/ACT nasal spray Place 1 spray into both nostrils daily. 18.2 mL 5   fluticasone (FLOVENT HFA) 110 MCG/ACT inhaler Inhale 2 puffs into the lungs in the morning and at bedtime. 1 each 3   ibuprofen (ADVIL,MOTRIN) 800 MG tablet Take 1 tablet (800 mg total) by mouth every 8 (eight) hours as needed for moderate pain. 42 tablet 0   No current facility-administered medications for this visit.   Allergies: Allergies  Allergen Reactions   Citrus Itching and Rash   I reviewed her past medical history, social history, family history, and  environmental history and no significant changes have been reported from her previous visit.  Review of Systems  Constitutional:  Negative for appetite change, chills, fever and unexpected weight change.  HENT:  Negative for congestion and rhinorrhea.   Eyes:  Negative for itching.  Respiratory:  Negative for cough, chest tightness, shortness of breath and wheezing.   Gastrointestinal:  Negative for abdominal pain.  Skin:  Negative for rash.  Allergic/Immunologic: Positive for environmental allergies and food allergies.  Neurological:  Negative for headaches.   Objective: There were no vitals taken for this visit. There is no height or weight on file to calculate BMI. Physical Exam Vitals and nursing note reviewed.  Constitutional:      Appearance: Normal appearance. She is well-developed.  HENT:     Head: Normocephalic and atraumatic.     Right Ear: Tympanic membrane and external ear normal.     Left Ear: Tympanic membrane and external ear normal.     Nose: Nose normal.     Mouth/Throat:     Mouth: Mucous membranes are moist.     Pharynx: Oropharynx is clear.  Eyes:     Conjunctiva/sclera: Conjunctivae normal.  Cardiovascular:     Rate and Rhythm: Normal rate and regular rhythm.     Heart sounds: Normal heart sounds. No murmur heard. Pulmonary:     Effort: Pulmonary effort is normal.     Breath sounds: Normal breath sounds. No wheezing, rhonchi or rales.  Musculoskeletal:     Cervical back: Neck supple.  Skin:    General: Skin is warm.     Findings: No  rash.  Neurological:     Mental Status: She is alert and oriented to person, place, and time.  Psychiatric:        Behavior: Behavior normal.   Previous notes and tests were reviewed. The plan was reviewed with the patient/family, and all questions/concerned were addressed.  It was my pleasure to see Taige today and participate in her care. Please feel free to contact me with any questions or  concerns.  Sincerely,  Rexene Alberts, DO Allergy & Immunology  [facility] office: [place] office: [phone]

## 2020-10-23 ENCOUNTER — Ambulatory Visit (HOSPITAL_COMMUNITY): Payer: Medicaid Other | Admitting: Clinical

## 2020-12-09 ENCOUNTER — Ambulatory Visit: Payer: Medicaid Other | Admitting: Allergy

## 2020-12-09 NOTE — Progress Notes (Deleted)
Follow Up Note  RE: Carolyn Alvarez MRN: [id number] DOB: 08/08/1990 Date of Office Visit: 12/09/2020  Referring provider: Lyndee Hensen, DO Primary care provider: Lyndee Hensen, DO  Chief Complaint: No chief complaint on file.  History of Present Illness: I had the pleasure of seeing Carolyn Alvarez for a follow up visit at the [facility] [place] on 12/09/2020. She is a 30 y.o. female, who is being followed for asthma, allergic rhinitis, failure and atopic dermatitis. Her previous allergy office visit was on 06/05/2020 with Dr. Maudie Alvarez. Today is a regular follow up visit.  Mild persistent asthma without complication Well controlled with Flovent.  Normal spirometry today. Daily controller medication(s): continue Flovent 138mcg 2 puffs twice a day with spacer and rinse mouth afterwards. May use albuterol rescue inhaler 2 puffs every 4 to 6 hours as needed for shortness of breath, chest tightness, coughing, and wheezing. May use albuterol rescue inhaler 2 puffs 5 to 15 minutes prior to strenuous physical activities. Monitor frequency of use.  Repeat spirometry at next visit.    Seasonal and perennial allergic rhinitis Past history - skin testing positive to trees, indoor molds and dust mites. Interim history - symptoms flare when forgets to take antihistamines. Continue environmental control measures. May use over the counter antihistamines such as Zyrtec (cetirizine), Claritin (loratadine), or Xyzal (levocetirizine) daily as needed. May take twice a day if needed.  May want to change from [place].  May use Flonase (fluticasone) nasal spray 1 spray per nostril twice a day as needed for nasal congestion.  Nasal saline spray (i.e., Simply Saline) or nasal saline lavage (i.e., NeilMed) is recommended as needed and prior to medicated nasal sprays. Consider allergy injections for long term control if above medications do not help the symptoms - handout given.     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 Continue to avoid shellfish and citrus fruits. For mild symptoms you can take over the counter antihistamines such as Benadryl and monitor symptoms closely. If symptoms worsen or if you have severe symptoms including breathing issues, throat closure, significant swelling, whole body hives, severe diarrhea and vomiting, lightheadedness then inject epinephrine and seek immediate medical care afterwards.   Allergic contact dermatitis Past history - TRUE Test. Patches 3 - the top half was folded over and #25, 26, 31, 32 is not interpretable as it was not in contact with the skin during the 48 hour removal appointment. Patch testing: Borderline positive to epoxy resin, p-Phenylenediamine. Interim history - itching improved with antihistamines. Does wear wigs with dyes. Please avoid the above items - handouts given.  Safe list emailed again.   Return in about 4 months (around 10/05/2020).  Assessment and Plan: Carolyn Alvarez is a 30 y.o. female with: No problem-specific Assessment & Plan notes found for this encounter.  No follow-ups on file.  No orders of the defined types were placed in this encounter.  Lab Orders  No laboratory test(s) ordered today    Diagnostics: Spirometry:  Tracings reviewed. Her effort: {Blank single:19197::"Good reproducible efforts.","It was hard to get consistent efforts and there is a question as to whether this reflects a maximal maneuver.","Poor effort, data can not be interpreted."} FVC: ***L FEV1: ***L, ***% predicted FEV1/FVC ratio: ***% Interpretation: {Blank single:19197::"Spirometry consistent with mild obstructive disease","Spirometry consistent with moderate obstructive disease","Spirometry consistent with severe obstructive disease","Spirometry consistent with possible restrictive disease","Spirometry consistent with mixed obstructive and restrictive disease","Spirometry uninterpretable due to  technique","Spirometry consistent with normal pattern","No overt abnormalities noted  given today's efforts"}.  Please see scanned spirometry results for details.  Skin Testing: {Blank single:19197::"Select foods","Environmental allergy panel","Environmental allergy panel and select foods","Food allergy panel","None","Deferred due to recent antihistamines use"}. *** Results discussed with patient/family.   Medication List:  Current Outpatient Medications  Medication Sig Dispense Refill  . PROAIR HFA 108 (90 Base) MCG/ACT inhaler Inhale 2 puffs into the lungs every 4 (four) hours as needed for wheezing or shortness of breath. 3 each 1  . albuterol (PROAIR HFA) 108 (90 Base) MCG/ACT inhaler Inhale 2 puffs into the lungs every 4 (four) hours as needed for wheezing or shortness of breath. DISPENSE brand or generic which ever is preferred 8 g 1  . albuterol (VENTOLIN HFA) 108 (90 Base) MCG/ACT inhaler Inhale 1-2 puffs into the lungs every 4 (four) hours as needed for wheezing or shortness of breath (coughing fits). 18 g 1  . azelastine (ASTELIN) 0.1 % nasal spray Use 1-2 sprays each nostril twice a day as needed for runny nose/drainage down throat 30 mL 5  . EPINEPHrine 0.3 mg/0.3 mL IJ SOAJ injection Inject 0.3 mg into the muscle as needed for anaphylaxis. 1 each 1  . fexofenadine (ALLEGRA) 60 MG tablet Take 60 mg by mouth 2 (two) times daily.    . fluticasone (FLONASE) 50 MCG/ACT nasal spray Place 1 spray into both nostrils daily. 18.2 mL 5  . fluticasone (FLOVENT HFA) 110 MCG/ACT inhaler Inhale 2 puffs into the lungs in the morning and at bedtime. 1 each 3  . ibuprofen (ADVIL,MOTRIN) 800 MG tablet Take 1 tablet (800 mg total) by mouth every 8 (eight) hours as needed for moderate pain. 42 tablet 0   No current facility-administered medications for this visit.   Allergies: Allergies  Allergen Reactions  . Citrus Itching and Rash   I reviewed her past medical history, social history, family  history, and environmental history and no significant changes have been reported from her previous visit.  Review of Systems  Constitutional:  Negative for appetite change, chills, fever and unexpected weight change.  HENT:  Negative for congestion and rhinorrhea.   Eyes:  Negative for itching.  Respiratory:  Negative for cough, chest tightness, shortness of breath and wheezing.   Gastrointestinal:  Negative for abdominal pain.  Skin:  Negative for rash.  Allergic/Immunologic: Positive for environmental allergies and food allergies.  Neurological:  Negative for headaches.   Objective: There were no vitals taken for this visit. There is no height or weight on file to calculate BMI. Physical Exam Vitals and nursing note reviewed.  Constitutional:      Appearance: Normal appearance. She is well-developed.  HENT:     Head: Normocephalic and atraumatic.     Right Ear: Tympanic membrane and external ear normal.     Left Ear: Tympanic membrane and external ear normal.     Nose: Nose normal.     Mouth/Throat:     Mouth: Mucous membranes are moist.     Pharynx: Oropharynx is clear.  Eyes:     Conjunctiva/sclera: Conjunctivae normal.  Cardiovascular:     Rate and Rhythm: Normal rate and regular rhythm.     Heart sounds: Normal heart sounds. No murmur heard. Pulmonary:     Effort: Pulmonary effort is normal.     Breath sounds: Normal breath sounds. No wheezing, rhonchi or rales.  Musculoskeletal:     Cervical back: Neck supple.  Skin:    General: Skin is warm.     Findings: No rash.  Neurological:  Mental Status: She is alert and oriented to person, place, and time.  Psychiatric:        Behavior: Behavior normal.   Previous notes and tests were reviewed. The plan was reviewed with the patient/family, and all questions/concerned were addressed.  It was my pleasure to see Carolyn Alvarez today and participate in her care. Please feel free to contact me with any questions or  concerns.  Sincerely,  Rexene Alberts, DO Allergy & Immunology  [facility] office: [place] office: [phone]

## 2020-12-10 ENCOUNTER — Ambulatory Visit (HOSPITAL_COMMUNITY)
Admission: RE | Admit: 2020-12-10 | Discharge: 2020-12-10 | Disposition: A | Discharge status: Home / Self Care | Payer: Medicaid Other | Source: Ambulatory Visit | Attending: Family Medicine | Admitting: Family Medicine

## 2020-12-10 ENCOUNTER — Other Ambulatory Visit: Payer: Self-pay

## 2020-12-10 ENCOUNTER — Ambulatory Visit (INDEPENDENT_AMBULATORY_CARE_PROVIDER_SITE_OTHER): Payer: Medicaid Other | Admitting: Family Medicine

## 2020-12-10 ENCOUNTER — Telehealth: Payer: Self-pay | Admitting: Family Medicine

## 2020-12-10 ENCOUNTER — Encounter: Payer: Self-pay | Admitting: Family Medicine

## 2020-12-10 VITALS — BP 114/71 | HR 73 | Ht 61.0 in | Wt 230.0 lb

## 2020-12-10 DIAGNOSIS — M25572 Pain in left ankle and joints of left foot: Secondary | ICD-10-CM | POA: Diagnosis not present

## 2020-12-10 DIAGNOSIS — M79672 Pain in left foot: Secondary | ICD-10-CM | POA: Insufficient documentation

## 2020-12-10 DIAGNOSIS — G8929 Other chronic pain: Secondary | ICD-10-CM

## 2020-12-10 HISTORY — DX: Pain in left foot: M79.672

## 2020-12-10 MED ORDER — IBUPROFEN 600 MG PO TABS: 600.0000 mg | ORAL_TABLET | Freq: Three times a day (TID) | ORAL | 0 refills | Status: DC | PRN

## 2020-12-10 NOTE — Telephone Encounter (Signed)
Xray image was reviewed and discussed with the radiologist.  I called the patient and advised her that, per the radiologist, her left tarsal and metatarsal bones are intact.  I gave her the number to call [facility] for an appointment as they attempted to contact her previously. She verbalized understanding.

## 2020-12-10 NOTE — Assessment & Plan Note (Signed)
Currently she is non-suicidal. She will benefit from psychotherapy and she agreed to f/u with her PCP to discuss further. Brief counseling done regarding SI and indication for seeking help. She again reiterate that she is not suicidal and will not hurt anyone, it's just feelings she has once in a while.

## 2020-12-10 NOTE — Patient Instructions (Signed)
Ankle Sprain An ankle sprain is a stretch or tear in one of the tough tissues (ligaments) that connect the bones in your ankle. An ankle sprain can happen when the ankle rolls outward (inversion sprain) or inward (eversion sprain). What are the causes? This condition is caused by rolling or twisting the ankle. What increases the risk? You are more likely to develop this condition if you play sports. What are the signs or symptoms? Symptoms of this condition include: Pain in your ankle. Swelling. Bruising. This may happen right after you sprain your ankle or 1-2 days later. Trouble standing or walking. How is this diagnosed? This condition is diagnosed with: A physical exam. During the exam, your doctor will press on certain parts of your foot and ankle and try to move them in certain ways. X-ray imaging. These may be taken to see how bad the sprain is and to check for broken bones. How is this treated? This condition may be treated with: A brace or splint. This is used to keep the ankle from moving until it heals. An elastic bandage. This is used to support the ankle. Crutches. Pain medicine. Surgery. This may be needed if the sprain is very bad. Physical therapy. This may help to improve movement in the ankle. Follow these instructions at home: If you have a brace or a splint: Wear the brace or splint as told by your doctor. Remove it only as told by your doctor. Loosen the brace or splint if your toes: Tingle. Lose feeling (become numb). Turn cold and blue. Keep the brace or splint clean. If the brace or splint is not waterproof: Do not let it get wet. Cover it with a watertight covering when you take a bath or a shower. If you have an elastic bandage (dressing): Remove it to shower or bathe. Try not to move your ankle much, but wiggle your toes from time to time. This helps to prevent swelling. Adjust the dressing if it feels too tight. Loosen the dressing if your  foot: Loses feeling. Tingles. Becomes cold and blue. Managing pain, stiffness, and swelling  Take over-the-counter and prescription medicines only as told by doctor. For 2-3 days, keep your ankle raised (elevated) above the level of your heart. If told, put ice on the injured area: If you have a removable brace or splint, remove it as told by your doctor. Put ice in a plastic bag. Place a towel between your skin and the bag. Leave the ice on for 20 minutes, 2-3 times a day. General instructions Rest your ankle. Do not use your injured leg to support your body weight until your doctor says that you can. Use crutches as told by your doctor. Do not use any products that contain nicotine or tobacco, such as cigarettes, e-cigarettes, and chewing tobacco. If you need help quitting, ask your doctor. Keep all follow-up visits as told by your doctor. Contact a doctor if: Your bruises or swelling are quickly getting worse. Your pain does not get better after you take medicine. Get help right away if: You cannot feel your toes or foot. Your foot or toes look blue. You have very bad pain that gets worse. Summary An ankle sprain is a stretch or tear in one of the tough tissues (ligaments) that connect the bones in your ankle. This condition is caused by rolling or twisting the ankle. Symptoms include pain, swelling, bruising, and trouble walking. To help with pain and swelling, put ice on the injured ankle,  raise your ankle above the level of your heart, and use an elastic bandage. Also, rest as told by your doctor. Keep all follow-up visits as told by your doctor. This is important. This information is not intended to replace advice given to you by your health care provider. Make sure you discuss any questions you have with your health care provider. Document Revised: 03/21/2020 Document Reviewed: 03/21/2020 Elsevier Patient Education  2022 [employer].
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
## 2020-12-10 NOTE — Assessment & Plan Note (Signed)
I reviewed her ED record from 2016 as well as the MRI of her left foot from 2017 Pain might be secondary to her previous injury/fracture. Ibuprofen 600 mg prescribed prn pain. I discussed use of OTC foot brace to limit weightbearing. Xray of her foot ordered and I referred to her to foot specialist since this has been ongoing for 6 years. All questions were answered.  MRI Left foot IMPRESSION: Acute or subacute nondisplaced appearing fracture head of the fifth metatarsal.  Acute or subacute nondisplaced appearing fracture through the base of the fourth metatarsal extends to the articular surface.  The tarsometatarsal joints are normally aligned but the dorsal band of the Lisfranc ligament appears torn. The intraosseous band is intact.

## 2020-12-23 ENCOUNTER — Ambulatory Visit: Payer: Medicaid Other | Admitting: Podiatry

## 2020-12-30 NOTE — Progress Notes (Deleted)
    SUBJECTIVE:   CHIEF COMPLAINT / HPI: congestion & HA  ***  PERTINENT  PMH / PSH: ***  OBJECTIVE:   LMP 12/08/2020   ***  ASSESSMENT/PLAN:   No problem-specific Assessment & Plan notes found for this encounter.     Carolyn Foster, MD [place]

## 2020-12-31 ENCOUNTER — Ambulatory Visit (INDEPENDENT_AMBULATORY_CARE_PROVIDER_SITE_OTHER): Payer: Medicaid Other | Admitting: Family Medicine

## 2020-12-31 ENCOUNTER — Other Ambulatory Visit: Payer: Self-pay

## 2020-12-31 VITALS — BP 127/81 | HR 91

## 2020-12-31 DIAGNOSIS — R052 Subacute cough: Secondary | ICD-10-CM | POA: Diagnosis not present

## 2020-12-31 DIAGNOSIS — G4482 Headache associated with sexual activity: Secondary | ICD-10-CM

## 2020-12-31 MED ORDER — BENZONATATE 100 MG PO CAPS
100.0000 mg | ORAL_CAPSULE | Freq: Two times a day (BID) | ORAL | 0 refills | Status: DC | PRN
Start: 2020-12-31 — End: 2021-10-08

## 2020-12-31 MED ORDER — NAPROXEN 500 MG PO TABS: 500.0000 mg | ORAL_TABLET | Freq: Two times a day (BID) | ORAL | 0 refills | Status: DC

## 2020-12-31 MED ORDER — PROCHLORPERAZINE MALEATE 5 MG PO TABS: 5.0000 mg | ORAL_TABLET | Freq: Four times a day (QID) | ORAL | 0 refills | Status: DC | PRN

## 2020-12-31 NOTE — Progress Notes (Signed)
    SUBJECTIVE:   CHIEF COMPLAINT / HPI: congestion & HA  Patient reports she was sick on Nov 8th and reports drinking at a party because she was celebrating her birthday and felt improved  four days later. She reports that while trying to have sex, she reports that while lying down for oral sex she started to have a severe HA after feeling better. She reports that she had a lingering cough at this time. She tried to have penetration sex again and then had a HA while lying down. For the third time they tired sex again with her lying down and the HA returned. 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  She localized the HA to the occipital region as the most severe bilaterally. She has continued to work and stay calm. She reports some tension and pain in the nape of her neck. After a shower and tylenol, the pain subsided enough for her to sleep. She denies a HA while trying to sleep. She does not lean over to pick much. She stopped taking cold medications on the Sunday after she felt improved. She tries to limit her medications. She reports that allegra has helped with the cough and the nasal spray as well as her inhalers have helped the coughng and breathing symptoms.   PERTINENT  PMH / PSH:  Asthma  Allergic rhinitis    OBJECTIVE:   BP 127/81   Pulse 91   LMP 12/08/2020   SpO2 100%   Physical Exam Cardiovascular:     Rate and Rhythm: Normal rate and regular rhythm.     Pulses: Normal pulses.     Heart sounds: Normal heart sounds. No murmur heard. Pulmonary:     Effort: Pulmonary effort is normal. No respiratory distress.     Breath sounds: Normal breath sounds. No wheezing, rhonchi or rales.  Neurological:     General: No focal deficit present.     Mental Status: She is alert and oriented to person, place, and time.     GCS: GCS eye subscore is 4. GCS verbal subscore is 5. GCS motor subscore is 6.     Cranial Nerves: No cranial  nerve deficit, dysarthria or facial asymmetry.     Sensory: Sensation is intact. No sensory deficit.     Motor: Motor function is intact. No weakness, tremor or abnormal muscle tone.     Coordination: Coordination is intact. Coordination normal.     Gait: Gait normal.     Comments: 5/5 strength in bilateral upper and lower extremities     ASSESSMENT/PLAN:   Cough Rx tessalon perles    Coital headache Concern for positional headache associated with intercourse  Will treat symptoms with naproxen and compazine  Will submit referral to neurology for evaluation along with head CT      Eulis Foster, MD [place]

## 2020-12-31 NOTE — Patient Instructions (Signed)
Due to the unusual nature of your headache, I am recommending an evaluation by neurology as well as a head CT scan.   Our office will call to schedule this with you in the next few days pending insurance approval.   In the meantime, please use the naproxen and compazine combination for any onset of headache to see if that improves your pain.    The neurology office will be in touch with you in the next 2 weeks to schedule an appointment so please be on the lookout for a call from their office .

## 2021-01-01 ENCOUNTER — Ambulatory Visit (INDEPENDENT_AMBULATORY_CARE_PROVIDER_SITE_OTHER): Payer: Medicaid Other

## 2021-01-01 ENCOUNTER — Ambulatory Visit: Payer: Medicaid Other | Admitting: Podiatry

## 2021-01-01 DIAGNOSIS — S9032XA Contusion of left foot, initial encounter: Secondary | ICD-10-CM | POA: Diagnosis not present

## 2021-01-01 DIAGNOSIS — M7752 Other enthesopathy of left foot: Secondary | ICD-10-CM

## 2021-01-01 MED ORDER — METHYLPREDNISOLONE 4 MG PO TBPK: ORAL_TABLET | ORAL | 0 refills | Status: DC

## 2021-01-01 MED ORDER — BETAMETHASONE SOD PHOS & ACET 6 (3-3) MG/ML IJ SUSP
3.0000 mg | Freq: Once | INTRAMUSCULAR | Status: AC
  Administered 2021-01-01: 3 mg via INTRA_ARTICULAR

## 2021-01-01 MED ORDER — MELOXICAM 15 MG PO TABS: 15.0000 mg | ORAL_TABLET | Freq: Every day | ORAL | 1 refills | Status: DC
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
## 2021-01-05 DIAGNOSIS — G4482 Headache associated with sexual activity: Secondary | ICD-10-CM | POA: Insufficient documentation

## 2021-01-05 DIAGNOSIS — R059 Cough, unspecified: Secondary | ICD-10-CM | POA: Insufficient documentation

## 2021-01-05 NOTE — Assessment & Plan Note (Signed)
Concern for positional headache associated with intercourse  Will treat symptoms with naproxen and compazine  Will submit referral to neurology for evaluation along with head CT

## 2021-01-05 NOTE — Assessment & Plan Note (Signed)
Rx tessalon perles

## 2021-01-07 ENCOUNTER — Encounter: Payer: Self-pay | Admitting: Neurology

## 2021-01-07 ENCOUNTER — Ambulatory Visit: Payer: Medicaid Other | Admitting: Family Medicine

## 2021-01-08 ENCOUNTER — Telehealth: Payer: Self-pay

## 2021-01-08 NOTE — Telephone Encounter (Signed)
Called and LVM for patient's upcoming appointment.  CT Head 01/15/2021 1630 with arrival at [facility] at [place]  .Ozella Almond, CMA

## 2021-01-15 ENCOUNTER — Ambulatory Visit (HOSPITAL_BASED_OUTPATIENT_CLINIC_OR_DEPARTMENT_OTHER): Admission: RE | Admit: 2021-01-15 | Payer: Medicaid Other | Source: Ambulatory Visit

## 2021-01-16 ENCOUNTER — Ambulatory Visit: Payer: Medicaid Other | Admitting: Family Medicine

## 2021-01-16 ENCOUNTER — Other Ambulatory Visit: Payer: Self-pay

## 2021-01-29 ENCOUNTER — Ambulatory Visit: Payer: Medicaid Other | Admitting: Podiatry

## 2021-01-30 ENCOUNTER — Ambulatory Visit: Payer: Medicaid Other | Admitting: Family Medicine

## 2021-02-24 ENCOUNTER — Ambulatory Visit (INDEPENDENT_AMBULATORY_CARE_PROVIDER_SITE_OTHER): Payer: Medicaid Other | Admitting: Podiatry

## 2021-02-24 DIAGNOSIS — M7752 Other enthesopathy of left foot: Secondary | ICD-10-CM | POA: Diagnosis not present

## 2021-02-24 MED ORDER — BETAMETHASONE SOD PHOS & ACET 6 (3-3) MG/ML IJ SUSP: 3.0000 mg | Freq: Once | INTRAMUSCULAR | Status: DC

## 2021-02-24 MED ORDER — MELOXICAM 15 MG PO TABS: 15.0000 mg | ORAL_TABLET | Freq: Every day | ORAL | 1 refills | Status: DC
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
## 2021-03-11 NOTE — Progress Notes (Deleted)
NEUROLOGY CONSULTATION NOTE  [name] MRN: [id number] DOB: 01/26/1991  Referring provider: Sherren Mocha McDiarmid, MD Primary care provider: Lyndee Hensen, DO  Reason for consult:  coital headache  Assessment/Plan:   Probable primary headache associated with sexual activity  Must rule out secondary etiologies - CTA head and neck Take indomethacin 50mg  one to two hours prior to coitus.  May repeat if headache still occurs. ***   Subjective:  Carolyn Alvarez is a 31 year old female with asthma who presents for coital headache.  History supplemented by primary care note.  ***.  CT of head was ordered but patient did not show for appointment.       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 3 each 1   albuterol (PROAIR HFA) 108 (90 Base) MCG/ACT inhaler Inhale 2 puffs into the lungs every 4 (four) hours as needed for wheezing or shortness of breath. DISPENSE brand or generic which ever is preferred 8 g 1   albuterol (VENTOLIN HFA) 108 (90 Base) MCG/ACT inhaler Inhale 1-2 puffs into the lungs every 4 (four) hours as needed for wheezing or shortness of breath (coughing fits). 18 g 1   azelastine (ASTELIN) 0.1 % nasal spray Use 1-2 sprays each nostril twice a day as needed for runny nose/drainage down throat 30 mL 5   benzonatate (TESSALON) 100 MG capsule Take 1 capsule (100 mg total) by mouth 2 (two) times daily as needed for cough. 20 capsule 0   EPINEPHrine 0.3 mg/0.3 mL IJ  SOAJ injection Inject 0.3 mg into the muscle as needed for anaphylaxis. 1 each 1   fexofenadine (ALLEGRA) 60 MG tablet Take 60 mg by mouth 2 (two) times daily.     fluticasone (FLONASE) 50 MCG/ACT nasal spray Place 1 spray into both nostrils daily. 18.2 mL 5   fluticasone (FLOVENT HFA) 110 MCG/ACT inhaler Inhale 2 puffs into the lungs in the morning and at bedtime. 1 each 3   ibuprofen (ADVIL) 600 MG tablet Take 1 tablet (600 mg total) by mouth every 8 (eight) hours as needed for moderate pain. 60 tablet 0   meloxicam (MOBIC) 15 MG tablet Take 1 tablet (15 mg total) by mouth daily. 30 tablet 1   methylPREDNISolone (MEDROL DOSEPAK) 4 MG TBPK tablet 6 day dose pack - take as directed 21 tablet 0   naproxen (NAPROSYN) 500 MG tablet Take 1 tablet (500 mg total) by mouth 2 (two) times daily with a meal. 30 tablet 0   prochlorperazine (COMPAZINE) 5 MG tablet Take 1 tablet (5 mg total) by mouth every 6 (six) hours as needed for nausea or vomiting. 10 tablet 0   Current Facility-Administered Medications on File Prior to Visit  Medication Dose Route Frequency Provider Last Rate Last Admin   betamethasone acetate-betamethasone sodium phosphate (CELESTONE) injection 3 mg  3 mg Intra-articular Once Daylene Katayama M, DPM       betamethasone acetate-betamethasone sodium phosphate (CELESTONE) injection  3 mg  3 mg Intra-articular Once Edrick Kins, DPM        ALLERGIES: Allergies  Allergen Reactions   Citrus Itching and Rash    FAMILY HISTORY: Family History  Problem Relation Age of Onset   Asthma Mother     Objective:  *** General: No acute distress.  Patient appears well-groomed.   Head:  Normocephalic/atraumatic Eyes:  fundi examined but not visualized Neck: supple, no paraspinal tenderness, full range of motion Back: No paraspinal tenderness Heart: regular rate and rhythm Lungs: Clear to auscultation bilaterally. Vascular: No carotid bruits. Neurological Exam: Mental status: alert and  oriented to person, place, and time, recent and remote memory intact, fund of knowledge intact, attention and concentration intact, speech fluent and not dysarthric, language intact. Cranial nerves: CN I: not tested CN II: pupils equal, round and reactive to light, visual fields intact CN III, IV, VI:  full range of motion, no nystagmus, no ptosis CN V: facial sensation intact. CN VII: upper and lower face symmetric CN VIII: hearing intact CN IX, X: gag intact, uvula midline CN XI: sternocleidomastoid and trapezius muscles intact CN XII: tongue midline Bulk & Tone: normal, no fasciculations. Motor:  muscle strength 5/5 throughout Sensation:  Pinprick, temperature and vibratory sensation intact. Deep Tendon Reflexes:  2+ throughout,  toes downgoing.   Finger to nose testing:  Without dysmetria.   Heel to shin:  Without dysmetria.   Gait:  Normal station and stride.  Romberg negative.    Thank you for allowing me to take part in the care of this patient.  Metta Clines, DO  CC: ***

## 2021-03-12 ENCOUNTER — Ambulatory Visit: Payer: Medicaid Other | Admitting: Neurology

## 2021-05-28 ENCOUNTER — Encounter: Payer: Self-pay | Admitting: *Deleted

## 2021-05-30 ENCOUNTER — Encounter: Payer: Self-pay | Admitting: Family Medicine

## 2021-05-30 ENCOUNTER — Ambulatory Visit: Payer: Medicaid Other | Admitting: Family Medicine

## 2021-05-30 ENCOUNTER — Other Ambulatory Visit: Payer: Self-pay | Admitting: Family Medicine

## 2021-05-30 VITALS — BP 121/76 | HR 83 | Wt 225.0 lb

## 2021-05-30 DIAGNOSIS — F339 Major depressive disorder, recurrent, unspecified: Secondary | ICD-10-CM

## 2021-05-30 DIAGNOSIS — Z3200 Encounter for pregnancy test, result unknown: Secondary | ICD-10-CM | POA: Diagnosis present

## 2021-05-30 LAB — POCT URINE PREGNANCY: Preg Test, Ur: NEGATIVE

## 2021-05-30 MED ORDER — CITALOPRAM HYDROBROMIDE 10 MG PO TABS: ORAL_TABLET | ORAL | 0 refills | Status: DC

## 2021-05-30 NOTE — Assessment & Plan Note (Addendum)
PHQ-9 score of 26 with a score of 2 for question #9.  Denies SI.  No previous attempts.  Notes she would never hurt herself or anyone else.  Says she just thinks about death.  She has had increased work life stressors.  She likes her new job but is stressed about money.  She cannot afford a private therapist.  Therapy resources provided.  See AVS.  She would benefit greatly from psychotherapy as she has had a positive response in the past. ? ?On chart review, she has had good response with Celexa in the past.  Restart Celexa 10 mg and titrate upward.  Follow-up in office in 4 weeks as scheduled.  ?

## 2021-05-30 NOTE — Progress Notes (Signed)
? ?  SUBJECTIVE:  ? ?CHIEF COMPLAINT / HPI:  ? ?Chief Complaint  ?Patient presents with  ? Depression  ? ? ? ?Carolyn Alvarez is a 31 y.o. female here for to discuss depression.  Patient reports at the end of February she was fired for different combination but regained her job and subsequently fired again.  She has no fight left to fight again about the situation because this is affecting her mental health.  Notes that in January she had an abortion. Patient's last menstrual period was 04/25/2021 (approximate).  She is worried because she has missed a period.  In the midst of everything else her car broke down as well.  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  Notes that the business is slow and this is also causing stress in her life.  She was doing well and going to therapy nearly every week.  However, her therapist moved to another area and then her mentor moved.  States that this " took a toll on me."  She feels like she does not have anyone to talk to.  She also thinks about dying and would be fine if she died.  She denies suicidal ideations.  States that she will never harm herself.  She would like to start medication again. ? ? ? ?PERTINENT  PMH / PSH: reviewed and updated as appropriate  ? ?OBJECTIVE:  ? ?BP 121/76   Pulse 83   Wt 225 lb (102.1 kg)   LMP 04/25/2021 (Approximate) Comment: abortion in january 2023, hasn't been regular since  BMI 42.51 kg/m?   ? ? ?GEN: well appearing female in no acute distress  ?CVS: well perfused  ?RESP: speaking in full sentences without pause, no respiratory distress  ?PSYCH: Neatly groomed and appropriately dressed. Maintains good eye contact and is cooperative and attentive. Speech is normal volume and rate. Denies SI/ HI. Normal affect. ? ? ?ASSESSMENT/PLAN:  ? ?Depression, recurrent ([place]) ?PHQ-9 score of 26 with a score of 2 for question #9.  Denies SI.  No previous attempts.  Notes she would never hurt herself or anyone else.  Says she just  thinks about death.  She has had increased work life stressors.  She likes her new job but is stressed about money.  She cannot afford a private therapist.  Therapy resources provided.  See AVS.  She would benefit greatly from psychotherapy as she has had a positive response in the past. ? ?On chart review, she has had good response with Celexa in the past.  Restart Celexa 10 mg and titrate upward.  Follow-up in office in 4 weeks as scheduled.  ?  ?Missed Period  ?Pregnancy test negative ? ? ? ?Lyndee Hensen, DO ?PGY-3, [facility] ?05/30/2021  ? ? ? ? ? ? ? ? ?

## 2021-05-30 NOTE — Progress Notes (Deleted)
? ?  SUBJECTIVE:  ? ?CHIEF COMPLAINT / HPI:  ? ?Chief Complaint  ?Patient presents with  ? Depression  ? ? ? ?Carolyn Alvarez is a 31 y.o. female here for ***  ? ?Pt reports ***  ?\Patient's last menstrual period was 04/25/2021 (approximate). ? ? ? ?PERTINENT  PMH / PSH: reviewed and updated as appropriate  ? ?OBJECTIVE:  ? ?BP 121/76   Pulse 83   Wt 225 lb (102.1 kg)   LMP 04/25/2021 (Approximate) Comment: abortion in january 2023, hasn't been regular since  BMI 42.51 kg/m?   ?*** ? ?ASSESSMENT/PLAN:  ? ?No problem-specific Assessment & Plan notes found for this encounter. ?  ? ? ?Lyndee Hensen, DO ?PGY-3, [facility] ?05/30/2021  ? ? ? ? ?{    This will disappear when note is signed, click to select method of visit    :1} ? ? ? ?

## 2021-05-30 NOTE — Patient Instructions (Addendum)
Here are some therapy resources to contact: ? ?[facility]iatry and Counseling ?[address] [place], [place] [address] ?[phone] ? ?[facility]:   ?[facility]: [address]     [phone]   ?[place]: [address],    [phone] ? ?[facility] (virtual only) ?[phone] ? ?[facility]  (Psychiatry only; Adults /children 12 and over, will take Medicaid)  ?[address], [place], [place] [address]       [phone] ? ?[facility] (Psychiatry & counseling ; adults & children ; will take Medicaid ?[address]  [place] [address]  ?Go on-line to complete referral ( [email or web address] ?[phone]    ? ?[facility]  Psychiatry & counseling; Adults and children;  ?Call Registration prior to scheduling an appointment [phone] ?[address]    [place], [place] [address]    [phone] ? ?[facility] (Psychiatry & counseling; adults & children; Medicare no Medicaid)  ?[address], [place]  [address]      [phone]   ? ?[facility] (up to age 9)  ?Psychiatry & counseling ,will take Medicaid, must do counseling to receive psychiatry services  ?[place] [address]        [phone] ? ?It was great meeting you today! ? ?Please check-out at the front desk before leaving the clinic. I'd like to see you back in 4 weeks but if you need to be seen earlier than that for any new issues we're happy to fit you in, just give [place] a call! ? ?[facility]: ?- Stop by the pharmacy to pick up your prescriptions  ?- Medicine Changes: Start  Celexa 10 mg daily then increase to 20 mg daily.  ? ?Taking the medicine as directed and not missing any doses is one of the best things you can do to treat your depression.  Here are some things to keep in mind: ? ?Side effects (stomach upset, some  increased anxiety) may happen before you notice a benefit.  These side effects typically go away over time. ?Changes to your dose of medicine or a change in medication all together is sometimes necessary ?Most people need to be on medication at least 6-12 months ?Many people will notice an improvement within two weeks but the full effect of the medication can take up to 4-6 weeks ?Stopping the medication when you start feeling better often results in a return of symptoms ?If you start having thoughts of hurting yourself or others after starting this medicine, please call me at [phone] immediately.  ? ? ?Feel free to call with any questions or concerns at any time, at [phone]. ?  ? ?If you are feeling suicidal or depression symptoms worsen please immediately go to:  ? ?24 Hour Availability ?[facility]  ?[place], [place] ?[place] [phone] ?Crisis [phone] ? ? ?[place] National Suicide Hotline   ?[phone] Diamantina Monks) ? ? ? ?Take care,  ?Dr. Susa Simmonds ?[place]  ?

## 2021-06-30 ENCOUNTER — Ambulatory Visit: Payer: Medicaid Other | Admitting: Family Medicine

## 2021-07-15 ENCOUNTER — Encounter: Payer: Self-pay | Admitting: *Deleted
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
## 2021-10-08 ENCOUNTER — Other Ambulatory Visit (HOSPITAL_COMMUNITY)
Admission: RE | Admit: 2021-10-08 | Discharge: 2021-10-08 | Disposition: A | Discharge status: Home / Self Care | Payer: Medicaid Other | Source: Ambulatory Visit | Attending: Family Medicine | Admitting: Family Medicine

## 2021-10-08 ENCOUNTER — Encounter: Payer: Self-pay | Admitting: Student

## 2021-10-08 ENCOUNTER — Ambulatory Visit (INDEPENDENT_AMBULATORY_CARE_PROVIDER_SITE_OTHER): Payer: Medicaid Other | Admitting: Student

## 2021-10-08 VITALS — BP 112/82 | HR 78 | Ht 61.5 in | Wt 224.0 lb

## 2021-10-08 DIAGNOSIS — Z113 Encounter for screening for infections with a predominantly sexual mode of transmission: Secondary | ICD-10-CM

## 2021-10-08 DIAGNOSIS — T7800XD Anaphylactic reaction due to unspecified food, subsequent encounter: Secondary | ICD-10-CM

## 2021-10-08 DIAGNOSIS — B379 Candidiasis, unspecified: Secondary | ICD-10-CM | POA: Diagnosis not present

## 2021-10-08 DIAGNOSIS — J4530 Mild persistent asthma, uncomplicated: Secondary | ICD-10-CM

## 2021-10-08 DIAGNOSIS — Z131 Encounter for screening for diabetes mellitus: Secondary | ICD-10-CM

## 2021-10-08 DIAGNOSIS — B9689 Other specified bacterial agents as the cause of diseases classified elsewhere: Secondary | ICD-10-CM | POA: Diagnosis not present

## 2021-10-08 DIAGNOSIS — Z0001 Encounter for general adult medical examination with abnormal findings: Secondary | ICD-10-CM

## 2021-10-08 DIAGNOSIS — Z124 Encounter for screening for malignant neoplasm of cervix: Secondary | ICD-10-CM | POA: Diagnosis present

## 2021-10-08 DIAGNOSIS — N760 Acute vaginitis: Secondary | ICD-10-CM | POA: Diagnosis not present

## 2021-10-08 DIAGNOSIS — N898 Other specified noninflammatory disorders of vagina: Secondary | ICD-10-CM | POA: Insufficient documentation

## 2021-10-08 DIAGNOSIS — A599 Trichomoniasis, unspecified: Secondary | ICD-10-CM

## 2021-10-08 DIAGNOSIS — F339 Major depressive disorder, recurrent, unspecified: Secondary | ICD-10-CM

## 2021-10-08 LAB — POCT WET PREP (WET MOUNT): Clue Cells Wet Prep Whiff POC: POSITIVE

## 2021-10-08 LAB — POCT GLYCOSYLATED HEMOGLOBIN (HGB A1C): Hemoglobin A1C: 5.4 % (ref 4.0–5.6)

## 2021-10-08 MED ORDER — EPINEPHRINE 0.3 MG/0.3ML IJ SOAJ: 0.3000 mg | INTRAMUSCULAR | 1 refills | Status: DC | PRN

## 2021-10-08 MED ORDER — FLUTICASONE PROPIONATE HFA 110 MCG/ACT IN AERO: 2.0000 | INHALATION_SPRAY | Freq: Two times a day (BID) | RESPIRATORY_TRACT | 3 refills | Status: DC

## 2021-10-08 MED ORDER — METRONIDAZOLE 500 MG PO TABS: 500.0000 mg | ORAL_TABLET | Freq: Two times a day (BID) | ORAL | 0 refills | Status: AC

## 2021-10-08 MED ORDER — ALBUTEROL SULFATE HFA 108 (90 BASE) MCG/ACT IN AERS: 2.0000 | INHALATION_SPRAY | RESPIRATORY_TRACT | 1 refills | Status: DC | PRN

## 2021-10-08 MED ORDER — FLUCONAZOLE 150 MG PO TABS: 150.0000 mg | ORAL_TABLET | Freq: Once | ORAL | 0 refills | Status: AC

## 2021-10-08 NOTE — Assessment & Plan Note (Addendum)
Wet prep positive for yeast, BV and trichomonas.  Patient called about results and confirmed date of birth on phone with her-discussed that partner will need to be treated for trichomonas as well and that it is an STD. -Metronidazole 500 mg twice daily for 7 days -Fluconazole 150 mg tablet -Follow-up STD testing

## 2021-10-08 NOTE — Patient Instructions (Addendum)
It was great to see you! Thank you for allowing me to participate in your care!   I recommend that you always bring your medications to each appointment as this makes it easy to ensure we are on the correct medications and helps [place] not miss when refills are needed.  Our plans for today:  Today at your annual preventive visit we talked about the following measures:   I recommend 150 minutes of exercise per week-try 30 minutes 5 days per week We discussed reducing sugary beverages (like soda and juice) and increasing leafy greens and whole fruits.  We discussed avoiding tobacco and alcohol.  I recommend avoiding illicit substances.  Your blood pressure is at goal.    We are checking some labs today, I will call you if they are abnormal will send you a MyChart message or a letter if they are normal.  If you do not hear about your labs in the next 2 weeks please let us know.  Take care and seek immediate care sooner if you develop any concerns. Please remember to show up 15 minutes before your scheduled appointment time!  Gerrit Heck, MD [facility]    Therapy and Counseling Resources Most providers on this list will take Medicaid. Patients with commercial insurance or Medicare should contact their insurance company to get a list of in network providers.  [employer] [employer] (takes children) Location 1: [address] [place], [place] [address] Location 2: [place], [place] [address] [place] ([place] speaking therapist available)(habla espanol)(take medicare and medicaid)  [place], [place], [place] [address], [place] [email or web address] [phone]  BestDay:Psychiatry and Counseling [address] [place], [place] [address] [place], [place], [place] [address]      [phone]  [place] (spanish available) [place], [place] [address] [place] (take [facility] and medicare) [address], [place], [place] [address]       [phone]     [place] (virtual only) [phone]  [facility] [address] [place], [place]  Family Solutions:  [place]. [place] [phone]  Journeys Counseling:  [place] [address] F[address] [phone]  [facility] (under & uninsured) [address] [place] [phone]    [email or web address]    [place] [address]  [place]    [phone]  [facility]     Phone:  [phone]     [place]  [place] [address]      [place], [place] ext [place]: [place], [place], [place]   [place] ([place] therapist) [email or web address]  [place] [address]   [place] [address]    [phone]    [facility]   [address],  [place], [place]   [facility] [place]  [place]  [facility]  [address] [place], [place]        [phone]  Open Access/Walk In Clinic under & uninsured  [facility]  [address]  [place], [place]  Family Service of the [place],  ([place])   [place], [place]: [phone]) 8:30 - 12; 1 - 2:30  Family Service of the [place],  [place], [place]    [phone]):8:30 - 12; 2 - 3PM  [facility] [employer],  [address]  [place]; [phone]):   Mon - Fri 8 AM - 5 PM  Alcohol [facility] Drug Services [place]  MWF 12:30 to  3:00 or call to schedule an appointment  [phone]  Specific Provider options Psychology Today  [email or web address] click on find a therapist  enter your zip code left side and select or tailor a therapist for your specific need.   [facility] Provider Directory [email or web address]  (Medicaid)   Follow all drop down to find a provider  [place] or [email or web address] [address], [place], [place] Recovery support and educational   24- Hour Availability:   [facility]  [address] [place], [place] Crisis [phone]  Family Service of the [employer] [phone]  [place]  [phone]   [place]  [phone] (after hours)  Therapeutic Alternative/Mobile Crisis   [phone]  [place] National Suicide Hotline  [phone] Diamantina Monks)  Call 911 or go to emergency room  [facility]  [phone]);  [place] and [employer]  [phone]); [place], [place], [place], [place], [place], [place], [place]

## 2021-10-08 NOTE — Assessment & Plan Note (Signed)
Not taking controller medication -Counseled on taking controller Flovent twice daily refilled -Albuterol refilled

## 2021-10-08 NOTE — Assessment & Plan Note (Signed)
No active SI today.  Discontinued Celexa from patient's medication list as patient had active SI at that time.  She has had positive bipolar screenings in the past -Follow-up in 2 weeks for full bipolar screening -Therapy resources provided on AVS

## 2021-10-08 NOTE — Assessment & Plan Note (Signed)
Refilled epipen ?

## 2021-10-09 LAB — HCV INTERPRETATION

## 2021-10-09 LAB — RPR: RPR Ser Ql: NONREACTIVE

## 2021-10-09 LAB — HCV AB W REFLEX TO QUANT PCR: HCV Ab: NONREACTIVE

## 2021-10-09 LAB — HIV ANTIBODY (ROUTINE TESTING W REFLEX): HIV Screen 4th Generation wRfx: NONREACTIVE

## 2021-10-14 LAB — CYTOLOGY - PAP
Chlamydia: NEGATIVE
Comment: NEGATIVE
Comment: NEGATIVE
Comment: NORMAL
Diagnosis: NEGATIVE
High risk HPV: NEGATIVE
Neisseria Gonorrhea: NEGATIVE

## 2021-11-07 ENCOUNTER — Ambulatory Visit: Payer: Self-pay | Admitting: Student

## 2021-11-07 NOTE — Progress Notes (Deleted)
    SUBJECTIVE:   CHIEF COMPLAINT / HPI:   Depression Bipolar screen today was      10/08/2021   10:28 AM 05/30/2021    2:43 PM 12/10/2020   11:03 AM 03/29/2020    3:24 PM 01/25/2020    9:56 AM  Depression screen PHQ 2/9  Decreased Interest '1 3 2    '$ Down, Depressed, Hopeless '1 3 2    '$ PHQ - 2 Score '2 6 4    '$ Altered sleeping '1 3 2    '$ Tired, decreased energy '1 3 2    '$ Change in appetite '1 3 2    '$ Feeling bad or failure about yourself  '1 3 3    '$ Trouble concentrating '1 3 3    '$ Moving slowly or fidgety/restless '2 3 2    '$ Suicidal thoughts '1 2 1    '$ PHQ-9 Score '10 26 19    '$ Difficult doing work/chores  Extremely dIfficult Very difficult       Information is confidential and restricted. Go to Review Flowsheets to unlock data.     PERTINENT  PMH / PSH: ***  OBJECTIVE:   LMP 09/27/2021   ***  ASSESSMENT/PLAN:   No problem-specific Assessment & Plan notes found for this encounter.     Gerrit Heck, MD [place]

## 2021-11-13 ENCOUNTER — Ambulatory Visit: Payer: Medicaid Other | Admitting: Student

## 2021-11-13 NOTE — Progress Notes (Deleted)
  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  General: NAD, pleasant, able to participate in exam Cardiac: RRR, no murmurs auscultated Respiratory: CTAB, normal WOB Abdomen: soft, non-tender, non-distended, normoactive bowel sounds Extremities: warm and well perfused, no edema or cyanosis Skin: warm and dry, no rashes noted Neuro: alert, no obvious focal deficits, speech normal Psych: Normal affect and mood  ASSESSMENT/PLAN:  There are no diagnoses linked to this encounter. No follow-ups on file. Wells Guiles, DO 11/13/2021, 8:05 AM PGY-***, [facility] {    This will disappear when note is signed, click to select method of visit    :1}

## 2021-11-21 ENCOUNTER — Other Ambulatory Visit: Payer: Self-pay

## 2021-11-21 ENCOUNTER — Inpatient Hospital Stay (HOSPITAL_COMMUNITY): Payer: Medicaid Other

## 2021-11-21 ENCOUNTER — Inpatient Hospital Stay (HOSPITAL_COMMUNITY)
Admission: AD | Admit: 2021-11-21 | Discharge: 2021-11-21 | Disposition: A | Discharge status: Home / Self Care | Payer: Medicaid Other | Attending: Obstetrics & Gynecology | Admitting: Obstetrics & Gynecology

## 2021-11-21 ENCOUNTER — Encounter (HOSPITAL_COMMUNITY): Payer: Self-pay | Admitting: Obstetrics & Gynecology

## 2021-11-21 ENCOUNTER — Encounter: Payer: Self-pay | Admitting: Student

## 2021-11-21 ENCOUNTER — Ambulatory Visit: Payer: Medicaid Other | Admitting: Student

## 2021-11-21 VITALS — HR 78 | Ht 61.5 in | Wt 223.6 lb

## 2021-11-21 DIAGNOSIS — Z3A01 Less than 8 weeks gestation of pregnancy: Secondary | ICD-10-CM | POA: Diagnosis not present

## 2021-11-21 DIAGNOSIS — Z3491 Encounter for supervision of normal pregnancy, unspecified, first trimester: Secondary | ICD-10-CM

## 2021-11-21 DIAGNOSIS — N898 Other specified noninflammatory disorders of vagina: Secondary | ICD-10-CM | POA: Insufficient documentation

## 2021-11-21 DIAGNOSIS — Z3201 Encounter for pregnancy test, result positive: Secondary | ICD-10-CM

## 2021-11-21 DIAGNOSIS — R45851 Suicidal ideations: Secondary | ICD-10-CM

## 2021-11-21 DIAGNOSIS — O3680X0 Pregnancy with inconclusive fetal viability, not applicable or unspecified: Secondary | ICD-10-CM

## 2021-11-21 DIAGNOSIS — O26891 Other specified pregnancy related conditions, first trimester: Secondary | ICD-10-CM | POA: Diagnosis not present

## 2021-11-21 LAB — URINALYSIS, ROUTINE W REFLEX MICROSCOPIC
Bilirubin Urine: NEGATIVE
Glucose, UA: NEGATIVE mg/dL
Ketones, ur: NEGATIVE mg/dL
Leukocytes,Ua: NEGATIVE
Nitrite: NEGATIVE
Protein, ur: NEGATIVE mg/dL
Specific Gravity, Urine: 1.027 (ref 1.005–1.030)
pH: 6 (ref 5.0–8.0)

## 2021-11-21 LAB — CBC
HCT: 29.6 % — ABNORMAL LOW (ref 36.0–46.0)
Hemoglobin: 9.9 g/dL — ABNORMAL LOW (ref 12.0–15.0)
MCH: 29.4 pg (ref 26.0–34.0)
MCHC: 33.4 g/dL (ref 30.0–36.0)
MCV: 87.8 fL (ref 80.0–100.0)
Platelets: 396 10*3/uL (ref 150–400)
RBC: 3.37 MIL/uL — ABNORMAL LOW (ref 3.87–5.11)
RDW: 14.5 % (ref 11.5–15.5)
WBC: 8.2 10*3/uL (ref 4.0–10.5)
nRBC: 0 % (ref 0.0–0.2)

## 2021-11-21 LAB — WET PREP, GENITAL
Clue Cells Wet Prep HPF POC: NONE SEEN
Sperm: NONE SEEN
Trich, Wet Prep: NONE SEEN
WBC, Wet Prep HPF POC: >=10 — AB (ref <10)
Yeast Wet Prep HPF POC: NONE SEEN

## 2021-11-21 LAB — POCT URINE PREGNANCY: Preg Test, Ur: POSITIVE — AB

## 2021-11-21 LAB — HCG, QUANTITATIVE, PREGNANCY: hCG, Beta Chain, Quant, S: 104536 m[IU]/mL — ABNORMAL HIGH (ref <5)

## 2021-11-21 MED ORDER — PRENATAL VITAMINS 28-0.8 MG PO TABS: ORAL_TABLET | ORAL | 8 refills | Status: DC

## 2021-11-21 MED ORDER — DOXYLAMINE SUCCINATE (SLEEP) 25 MG PO TABS: 25.0000 mg | ORAL_TABLET | Freq: Every evening | ORAL | 1 refills | Status: DC | PRN

## 2021-11-21 NOTE — Patient Instructions (Addendum)
It was great to see you today! Thank you for choosing [facility] for your obstetric care. Carolyn Alvarez was seen for initial OB visit.   It would be best for you to go to the MAU and to be evaluated for possible ectopic pregnancy.  I will be in touch with you.  We will have you come back to get your blood work done and it would be best to have you scheduled in 4 weeks for your initial OB visit with me or your PCP.  If you are feeling suicidal or depression symptoms worsen please immediately go to:   If you are thinking about harming yourself or having thoughts of suicide, or if you know someone who is, seek help right away. If you are in crisis, make sure you are not left alone.  If someone else is in crisis, make sure he/she/they is not left alone  Call 988 OR 1-800-273-TALK  24 Hour Availability for [place]  [address] [place], [place] Crisis [phone]    Other crisis resources:  Family Service of the [employer] (Domestic Violence, Rape & Victim Assistance [phone]  [facility] [place]    (ONLY from 8am-4pm)    [phone]  Therapeutic Alternative Mobile Crisis Unit (24/7)   [phone]  [place] National Suicide Hotline   [phone] Diamantina Monks)   ________________________________________________________  Therapy and Counseling Resources Most providers on this list will take Medicaid. Patients with commercial insurance or Medicare should contact their insurance company to get a list of in network providers.  [employer] (takes children) Location 1: [address], [place], [place] [address] Location 2: [place], [place] [address] [place] ([place] speaking therapist available)(habla espanol)(take medicare and medicaid)  [place], [place], [place] [address],  [place] [email or web address] [phone]  BestDay:Psychiatry and Counseling [address] [place], [place] [address] [place], [place], [place] [address]      [phone]  [place] (spanish available) [place], [place] [address] [place] (take [facility] and medicare) [address] [place],  [address]       [phone]     [place] (virtual only) [phone]  Jinny [facility]ossom [facility] [address], [place], [place]  Family Solutions:  [place]. [place] [phone]  Journeys Counseling:  [place] [address] Fate [phone]  [facility] (under & uninsured) [address], [place] [phone]    [email or web address]    [place] [address]  [place]    [phone]  [facility] [place]     Phone:  [phone]     [place]  [place] #1 [address]      [place], [place] ext [place]: [place], [place], [place]   [place] ([place] therapist) [email or web address]  [place] [address]   [place] [address]    [phone]    [facility]   [address] [place],  [place], [place]   [place]  [facility]  [place]  [place], [place]        [phone]  Open Access/Walk In Clinic under & uninsured  [facility]  [place], [place]  Family Service of the [place],  ([place])   [place], [place]  [place]: [phone]) 8:30 - 12; 1 - 2:30  Family Service of the [place],  [place], [place]    [phone]):8:30 - 12; 2 - 3PM  [facility] [employer],  [address],  [place]; [phone]):   Mon - Fri 8 AM - 5 PM  Alcohol & Drug Services [place]  MWF 12:30 to 3:00 or call to schedule an appointment  [phone]  Specific Provider options Psychology Today  [email or web address] click on find a therapist  enter your zip code left side and select or tailor a therapist for your specific need.   [facility] Provider Directory [email or web address]  (Medicaid)   Follow all drop down to find a provider  [place] or [email or web address] [address], [place], [place] Recovery support and educational   24- Hour Availability:   [facility]  [address] [place], [place] Crisis [phone]  Family Service of the [employer] [phone]  [place]  [phone]   [place]  [phone] (after hours)  Therapeutic Alternative/Mobile Crisis   [phone]  [place] National Suicide Hotline  [phone] Diamantina Monks)  Call 911 or go to emergency room  [facility]  [phone]);  [place] and [employer]  [phone]); [place], [place], [place], [place], [place], [place], [place]   _______________________________________________________  MOMS TO BE  Activity: Moms-to-be must be very diligent about getting enough exercise, as staying active will not only help you keep your weight gains to a healthy level, but can also help you manage your symptoms, and strengthen your body for labor.  Nutrition: Even though prenatal vitamins contain lots of vitamins and nutrients you need, it's important to reinforce this  nutritional need through healthy eating. Pregnant women should do their best to eat plenty of fresh fruits, veggies, and meats, and avoid eating overly fatty or processed foods, as the body works best with only natural fuels.  Sleep: Getting the right amount of ZZZs is imperative during pregnancy, as it allows your body to recover to the strength it needs to help baby develop healthily, and can help you manage your toughest symptoms.  Blood pressure: Abnormal blood pressure, whether high or low, can both be dangerous for you and Baby during pregnancy, so it's very important to monitor your BP readings so you'll know that all is well.   Genetic Screening Options For you  NIPT stands for noninvasive prenatal testing. It's a screening test offered during pregnancy to see if the fetus is at risk for having a chromosomal disorder like Down syndrome (trisomy 22), trisomy 41 (Edwards syndrome) and trisomy 19 (Patau syndrome). The test can also determine the sex of the fetus.   Go to the [facility] at [place] [facility] if: You have pain in your lower abdomen or pelvic area Your water breaks.  Sometimes it is a big gush of fluid, sometimes it is just a trickle that keeps getting your underwear wet or running down your legs You have vaginal bleeding.  ORANGE CARD The "[employer]" guarantees healthcare access to people who meet eligibility requirements for services within [facility]. Your orange card gives you access to everything that the [facility] has to offer. When qualified, patients are assigned to a medical home to receive care management which includes referrals to specialty care.  Your orange card gives you access to everything that the [facility] has to offer. All you need to do to take part in your health care is to give [facility] a call at [phone] or visit their website [email or web address]  ELIGIBILITY  REQUIREMENTS If you answer Yes to ALL 4 below, then speak with a [facility] eligibility and enrollment person (at the [facility] and [place]) who will explain the program and determine if you qualify  1. You do not have a regular primary care doctor ([place]). 2. You are not eligible for state or federally sponsored health insurance including the [place] (Exemption Required), Medicare, Medicaid (except a Financial risk analyst) or Human resources officer. 3. You live in [facility] (only). (min. 3 months, 6 months preferred) 4. Your annual income is between 0-200% of the federal poverty level  Pump Back Program (FAP ) The FAP program is for [place] and [place], [place] and [place] counties of [place] residents who are uninsured patients and have received hospital services with account balance(s) greater than or equal to $10,000. Qualified uninsured patients who apply to participate in the FAP will be reviewedby the [facility] Counseling Department. A revenue cycle representative will review the patient FAP application. If a patient fully cooperates with this process and no coverage is available, the account will be evaluated for financial assistance based on their income as compared to federal poverty guidelines (FPG). Patients with income less than or equal to 200% of FPG will receive a 100% discount. Patients between 201% and 400% of the FPG will qualify for partial discounts. Payment options are available to assist patients in paying their remaining balance.  Psychologist, clinical Program (FAS) The Psychologist, clinical program is for [place], [place], [place] and [place] counties of [place] residents who are uninsured patients and have received hospital services that resulted in a balance less than $10,000. Each account  will be automatically reviewed for a financial assistance discount prior to billing. Eligibility is based on a financial assistance score from a third party vendor that indicates the likelihood a patient lives in poverty. Patients with qualifying accounts will be extended an adjustment to their account as part of the financial assistance program. Patients will be notified in writing of their eligibility status in the financial assistance program.  Patients can apply for the Financial Assistance Program by downloading an application at [email or web address], requesting by mail, ([place] [facility], [address] [place], [place]. [address]), contacting customer service [phone]), or in person at any [facility] facility.  A copy of the [place] and Financial Assistance policy is available upon request electronically and/or by mail.  In addition to the financial assistance programs, two discount programs are available to [facility] patients:  Uninsured Discount Uninsured patients will receive a discount off gross charges on all medically necessary services. This is applied automatically and no action is needed by the patient to receive this discount. The uninsured discount is available to all uninsured patients. Calculations are based on the amount generally billed (AGB) to  Medicare and [place].  Hardship Settlement This program is designed to assist [place], [place], [place] and [place] counties of [place] residents who have had a catastrophic medical event regardless of their insurance coverage that has resulted in very large hospital bills in comparison to their financial resources. Patients who have incurred a balance of greater than $5,000 after all insurance or third party payments and the remaining balance is greater than 20% of their total household financial resources may be eligible for a [employer]. Patients seeking a hardship settlement discount should inquire about this program by calling the customer service department [phone]) after receiving their first statement.  Contact information: [place] [facility] [address] Patient [place], [place] [phone] Attn: Customer Service  _______________________________________________________  Safe Medications in Pregnancy   Acne: Benzoyl Peroxide Salicylic Acid  Backache/Headache: Tylenol: 2 regular strength every 4 hours OR              2 Extra strength every 6 hours  Colds/Coughs/Allergies: Benadryl (alcohol free) 25 mg every 6 hours as needed Breath right strips Claritin Cepacol throat lozenges Chloraseptic throat spray Cold-Eeze- up to three times per day Cough drops, alcohol free Flonase (by prescription only) Guaifenesin Mucinex Robitussin DM (plain only, alcohol free) Saline nasal spray/drops Sudafed (pseudoephedrine) & Actifed ** use only after [redacted] weeks gestation and if you do not have high blood pressure Tylenol Vicks Vaporub Zinc lozenges Zyrtec   Constipation: Colace Ducolax suppositories Fleet enema Glycerin suppositories Metamucil Milk of magnesia Miralax Senokot Smooth move tea  Diarrhea: Kaopectate Imodium A-D  *NO pepto Bismol  Hemorrhoids: Anusol Anusol HC Preparation H Tucks  Indigestion: Tums Maalox Mylanta Zantac  Pepcid  Insomnia: Benadryl (alcohol free) '25mg'$  every 6 hours as needed Tylenol PM Unisom, no Gelcaps  Leg Cramps: Tums MagGel  Nausea/Vomiting:  Bonine Dramamine Emetrol Ginger extract Sea bands Meclizine   Take Unisom and Vitamin B6 together for nausea and vomiting Unisom (doxylamine succinate 25 mg tablets) Take one tablet daily at bedtime. If symptoms are not adequately controlled, the dose can be increased to a maximum recommended dose of two tablets daily (1/2 tablet in the morning,  1/2 tablet mid-afternoon and one at bedtime). Vitamin B6 '100mg'$  tablets. Take one tablet twice a day (up to 200 mg per day).  Skin Rashes: Aveeno products Benadryl cream or '25mg'$  every 6 hours as needed Calamine Lotion 1% cortisone cream  Yeast infection: Gyne-lotrimin 7 Monistat 7   **If taking multiple medications, please check labels to avoid duplicating the same active ingredients **take medication as directed on the label ** Do not exceed 4000 mg of tylenol in 24 hours **Do not take medications that contain aspirin or ibuprofen unless directed by you health care provider   ________________________________  If you haven't already, sign up for My Chart to have easy access to your labs results, and communication with your primary care physician.  We are checking some labs today. If they are abnormal, I will call you. If they are normal, I will send you a MyChart message (if it is active) or a letter in the mail. If you do not hear about your labs in the next 2 weeks, please call the office.   You should return to our clinic Return in about 4 weeks (around 12/19/2021) for initial OB.  I recommend that you always bring your medications to each appointment as this makes it easy to ensure you are  on the correct medications and helps [place] not miss refills when you need them.  Please arrive 15 minutes before your appointment to ensure smooth check in process.  We appreciate your efforts in making this happen.  Please call the clinic at [phone] if your symptoms worsen or you have any concerns.  Thank you for allowing me to participate in your care, Wells Guiles, DO

## 2021-11-21 NOTE — Assessment & Plan Note (Addendum)
Dating unknown, bleeding when wiping present in clinic today and combination with intermittent "stretching" pain intermittently.  Concern for ectopic pregnancy, unable to get scheduled for ultrasound until next week.  Upon chart review, a positive, therefore not requiring RhoGAM.  Advised going to MAU for ultrasound and further evaluation.

## 2021-11-21 NOTE — Assessment & Plan Note (Signed)
Upon chart review, psychiatric hospitalization in the past.  Not currently medically managed, has protective factors but would benefit from regular therapy and psychiatry.  Provided resources to patient and this should be continually monitored given recent pregnancy.

## 2021-11-21 NOTE — MAU Provider Note (Signed)
History     [id number]  Arrival date and time: 11/21/21 1548    Chief Complaint  Patient presents with   Vaginal Bleeding     HPI Carolyn Alvarez is a 31 y.o. at 32w6dwho presents for vaginal bleeding. Reports brown spotting for the last week. Saw her PCP earlier today & was instructed to come here for further evaluation due to symptoms. Denies abdominal pain, vaginal irritation, or recent intercourse. Was treated for trichomonas over a month ago.    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   Current Outpatient Medications on File Prior to Encounter  Medication Sig Dispense Refill   albuterol (PROAIR HFA) 108 (90 Base) MCG/ACT inhaler Inhale 2 puffs into the lungs every 4 (four) hours as needed for wheezing or shortness of breath. DISPENSE brand or generic which ever is preferred 8 g 1   doxylamine, Sleep, (UNISOM) 25 MG tablet Take 1 tablet (25 mg total) by mouth at bedtime as needed. 30 tablet 1   EPINEPHrine 0.3 mg/0.3 mL IJ SOAJ injection Inject 0.3 mg into the muscle as needed for anaphylaxis. 1 each 1   fluticasone (FLONASE) 50 MCG/ACT nasal spray Place 1 spray into both nostrils daily. (Patient not taking: Reported on 10/08/2021) 18.2 mL 5   fluticasone (FLOVENT HFA) 110 MCG/ACT inhaler Inhale 2 puffs into the lungs in the morning and at bedtime. 1 each 3   Prenatal Vit-Fe  Fumarate-FA (PRENATAL VITAMINS) 28-0.8 MG TABS Take daily 30 tablet 8     ROS Pertinent positives and negative per HPI, all others reviewed and negative  Physical Exam   BP 125/63 (BP Location: Right Arm)   Pulse 76   Temp 98.8 F (37.1 C) (Oral)   Resp 20   Ht '5\' 1"'$  (1.549 m)   Wt 101.3 kg   LMP 09/27/2021   SpO2 100% Comment: room air  BMI 42.21 kg/m   Patient Vitals for the past 24 hrs:  BP Temp Temp src Pulse Resp SpO2 Height Weight  11/21/21 2006 -- -- -- -- 20 -- -- --  11/21/21 1609 125/63 98.8 F (37.1 C) Oral 76 16 100 % -- --  11/21/21 1606 -- -- -- -- -- -- '5\' 1"'$  (1.549 m) 101.3 kg    Physical Exam Vitals and nursing note reviewed.  Constitutional:      General: She is not in acute distress.    Appearance: Normal appearance.  HENT:     Head: Normocephalic and atraumatic.  Eyes:     General: No scleral icterus.  Conjunctiva/sclera: Conjunctivae normal.  Pulmonary:     Effort: Pulmonary effort is normal. No respiratory distress.  Abdominal:     Palpations: Abdomen is soft.     Tenderness: There is no abdominal tenderness.  Neurological:     Mental Status: She is alert.  Psychiatric:        Mood and Affect: Mood normal.        Behavior: Behavior normal.       Labs Results for orders placed or performed during the hospital encounter of 11/21/21 (from the past 24 hour(s))  Wet prep, genital     Status: Abnormal   Collection Time: 11/21/21  5:19 PM  Result Value Ref Range   Yeast Wet Prep HPF POC NONE SEEN NONE SEEN   Trich, Wet Prep NONE SEEN NONE SEEN   Clue Cells Wet Prep HPF POC NONE SEEN NONE SEEN   WBC, Wet Prep HPF POC >=10 (A) <10   Sperm NONE SEEN   CBC     Status: Abnormal   Collection Time: 11/21/21  5:26 PM  Result Value Ref Range   WBC 8.2 4.0 - 10.5 K/uL   RBC 3.37 (L) 3.87 - 5.11 MIL/uL   Hemoglobin 9.9 (L) 12.0 - 15.0 g/dL   HCT 29.6 (L) 36.0 - 46.0 %   MCV 87.8 80.0 - 100.0 fL   MCH 29.4 26.0 - 34.0 pg   MCHC 33.4 30.0 -  36.0 g/dL   RDW 14.5 11.5 - 15.5 %   Platelets 396 150 - 400 K/uL   nRBC 0.0 0.0 - 0.2 %  hCG, quantitative, pregnancy     Status: Abnormal   Collection Time: 11/21/21  5:26 PM  Result Value Ref Range   hCG, Beta Chain, Quant, S 104,536 (H) <5 mIU/mL  Urinalysis, Routine w reflex microscopic Urine, Clean Catch     Status: Abnormal   Collection Time: 11/21/21  5:45 PM  Result Value Ref Range   Color, Urine YELLOW YELLOW   APPearance HAZY (A) CLEAR   Specific Gravity, Urine 1.027 1.005 - 1.030   pH 6.0 5.0 - 8.0   Glucose, UA NEGATIVE NEGATIVE mg/dL   Hgb urine dipstick MODERATE (A) NEGATIVE   Bilirubin Urine NEGATIVE NEGATIVE   Ketones, ur NEGATIVE NEGATIVE mg/dL   Protein, ur NEGATIVE NEGATIVE mg/dL   Nitrite NEGATIVE NEGATIVE   Leukocytes,Ua NEGATIVE NEGATIVE   RBC / HPF 0-5 0 - 5 RBC/hpf   WBC, UA 0-5 0 - 5 WBC/hpf   Bacteria, UA RARE (A) NONE SEEN   Squamous Epithelial / LPF 0-5 0 - 5   Mucus PRESENT     Imaging US OB LESS THAN 14 WEEKS WITH OB TRANSVAGINAL  Result Date: 11/21/2021 CLINICAL DATA:  Vaginal bleeding in pregnancy EXAM: OBSTETRIC <14 WK [place] AND TRANSVAGINAL OB US TECHNIQUE: Both transabdominal and transvaginal ultrasound examinations were performed for complete evaluation of the gestation as well as the maternal uterus, adnexal regions, and pelvic cul-de-sac. Transvaginal technique was performed to assess early pregnancy. COMPARISON:  None Available. FINDINGS: Intrauterine gestational sac: Single intrauterine gestational sac Yolk sac:  Visualized Embryo:  Visualized Cardiac Activity: Visualized Heart Rate: 138 bpm CRL:  14.9 mm   7 w   6 d                  [place] EDC: 07/04/2022 Subchorionic hemorrhage:  None visualized. Maternal uterus/adnexae: Ovaries are within normal limits. Right ovary measures 2.3 x 1.3 x 1.9 cm and the left ovary measures  3.3 x 2.5 x 2.9 cm. Trace free fluid IMPRESSION: Single viable intrauterine pregnancy as above.  Trace free fluid. Electronically  Signed   By: Donavan Foil M.D.   On: 11/21/2021 19:33    MAU Course  Procedures Lab Orders         Wet prep, genital         Urinalysis, Routine w reflex microscopic Urine, Clean Catch         CBC         hCG, quantitative, pregnancy    No orders of the defined types were placed in this encounter.  Imaging Orders         US OB LESS THAN 14 WEEKS WITH OB TRANSVAGINAL     MDM +UPT UA, wet prep, GC/chlamydia, CBC, ABO/Rh, quant hCG, and [place] today to rule out ectopic pregnancy which can be life threatening.   RH positive  Wet prep negative  Ultrasound shows live IUP measuring 80w6dAssessment and Plan   1. Vaginal discharge during pregnancy in first trimester   2. Normal IUP (intrauterine pregnancy) on prenatal ultrasound, first trimester   3. [redacted] weeks gestation of pregnancy    -Discharge home in stable condition -F/u with OB as scheduled -GC/CT pending -Reviewed bleeding precautions & reasons to return to [facility] NP 11/21/21 8:14 PM

## 2021-11-21 NOTE — MAU Note (Signed)
Carolyn Alvarez is a 31 y.o. at 97w6dhere in MAU reporting: seeing bleeding when she wipes for the last week. States bleeding is brown. Feeling some stretching, states not painful.   LMP: 09/27/2021  Onset of complaint: ongoing  Pain score: 0/10  Vitals:   11/21/21 1609  BP: 125/63  Pulse: 76  Resp: 16  Temp: 98.8 F (37.1 C)  SpO2: 100%     FHT:NA  Lab orders placed from triage: UA

## 2021-11-21 NOTE — Discharge Instructions (Signed)
Return to care  If you have heavier bleeding that soaks through more than 2 pads per hour for an hour or more If you bleed so much that you feel like you might pass out or you do pass out If you have significant abdominal pain that is not improved with Tylenol    Safe Medications in Pregnancy   Acne: Benzoyl Peroxide Salicylic Acid  Backache/Headache: Tylenol: 2 regular strength every 4 hours OR              2 Extra strength every 6 hours  Colds/Coughs/Allergies: Benadryl (alcohol free) 25 mg every 6 hours as needed Breath right strips Claritin Cepacol throat lozenges Chloraseptic throat spray Cold-Eeze- up to three times per day Cough drops, alcohol free Flonase (by prescription only) Guaifenesin Mucinex Robitussin DM (plain only, alcohol free) Saline nasal spray/drops Sudafed (pseudoephedrine) & Actifed ** use only after [redacted] weeks gestation and if you do not have high blood pressure Tylenol Vicks Vaporub Zinc lozenges Zyrtec   Constipation: Colace Ducolax suppositories Fleet enema Glycerin suppositories Metamucil Milk of magnesia Miralax Senokot Smooth move tea  Diarrhea: Kaopectate Imodium A-D  *NO pepto Bismol  Hemorrhoids: Anusol Anusol HC Preparation H Tucks  Indigestion: Tums Maalox Mylanta Zantac  Pepcid  Insomnia: Benadryl (alcohol free) '25mg'$  every 6 hours as needed Tylenol PM Unisom, no Gelcaps  Leg Cramps: Tums MagGel  Nausea/Vomiting:  Bonine Dramamine Emetrol Ginger extract Sea bands Meclizine  Nausea medication to take during pregnancy:  Unisom (doxylamine succinate 25 mg tablets) Take one tablet daily at bedtime. If symptoms are not adequately controlled, the dose can be increased to a maximum recommended dose of two tablets daily (1/2 tablet in the morning, 1/2 tablet mid-afternoon and one at bedtime). Vitamin B6 '100mg'$  tablets. Take one tablet twice a day (up to 200 mg per day).  Skin Rashes: Aveeno  products Benadryl cream or '25mg'$  every 6 hours as needed Calamine Lotion 1% cortisone cream  Yeast infection: Gyne-lotrimin 7 Monistat 7  Gum/tooth pain: Anbesol  **If taking multiple medications, please check labels to avoid duplicating the same active ingredients **take medication as directed on the label ** Do not exceed 4000 mg of tylenol in 24 hours **Do not take medications that contain aspirin or ibuprofen

## 2021-11-21 NOTE — Progress Notes (Signed)
  SUBJECTIVE:   CHIEF COMPLAINT / HPI:   31 year old G4 P1-0-3-1 presenting after positive pregnancy test at home on 11/09/2021.  LMP 09/27/21.  This is not a planned pregnancy but she is not disappointed, she is monogamous with her partner.  Notes her usual menstrual cycle is irregular. Endorses dark red blood strictly when she wipes which began less than 1 week ago.  She also endorses pain which feels like stretching pain intermittently.  Endorsing some nausea/vomiting.   Has had suicidal ideation in the past beginning with postpartum depression. Seen psychiatry and counseling in the past. Denies thoughts of hurting herself or making any plans. Protective factors include her bestfriend (who is a Social worker) and her 29 y/o daughter.  PERTINENT  PMH / PSH: Depression, anemia  OBJECTIVE:  Pulse 78   Ht 5' 1.5" (1.562 m)   Wt 223 lb 9.6 oz (101.4 kg)   LMP 09/27/2021   SpO2 100%   BMI 41.56 kg/m  Physical Exam Constitutional:      Appearance: Normal appearance.  Cardiovascular:     Rate and Rhythm: Normal rate and regular rhythm.     Heart sounds: Normal heart sounds.  Pulmonary:     Effort: Pulmonary effort is normal.     Breath sounds: Normal breath sounds.  Abdominal:     General: Abdomen is flat. Bowel sounds are normal.     Tenderness: There is no abdominal tenderness.  Neurological:     Mental Status: She is alert.  Psychiatric:        Mood and Affect: Mood normal.        Behavior: Behavior normal.    ASSESSMENT/PLAN:  Positive pregnancy test Assessment & Plan: Confirmed in clinic.  Unisom and prenatal vitamin sent.  Provided resources for safe pregnancy medications and general recommendations for pregnant women.  Reviewed medications, discontinue Allegra. Follow-up in 4 weeks for initial OB, will need lab appointment for initial OB labs and hemoglobinopathy.  Orders: -     POCT urine pregnancy -     Doxylamine Succinate (Sleep); Take 1 tablet (25 mg total) by mouth at  bedtime as needed.  Dispense: 30 tablet; Refill: 1 -     Prenatal Vitamins; Take daily  Dispense: 30 tablet; Refill: 8 -     CBC/D/Plt+RPR+Rh+ABO+RubIgG...; Future -     Hgb Fractionation Cascade; Future  Pregnancy of unknown anatomic location Assessment & Plan: Dating unknown, bleeding when wiping present in clinic today and combination with intermittent "stretching" pain intermittently.  Concern for ectopic pregnancy, unable to get scheduled for ultrasound until next week.  Upon chart review, a positive, therefore not requiring RhoGAM.  Advised going to MAU for ultrasound and further evaluation.   Suicidal ideation Assessment & Plan: Upon chart review, psychiatric hospitalization in the past.  Not currently medically managed, has protective factors but would benefit from regular therapy and psychiatry.  Provided resources to patient and this should be continually monitored given recent pregnancy.   Return in about 4 weeks (around 12/19/2021) for initial OB. Wells Guiles, DO 11/21/2021, 3:44 PM PGY-2, [place]

## 2021-11-21 NOTE — Assessment & Plan Note (Signed)
Confirmed in clinic.  Unisom and prenatal vitamin sent.  Provided resources for safe pregnancy medications and general recommendations for pregnant women.  Reviewed medications, discontinue Allegra. Follow-up in 4 weeks for initial OB, will need lab appointment for initial OB labs and hemoglobinopathy.

## 2021-11-24 LAB — GC/CHLAMYDIA PROBE AMP (~~LOC~~) NOT AT ARMC
Chlamydia: NEGATIVE
Comment: NEGATIVE
Comment: NORMAL
Neisseria Gonorrhea: NEGATIVE

## 2021-11-26 ENCOUNTER — Other Ambulatory Visit: Payer: Medicaid Other

## 2021-11-28 ENCOUNTER — Other Ambulatory Visit: Payer: Medicaid Other

## 2022-01-20 ENCOUNTER — Encounter: Payer: Self-pay | Admitting: Student

## 2022-01-20 ENCOUNTER — Ambulatory Visit (INDEPENDENT_AMBULATORY_CARE_PROVIDER_SITE_OTHER): Payer: Medicaid Other | Admitting: Student

## 2022-01-20 VITALS — BP 113/83 | HR 79 | Wt 220.0 lb

## 2022-01-20 DIAGNOSIS — Z3A16 16 weeks gestation of pregnancy: Secondary | ICD-10-CM

## 2022-01-20 DIAGNOSIS — J4530 Mild persistent asthma, uncomplicated: Secondary | ICD-10-CM | POA: Diagnosis not present

## 2022-01-20 DIAGNOSIS — O99512 Diseases of the respiratory system complicating pregnancy, second trimester: Secondary | ICD-10-CM | POA: Diagnosis not present

## 2022-01-20 DIAGNOSIS — Z3201 Encounter for pregnancy test, result positive: Secondary | ICD-10-CM

## 2022-01-20 DIAGNOSIS — Z3492 Encounter for supervision of normal pregnancy, unspecified, second trimester: Secondary | ICD-10-CM

## 2022-01-20 DIAGNOSIS — Z3490 Encounter for supervision of normal pregnancy, unspecified, unspecified trimester: Secondary | ICD-10-CM | POA: Insufficient documentation

## 2022-01-20 DIAGNOSIS — O0990 Supervision of high risk pregnancy, unspecified, unspecified trimester: Secondary | ICD-10-CM | POA: Insufficient documentation

## 2022-01-20 MED ORDER — ALBUTEROL SULFATE HFA 108 (90 BASE) MCG/ACT IN AERS: 2.0000 | INHALATION_SPRAY | RESPIRATORY_TRACT | 1 refills | Status: DC | PRN

## 2022-01-20 MED ORDER — ASPIRIN 81 MG PO TBEC: 81.0000 mg | DELAYED_RELEASE_TABLET | Freq: Every day | ORAL | 12 refills | Status: DC
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
## 2022-01-20 NOTE — Assessment & Plan Note (Addendum)
Clinic  [facility] Prenatal Labs  Dating  Early U/S Blood type:    A positive  Genetic Screen 1 Screen:    AFP:     Quad:     NIPS: Antibody:   Anatomic [place]  Ordered Rubella:    GTT Early:               Third trimester:  RPR: Non Reactive (08/30 1109)   Flu vaccine  HBsAg:     TDaP vaccine                                               Rhogam: HIV: Non Reactive (08/30 1109)   Baby Food  Breastfeeding                                            GBS: (For PCN allergy, check sensitivities)  Contraception  Undecided Pap: 09/2021, NILM  Circumcision  If boy, yes   Pediatrician    Support Person

## 2022-01-20 NOTE — Patient Instructions (Addendum)
So wonderful to meet you today, Carolyn Alvarez!  START taking Aspirin 81 mg every day. Continue taking your prenatal vitamin every day.  We will see you at your next appointment with Dr. Madison Hickman on 02/13/2022 at 9:30 am. Arrive 15 minutes early please.  Please schedule a lab visit appointment at the front desk to come in for your 1 hour glucose test.   [facility] to the [facility] at [facility] for your baby's anatomy ultrasound at your scheduled time I gave you.   Pregnancy Related Return Precautions The follow are signs/symptoms that are abnormal in pregnancy and may require further evaluation by a physician: Go to the MAU at [place] at [facility] if: You have cramping/contractions that do not go away with drinking water, especially if they are lasting 30 seconds to 1.5 minutes, coming and going every 5-10 minutes for an hour or more, or are getting stronger and you cannot walk or talk while having a contraction/cramp. Your water breaks.  Sometimes it is a big gush of fluid, sometimes it is just a trickle that keeps getting your underwear wet or running down your legs You have vaginal bleeding.    You do not feel your baby moving like normal.  If you do not, get something to eat and drink (something cold or something with sugar like peanut butter or juice) and lay down and focus on feeling your baby move. If your baby is still not moving like normal, you should go to MAU. You should feel your baby move 6 times in one hour, or 10 times in two hours. You have a persistent headache that does not go away with 1 g of Tylenol, vision changes, chest pain, difficulty breathing, severe pain in your right upper abdomen, worsening leg swelling- these can all be signs of high blood pressure in pregnancy and need to be evaluated by a provider immediately  These are all concerning in pregnancy and if you have any of these I recommend you call your PCP and present to the  Maternity Admissions Unit (map below) for further evaluation.  For any pregnancy-related emergencies, please go to the Maternity Admissions Unit in the [place] at [place] will use hospital Entrance C.    Dr. Owens Shark [phone]

## 2022-01-23 LAB — CBC/D/PLT+RPR+RH+ABO+RUBIGG...
Basophils Absolute: 0 10*3/uL (ref 0.0–0.2)
Basos: 0 %
Bilirubin, UA: NEGATIVE
EOS (ABSOLUTE): 0.1 10*3/uL (ref 0.0–0.4)
Eos: 1 %
Glucose, UA: NEGATIVE
HCV Ab: NONREACTIVE
HIV Screen 4th Generation wRfx: NONREACTIVE
Hematocrit: 31.6 % — ABNORMAL LOW (ref 34.0–46.6)
Hemoglobin: 10.2 g/dL — ABNORMAL LOW (ref 11.1–15.9)
Hepatitis B Surface Ag: NEGATIVE
Immature Grans (Abs): 0 10*3/uL (ref 0.0–0.1)
Immature Granulocytes: 0 %
Ketones, UA: NEGATIVE
Leukocytes,UA: NEGATIVE
Lymphocytes Absolute: 2.5 10*3/uL (ref 0.7–3.1)
Lymphs: 38 %
MCH: 28.9 pg (ref 26.6–33.0)
MCHC: 32.3 g/dL (ref 31.5–35.7)
MCV: 90 fL (ref 79–97)
Monocytes Absolute: 0.5 10*3/uL (ref 0.1–0.9)
Monocytes: 8 %
Neutrophils Absolute: 3.5 10*3/uL (ref 1.4–7.0)
Neutrophils: 53 %
Nitrite, UA: NEGATIVE
Platelets: 370 10*3/uL (ref 150–450)
RBC, UA: NEGATIVE
RBC: 3.53 x10E6/uL — ABNORMAL LOW (ref 3.77–5.28)
RDW: 13.4 % (ref 11.7–15.4)
RPR Ser Ql: NONREACTIVE
Rh Factor: POSITIVE
Rubella Antibodies, IGG: 3.21 index (ref >0.99)
Specific Gravity, UA: >=1.03 — AB (ref 1.005–1.030)
Urobilinogen, Ur: 1 mg/dL (ref 0.2–1.0)
WBC: 6.6 10*3/uL (ref 3.4–10.8)
pH, UA: 5.5 (ref 5.0–7.5)

## 2022-01-23 LAB — AB SCR+ANTIBODY ID: Antibody Screen: POSITIVE — AB

## 2022-01-23 LAB — HGB FRACTIONATION CASCADE
Hgb A2: 2.3 % (ref 1.8–3.2)
Hgb A: 97.7 % (ref 96.4–98.8)
Hgb F: 0 % (ref 0.0–2.0)
Hgb S: 0 %

## 2022-01-23 LAB — URINE CULTURE, OB REFLEX

## 2022-01-23 LAB — VARICELLA ZOSTER ANTIBODY, IGG: Varicella zoster IgG: 1877 index (ref >165)

## 2022-01-23 LAB — MICROSCOPIC EXAMINATION: Casts: NONE SEEN /lpf

## 2022-01-23 LAB — HCV INTERPRETATION

## 2022-01-25 LAB — PANORAMA PRENATAL TEST FULL PANEL:PANORAMA TEST PLUS 5 ADDITIONAL MICRODELETIONS: FETAL FRACTION: 4.8

## 2022-02-09 NOTE — L&D Delivery Note (Signed)
Obstetrical Delivery Note   Date of Delivery:   06/30/2022 Primary OB:   [facility] Gestational Age/EDD: [redacted]w[redacted]d Reason for Admission: IOL for GDMA2 Antepartum complications: Borderline LGA with large AC, polyhydramnios, history of shoulder dystocia, patient declined 37 or 38wk induction of labor, patient declined primary c-section for this delivery.   Delivered By:   Cornelia Copa MD  Delivery Type:   spontaneous vaginal delivery  Delivery Details:   Once patient started pushing, I came into the room, where she was pushing with Dr. Nobie Putnam. She pushed well and after about 15 minutes she brought the baby to +4; the baby tolerated the pushing well. With next push, I identified the "turtle sign", had mom stop pushing, called a shoulder dystocia. The mom was already in [place] and I had Dr. Nobie Putnam cut a right medio-lateral episiotomy. I then went for and delivered the posterior arm, which I believe was the right arm. With grasping the axilias and the body, I wasn't able to deliver the baby so I cut a bigger episotomy, and  I had the RN deliver suprapubic pressure from the patient's right to the patient's left. This, along with grasping the body helped to deliver the baby. The cord was immediately clamped and cut and handed to the awaiting NICU team; Total shoulder dystocia time was 2 to 2.5 minutes. Anesthesia:    none; local for the repair Intrapartum complications: None GBS:    PCR negative Laceration:    3b. The 3b laceration was repaired with a 1-0 vicryl stitches of the torn EAS ends for an end to end repair. The remainder was completed with 2-0 vicryl. Ancef 2gm x 1 ordered Episiotomy:    Right medio-lateral Rectal exam:   Negative pre and post repair Placenta:    Delivered and expressed via active management. Intact: yes. To pathology: no.  Delayed Cord Clamping: no Estimated Blood Loss:   Baby:    Liveborn female, APGARs 7/9, weight  4210gm Arterial pH 7.26, CO2 50, Bicarb 24.3 Venous pH 7.27, CO2 53, Bicarb 22.4  Cornelia Copa. MD Attending [facility])

## 2022-02-13 ENCOUNTER — Ambulatory Visit (INDEPENDENT_AMBULATORY_CARE_PROVIDER_SITE_OTHER): Payer: Medicaid Other | Admitting: Student

## 2022-02-13 ENCOUNTER — Ambulatory Visit: Payer: Medicaid Other | Admitting: Student

## 2022-02-13 VITALS — BP 102/70 | HR 77 | Wt 245.0 lb

## 2022-02-13 DIAGNOSIS — D649 Anemia, unspecified: Secondary | ICD-10-CM

## 2022-02-13 DIAGNOSIS — M79672 Pain in left foot: Secondary | ICD-10-CM

## 2022-02-13 DIAGNOSIS — O99012 Anemia complicating pregnancy, second trimester: Secondary | ICD-10-CM

## 2022-02-13 DIAGNOSIS — Z3A20 20 weeks gestation of pregnancy: Secondary | ICD-10-CM

## 2022-02-13 DIAGNOSIS — O99891 Other specified diseases and conditions complicating pregnancy: Secondary | ICD-10-CM

## 2022-02-13 LAB — POCT 1 HR PRENATAL GLUCOSE: Glucose 1 Hr Prenatal, POC: 172 mg/dL

## 2022-02-13 MED ORDER — ASPIRIN 81 MG PO TBEC: 81.0000 mg | DELAYED_RELEASE_TABLET | Freq: Every day | ORAL | 12 refills | Status: DC

## 2022-02-13 NOTE — Progress Notes (Deleted)
  Patient Name: Carolyn Alvarez Date of Birth: 22-Jun-1990 [place] Prenatal Visit  Carolyn Alvarez is a 32 y.o. [id number] at 58w6dhere for routine follow up. She is dated by early ultrasound.  She reports {symptoms; pregnancy related:14538}.  She denies vaginal bleeding.  See flow sheet for details.  There were no vitals filed for this visit.  A/P: Pregnancy at 166w6d Doing well.    Routine Prenatal Care:  Dating reviewed, dating tab is correct Fetal heart tones {appropriate:23337} Influenza vaccine {given:23340}  COVID vaccination was discussed and ***.  The patient has the following indication for screening preexisting diabetes: BMI > 25 and high risk ethnicity (Latino, AfSerbiamerican, Native American, [place] Am[place]omet[place]. Anatomy ultrasound ordered to be scheduled at 18-20 weeks. Patient received genetic screening (NJohnsie Cancelanorama), noted to be low risk. Pregnancy education including expected weight gain in pregnancy, OTC medication use, continued use of prenatal vitamin, smoking cessation if applicable, and nutrition in pregnancy.   Bleeding and pain precautions reviewed. The patient has the following indications for aspirinto begin 81 mg at 12-16 weeks: One high risk condition: {fmcaspirinobhigh:26167} MORE than one moderate risk condition: {fmcaspirinobmoderate:26168} Aspirin {WAS/WAS NOT:[phone]::"was not"}  recommended today based upon above risk factors (one high risk condition or more than one moderate risk factor)   2. Pregnancy issues include the following and were addressed as appropriate today:  Anemia with hemoglobin 10.2: Problem list  and pregnancy box updated: {yes/no:20286::"Yes"}.   Follow up 4 weeks.

## 2022-02-13 NOTE — Patient Instructions (Incomplete)
It was great to see you today! Thank you for choosing [facility] for your obstetric care. Carolyn Alvarez was seen for OB visit.   Baby is doing well, great job!! We will need an anatomy ultrasound of baby in the coming weeks, but we will schedule this. Sometimes mother's need aspirin in pregnancy for various reasons to keep baby safe. I recommend taking aspirin during pregnancy.  Commonly Asked Questions During Pregnancy  How Will I Feel When I'm Pregnant? Pregnancy symptoms in the first trimester of pregnancy may not appear until the middle or end of the second month. Hormonal changes will cause tenderness in your breasts, and you may begin to feel more tired than usual. Food cravings, an increase in the need to urinate, and morning sickness may all be more noticeable.  Pregnancy symptoms in the second trimester are more prominent. You may start to feel the baby move and become more active. Dental issues, nasal/sinus problems, and skin irritations can begin to appear. Heartburn, leg cramps, dizziness, and a vaginal discharge are also common. Every woman is different when it comes to the symptoms they experience, and some may not experience any at all. Pregnancy symptoms in the third trimester can include increased frequency in urination, leg cramps, constipation, ligament pain in the abdomen, and weight gain. Back pain and Braxton Hicks contractions will become increasingly more common.  Why is nutrition during pregnancy important? Eating well is one of the best things you can do during pregnancy. Good nutrition helps you handle the extra demands on your body as your pregnancy progresses. The goal is to balance getting enough nutrients to support the growth of your fetus and maintaining a healthy weight.  How much water should I drink during pregnancy? During pregnancy you should drink 8 to 12 cups (64 to 96 ounces) of water every day. Water has many benefits. It aids digestion and  helps form the amniotic fluid around the fetus. Water also helps nutrients circulate in the body and helps waste leave the body.  What can I do to help with nausea? Eat dry toast or crackers in the morning before you get out of bed to avoid moving around on an empty stomach. Eat five or six "mini meals" a day to ensure that your stomach is never empty. Eat frequent bites of foods like nuts, fruits, or crackers.  What can help with constipation during pregnancy? Constipation is common near the end of pregnancy. Eating more foods with fiber can help fight constipation. Fiber is found in fruits, vegetables, whole grains, beans, nuts, and seeds. You should aim for about 25 grams of fiber in your diet each day. Drink a lot of water as you increase your fiber intake.  How much coffee can I drink while I'm pregnant? Research suggests that moderate caffeine consumption (less than 200 milligrams per day) does not cause miscarriage or preterm birth. That's the amount in one 12-ounce cup of coffee. Remember that caffeine also is found in tea, chocolate, energy drinks, and soft drinks. Caffeine can interfere with sleep and contribute to nausea and light-headedness. Caffeine also can increase urination and lead to dehydration.  What can I do to prevent or ease back pain during pregnancy? There are several things you can do to prevent or ease back pain. For example, wear supportive clothing and shoes. Pay attention to your position when sitting, sleeping, and lifting things. If you need to stand for a long time, rest one foot on a stool or  a box to take the strain off your back. You also can use heat or cold to soothe sore muscles.  Is it safe to exercise during pregnancy? If you are healthy and your pregnancy is normal, it is safe to continue or start regular physical activity. Physical activity does not increase your risk of miscarriage, low birth weight, or early delivery. It's still important to discuss  exercise with your ob-gyn provider during your early prenatal visits.   What are the benefits of exercise during pregnancy? Regular exercise during pregnancy benefits you and your fetus in these key ways: Reduces back pain Eases constipation May decrease your risk of gestational diabetes, preeclampsia, and cesarean birth Promotes healthy weight gain during pregnancy Improves your overall fitness and strengthens your heart and blood vessels Helps you to lose the baby weight after your baby is born  Is it safe to dye my hair during pregnancy? Yes, it's safe. Only a small amount of chemicals from hair dye is absorbed through the scalp.  Is it safe to keep a cat during pregnancy? Yes, you can keep your cat. You may have heard that cat feces can carry the infection toxoplasmosis. This infection is only found in cats who go outdoors and hunt prey, such as mice and other rodents. If you do have a cat who goes outdoors or eats prey, have someone else take over daily cleaning the litter box. This will keep you away from any cat feces. If you have an indoor cat who only eats cat food and doesn't have contact with outside animals, your risk of toxoplasmosis is very low.  What substances should I avoid during pregnancy? During pregnancy, women should not use tobacco, alcohol, marijuana, illegal drugs, or prescription medications for nonmedical reasons. Avoiding these substances and getting regular prenatal care are important to having a healthy pregnancy and a healthy baby.   What foods do I need to avoid in pregnancy? To help prevent listeriosis, avoid eating the following foods while you are pregnant: Unpasteurized milk and foods made with unpasteurized milk, including soft cheeses Hot dogs and luncheon meats, unless they are heated until steaming hot just before serving Unwashed raw produce such as fruits and vegetables  Avoid all raw and undercooked seafood, eggs, meat, and poultry while you are  pregnant. Do not eat sushi made with raw fish (cooked sushi is safe). Cooking and pasteurization are the only ways to kill Listeria.  Limit your exposure to mercury by not eating bigeye tuna, king mackerel, marlin, orange roughy, shark, swordfish, or tilefish. Limit eating white (albacore) tuna to 6 ounces a week. You do not have to avoid all fish during pregnancy. In fact, fish and shellfish are nutritious foods with vital nutrients for a pregnant woman and her fetus. Be sure to eat at least 8-12 ounces of low-mercury fish and shellfish per week.  Is travel safe to do during pregnancy? In most cases, pregnant women can travel safely until close to their due dates. But travel may not be recommended for women who have pregnancy complications. If you are planning a trip, talk with your (ob-gyn) provider. And no matter how you choose to travel, think ahead about your comfort and safety.  Can I use a sauna or hot tub early in pregnancy? It's best not to. Your core body temperature rises when you use saunas and hot tubs. This rise in temperature can be harmful for your fetus.  Can I get a massage while pregnant? Yes. Massage is a good  way to relax and improve circulation. The best position for a massage while you're pregnant is lying on your side, rather than facedown. Some massage tables have a cut-out for the belly, allowing you to lie facedown comfortably. Tell your massage therapist that you're pregnant if you're not showing yet. Many health spas offer special prenatal massages done by therapists who are trained to work on pregnant women.  Is Having Dental Work While Pregnant Safe? Pregnancy and dental work questions are common for expecting moms. Preventive dental cleanings and annual exams during pregnancy are not only safe but are recommended. The rise in hormone levels during pregnancy causes the gums to swell, bleed, and trap food causing increased irritation to your gums. Preventive dental work  while pregnant is essential to avoid oral infections such as gum disease, which has been linked to preterm birth. The American Dental Association (ADA) recommends pregnant women eat a balanced diet, brush their teeth thoroughly with ADA-approved fluoride toothpaste twice a day, and floss daily. Have preventive exams and cleanings during your pregnancy. Let your dentist know you are pregnant. Postpone non-emergency dental work until the second trimester or after delivery, if possible. Elective procedures should be postponed until after the delivery.  If you haven't already, sign up for My Chart to have easy access to your labs results, and communication with your primary care physician.  You should return to our clinic No follow-ups on file.  I recommend that you always bring your medications to each appointment as this makes it easy to ensure you are on the correct medications and helps [place] not miss refills when you need them.   Please arrive 15 minutes before your appointment to ensure smooth check in process.  We appreciate your efforts in making this happen.  Please call the clinic at [phone] if your symptoms worsen or you have any concerns.  Thank you for allowing me to participate in your care, Wells Guiles, DO 02/13/2022, 8:38 AM PGY-2, [place]

## 2022-02-13 NOTE — Progress Notes (Signed)
  Patient Name: Carolyn Alvarez Date of Birth: 1990-04-07 [place] Prenatal Visit  [name] is a 32 y.o. [id number] at 69w6dhere for routine follow up. She is dated by early ultrasound.  She reports no bleeding, no contractions, and no leaking.  She denies vaginal bleeding.  See flow sheet for details.  Vitals:   02/13/22 0956  BP: 102/70  Pulse: 77   A/P: Pregnancy at 134w6d Doing well.    Routine Prenatal Care:  Dating reviewed, dating tab is correct Fetal heart tones Appropriate, 132, measuring at 21cm Influenza vaccine declined, counseled thoroughly.  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 Anatomy ultrasound ordered to be scheduled at 18-20 weeks. Patient  received  genetic screening (panorama/etc.) which is normal. Pregnancy education including expected weight gain in pregnancy, OTC medication use, continued use of prenatal vitamin, smoking cessation if applicable, and nutrition in pregnancy.   Bleeding and pain precautions reviewed. The patient has the following indications for aspirinto begin 81 mg at 12-16 weeks: One high risk condition: no single high risk condition  MORE than one moderate risk condition: low SES   and identifies as African American  Aspirin was recommended today based upon above risk factors (one high risk condition or more than one moderate risk factor)   2. Pregnancy issues include the following and were addressed as appropriate today:  Anemia with hemoglobin 10.2: Check ferritin, start iron supplement as necessary. History of suicidal ideation: PHQ-9 discussed, question 9 score of 0.  No concerns at this time but will continue to monitor. Problem list and pregnancy box updated: Yes.  She does want to have doula during labor and has requested I be present for delivery if  able. Noted in pregnancy sticky  She does endorse foot pain that is continuous, history of fracture in the past.  Likely worsened leading to her job and weight gain from pregnancy.  Sports medicine referral placed for consideration of orthotics.  Follow up 4 weeks in OB clinic.

## 2022-02-13 NOTE — Assessment & Plan Note (Signed)
Reviewed records, MRI of left foot in 2017 and does follow with podiatry although not seen for 1 year.  Referral to sports medicine for consideration of orthotics given her job and weight gain from pregnancy.

## 2022-02-13 NOTE — Patient Instructions (Addendum)
It was great to see you today! Thank you for choosing [facility] for your obstetric care. Carolyn Alvarez was seen for OB visit.   Baby is doing well, great job!! We will need an anatomy ultrasound of baby in the coming weeks, but we will schedule this. Sometimes mother's need aspirin in pregnancy for various reasons to keep baby safe. I recommend taking aspirin during pregnancy.  Please also take prenatal vitamin.  Will hold off on the iron supplementation as we are checking labs for that today.  Commonly Asked Questions During Pregnancy  How Will I Feel When I'm Pregnant? Pregnancy symptoms in the first trimester of pregnancy may not appear until the middle or end of the second month. Hormonal changes will cause tenderness in your breasts, and you may begin to feel more tired than usual. Food cravings, an increase in the need to urinate, and morning sickness may all be more noticeable.  Pregnancy symptoms in the second trimester are more prominent. You may start to feel the baby move and become more active. Dental issues, nasal/sinus problems, and skin irritations can begin to appear. Heartburn, leg cramps, dizziness, and a vaginal discharge are also common. Every woman is different when it comes to the symptoms they experience, and some may not experience any at all. Pregnancy symptoms in the third trimester can include increased frequency in urination, leg cramps, constipation, ligament pain in the abdomen, and weight gain. Back pain and Braxton Hicks contractions will become increasingly more common.  Why is nutrition during pregnancy important? Eating well is one of the best things you can do during pregnancy. Good nutrition helps you handle the extra demands on your body as your pregnancy progresses. The goal is to balance getting enough nutrients to support the growth of your fetus and maintaining a healthy weight.  How much water should I drink during pregnancy? During  pregnancy you should drink 8 to 12 cups (64 to 96 ounces) of water every day. Water has many benefits. It aids digestion and helps form the amniotic fluid around the fetus. Water also helps nutrients circulate in the body and helps waste leave the body.  What can I do to help with nausea? Eat dry toast or crackers in the morning before you get out of bed to avoid moving around on an empty stomach. Eat five or six "mini meals" a day to ensure that your stomach is never empty. Eat frequent bites of foods like nuts, fruits, or crackers.  What can help with constipation during pregnancy? Constipation is common near the end of pregnancy. Eating more foods with fiber can help fight constipation. Fiber is found in fruits, vegetables, whole grains, beans, nuts, and seeds. You should aim for about 25 grams of fiber in your diet each day. Drink a lot of water as you increase your fiber intake.  How much coffee can I drink while I'm pregnant? Research suggests that moderate caffeine consumption (less than 200 milligrams per day) does not cause miscarriage or preterm birth. That's the amount in one 12-ounce cup of coffee. Remember that caffeine also is found in tea, chocolate, energy drinks, and soft drinks. Caffeine can interfere with sleep and contribute to nausea and light-headedness. Caffeine also can increase urination and lead to dehydration.  What can I do to prevent or ease back pain during pregnancy? There are several things you can do to prevent or ease back pain. For example, wear supportive clothing and shoes. Pay attention to your position  when sitting, sleeping, and lifting things. If you need to stand for a long time, rest one foot on a stool or a box to take the strain off your back. You also can use heat or cold to soothe sore muscles.  Is it safe to exercise during pregnancy? If you are healthy and your pregnancy is normal, it is safe to continue or start regular physical activity. Physical  activity does not increase your risk of miscarriage, low birth weight, or early delivery. It's still important to discuss exercise with your ob-gyn provider during your early prenatal visits.   What are the benefits of exercise during pregnancy? Regular exercise during pregnancy benefits you and your fetus in these key ways: Reduces back pain Eases constipation May decrease your risk of gestational diabetes, preeclampsia, and cesarean birth Promotes healthy weight gain during pregnancy Improves your overall fitness and strengthens your heart and blood vessels Helps you to lose the baby weight after your baby is born  Is it safe to dye my hair during pregnancy? Yes, it's safe. Only a small amount of chemicals from hair dye is absorbed through the scalp.  Is it safe to keep a cat during pregnancy? Yes, you can keep your cat. You may have heard that cat feces can carry the infection toxoplasmosis. This infection is only found in cats who go outdoors and hunt prey, such as mice and other rodents. If you do have a cat who goes outdoors or eats prey, have someone else take over daily cleaning the litter box. This will keep you away from any cat feces. If you have an indoor cat who only eats cat food and doesn't have contact with outside animals, your risk of toxoplasmosis is very low.  What substances should I avoid during pregnancy? During pregnancy, women should not use tobacco, alcohol, marijuana, illegal drugs, or prescription medications for nonmedical reasons. Avoiding these substances and getting regular prenatal care are important to having a healthy pregnancy and a healthy baby.   What foods do I need to avoid in pregnancy? To help prevent listeriosis, avoid eating the following foods while you are pregnant: Unpasteurized milk and foods made with unpasteurized milk, including soft cheeses Hot dogs and luncheon meats, unless they are heated until steaming hot just before serving Unwashed  raw produce such as fruits and vegetables  Avoid all raw and undercooked seafood, eggs, meat, and poultry while you are pregnant. Do not eat sushi made with raw fish (cooked sushi is safe). Cooking and pasteurization are the only ways to kill Listeria.  Limit your exposure to mercury by not eating bigeye tuna, king mackerel, marlin, orange roughy, shark, swordfish, or tilefish. Limit eating white (albacore) tuna to 6 ounces a week. You do not have to avoid all fish during pregnancy. In fact, fish and shellfish are nutritious foods with vital nutrients for a pregnant woman and her fetus. Be sure to eat at least 8-12 ounces of low-mercury fish and shellfish per week.  Is travel safe to do during pregnancy? In most cases, pregnant women can travel safely until close to their due dates. But travel may not be recommended for women who have pregnancy complications. If you are planning a trip, talk with your (ob-gyn) provider. And no matter how you choose to travel, think ahead about your comfort and safety.  Can I use a sauna or hot tub early in pregnancy? It's best not to. Your core body temperature rises when you use saunas and hot tubs. This  rise in temperature can be harmful for your fetus.  Can I get a massage while pregnant? Yes. Massage is a good way to relax and improve circulation. The best position for a massage while you're pregnant is lying on your side, rather than facedown. Some massage tables have a cut-out for the belly, allowing you to lie facedown comfortably. Tell your massage therapist that you're pregnant if you're not showing yet. Many health spas offer special prenatal massages done by therapists who are trained to work on pregnant women.  Is Having Dental Work While Pregnant Safe? Pregnancy and dental work questions are common for expecting moms. Preventive dental cleanings and annual exams during pregnancy are not only safe but are recommended. The rise in hormone levels during  pregnancy causes the gums to swell, bleed, and trap food causing increased irritation to your gums. Preventive dental work while pregnant is essential to avoid oral infections such as gum disease, which has been linked to preterm birth. The American Dental Association (ADA) recommends pregnant women eat a balanced diet, brush their teeth thoroughly with ADA-approved fluoride toothpaste twice a day, and floss daily. Have preventive exams and cleanings during your pregnancy. Let your dentist know you are pregnant. Postpone non-emergency dental work until the second trimester or after delivery, if possible. Elective procedures should be postponed until after the delivery.  If you haven't already, sign up for My Chart to have easy access to your labs results, and communication with your primary care physician.  We are checking some labs today. If they are abnormal, I will call you. If they are normal, I will send you a MyChart message (if it is active) or a letter in the mail. If you do not hear about your labs in the next 2 weeks, please call the office.   You should return to our clinic Return in about 4 weeks (around 03/13/2022) for [facility] faculty.  I recommend that you always bring your medications to each appointment as this makes it easy to ensure you are on the correct medications and helps [place] not miss refills when you need them.   Please arrive 15 minutes before your appointment to ensure smooth check in process.  We appreciate your efforts in making this happen.  Please call the clinic at [phone] if your symptoms worsen or you have any concerns.  Thank you for allowing me to participate in your care, Wells Guiles, DO 02/13/2022, 10:42 AM PGY-2, [place]

## 2022-02-14 LAB — FERRITIN: Ferritin: 32 ng/mL (ref 15–150)

## 2022-02-26 ENCOUNTER — Ambulatory Visit: Payer: Medicaid Other | Attending: Family Medicine

## 2022-02-26 ENCOUNTER — Ambulatory Visit (INDEPENDENT_AMBULATORY_CARE_PROVIDER_SITE_OTHER): Payer: Medicaid Other | Admitting: Family Medicine

## 2022-02-26 ENCOUNTER — Other Ambulatory Visit: Payer: Self-pay

## 2022-02-26 ENCOUNTER — Other Ambulatory Visit (INDEPENDENT_AMBULATORY_CARE_PROVIDER_SITE_OTHER): Payer: Medicaid Other

## 2022-02-26 ENCOUNTER — Telehealth: Payer: Self-pay | Admitting: Student

## 2022-02-26 VITALS — BP 102/64 | HR 85 | Wt 229.4 lb

## 2022-02-26 DIAGNOSIS — O99212 Obesity complicating pregnancy, second trimester: Secondary | ICD-10-CM | POA: Insufficient documentation

## 2022-02-26 DIAGNOSIS — D251 Intramural leiomyoma of uterus: Secondary | ICD-10-CM | POA: Diagnosis not present

## 2022-02-26 DIAGNOSIS — J45909 Unspecified asthma, uncomplicated: Secondary | ICD-10-CM | POA: Diagnosis not present

## 2022-02-26 DIAGNOSIS — O99012 Anemia complicating pregnancy, second trimester: Secondary | ICD-10-CM | POA: Diagnosis not present

## 2022-02-26 DIAGNOSIS — Z362 Encounter for other antenatal screening follow-up: Secondary | ICD-10-CM

## 2022-02-26 DIAGNOSIS — N6452 Nipple discharge: Secondary | ICD-10-CM | POA: Diagnosis not present

## 2022-02-26 DIAGNOSIS — O99810 Abnormal glucose complicating pregnancy: Secondary | ICD-10-CM | POA: Insufficient documentation

## 2022-02-26 DIAGNOSIS — O99512 Diseases of the respiratory system complicating pregnancy, second trimester: Secondary | ICD-10-CM | POA: Diagnosis not present

## 2022-02-26 DIAGNOSIS — Z3A20 20 weeks gestation of pregnancy: Secondary | ICD-10-CM | POA: Diagnosis present

## 2022-02-26 DIAGNOSIS — Z363 Encounter for antenatal screening for malformations: Secondary | ICD-10-CM | POA: Diagnosis not present

## 2022-02-26 DIAGNOSIS — Z3492 Encounter for supervision of normal pregnancy, unspecified, second trimester: Secondary | ICD-10-CM | POA: Insufficient documentation

## 2022-02-26 DIAGNOSIS — Z3A21 21 weeks gestation of pregnancy: Secondary | ICD-10-CM | POA: Diagnosis not present

## 2022-02-26 DIAGNOSIS — R7309 Other abnormal glucose: Secondary | ICD-10-CM

## 2022-02-26 DIAGNOSIS — O3412 Maternal care for benign tumor of corpus uteri, second trimester: Secondary | ICD-10-CM | POA: Insufficient documentation

## 2022-02-26 LAB — POCT CBG (FASTING - GLUCOSE)-MANUAL ENTRY: Glucose Fasting, POC: 116 mg/dL — AB (ref 70–99)

## 2022-02-26 NOTE — Progress Notes (Signed)
    SUBJECTIVE:   CHIEF COMPLAINT / HPI:   Carolyn Alvarez is a 32 y.o. female who presents to the [facility] clinic today to discuss the following concerns:   Skin Concern Patient states that she got her nipples pierced a couple years ago. She had them taken out and then re pierced. She continued to have discomfort so she took them out. She then got them re-pierced and put them "in the right spot". After about 1 year she was still having some discomfort there. She started having significant nipple discomfort on Sunday, 5 days ago. She states when she looked at her nipple she noticed a "big bump" on her right nipple. It "popped" on Tuesday evening (2 days ago). She said it was white fluid and then some blood. This is when she was having significant discomfort.   Her nipple is still tender but no where near as painful as earlier in the week. She is wearing a bandage over the area- states no drainage today but some yesterday. No fevers. She has not taken anything for pain and has been nervous to as she is pregnant and is unsure what is safe.   PERTINENT  PMH / PSH: current pregnancy, depression   OBJECTIVE:   BP 102/64   Pulse 85   Wt 229 lb 6.4 oz (104.1 kg)   LMP 09/27/2021   BMI 43.34 kg/m    General: NAD, pleasant, able to participate in exam Respiratory:normal effort Skin: very small (<0.5 cm) skin opening to lateral right nipple (9 oclock position) that drained a scant amount of white thin fluid, followed by scant bloody fluid. Some discomfort during expression. No streaking erythema or induration  Psych: Normal affect and mood  CMA Erskine Emery present during breast examination   ASSESSMENT/PLAN:   Nipple discharge To lateral nipple. No signs of infection. Drainage seems to have significantly decreased. Pain only during expression. Advised to avoid nipple piercing. Can use warm compresses. Keep area clean and dry. Tylenol as needed for pain. Discussed signs of infection in  which she should seek medical evaluation. No indication for abx at this time, this should continue to improve with conservative care.     Sharion Settler, [place]

## 2022-02-26 NOTE — Assessment & Plan Note (Signed)
To lateral nipple. No signs of infection. Drainage seems to have significantly decreased. Pain only during expression. Advised to avoid nipple piercing. Can use warm compresses. Keep area clean and dry. Tylenol as needed for pain. Discussed signs of infection in which she should seek medical evaluation. No indication for abx at this time, this should continue to improve with conservative care.

## 2022-02-26 NOTE — Patient Instructions (Addendum)
It was wonderful to see you today.  Please bring ALL of your medications with you to every visit.   Today we talked about:  Use warm compresses to your nipple. Do not place piercing's in. Keep the area clean and dry.  Return if you have fever or streaking redness You can use Tylenol for pain control.   Thank you for coming to your visit as scheduled. We have had a large "no-show" problem lately, and this significantly limits our ability to see and care for patients. As a friendly reminder- if you cannot make your appointment please call to cancel. We do have a no show policy for those who do not cancel within 24 hours. Our policy is that if you miss or fail to cancel an appointment within 24 hours, 3 times in a 6-monthperiod, you may be dismissed from our clinic.   Thank you for choosing [facility]   Please call [phone] any questions about today's appointment.  Please be sure to schedule follow up at the front  desk before you leave today.   ASharion Settler DO PGY-3 Family Medicine

## 2022-02-26 NOTE — Telephone Encounter (Signed)
Clinical info completed on work accomendation form.  Placed form in Dr Philbert Riser box for completion.    When form is completed, please route note to "RN Team" and place in wall pocket in front office.   Ottis Stain, CMA

## 2022-02-26 NOTE — Telephone Encounter (Signed)
Patient dropped off work accommodations form to be completed. Last DOS was 02/26/22. Placed in [employer]

## 2022-02-27 ENCOUNTER — Other Ambulatory Visit: Payer: Self-pay | Admitting: Student

## 2022-02-27 ENCOUNTER — Telehealth: Payer: Self-pay

## 2022-02-27 DIAGNOSIS — Z3A20 20 weeks gestation of pregnancy: Secondary | ICD-10-CM

## 2022-02-27 LAB — GESTATIONAL GLUCOSE TOLERANCE
Glucose, Fasting: 101 mg/dL — ABNORMAL HIGH (ref 70–94)
Glucose, GTT - 1 Hour: 188 mg/dL — ABNORMAL HIGH (ref 70–179)
Glucose, GTT - 2 Hour: 131 mg/dL (ref 70–154)
Glucose, GTT - 3 Hour: 100 mg/dL (ref 70–139)

## 2022-02-27 NOTE — Telephone Encounter (Signed)
Patient returned call to nurse line. Verified name and DOB. Patient reports that she was returning call to Dr. Zigmund Daniel regarding results.   Advised of results and informed the referral had been placed to [facility] office.  Patient has no further questions at this time.   Talbot Grumbling, RN

## 2022-03-02 NOTE — Telephone Encounter (Signed)
Patient called and informed that forms are ready for pick up. Copy made and placed in batch scanning. Original placed at front desk for pick up.   Derrisha Foos C Normon Pettijohn, RN  

## 2022-03-12 ENCOUNTER — Ambulatory Visit (INDEPENDENT_AMBULATORY_CARE_PROVIDER_SITE_OTHER): Payer: Medicaid Other | Admitting: Family Medicine

## 2022-03-12 ENCOUNTER — Other Ambulatory Visit: Payer: Self-pay

## 2022-03-12 VITALS — BP 120/60 | HR 83 | Wt 230.8 lb

## 2022-03-12 DIAGNOSIS — D509 Iron deficiency anemia, unspecified: Secondary | ICD-10-CM | POA: Diagnosis not present

## 2022-03-12 DIAGNOSIS — O24419 Gestational diabetes mellitus in pregnancy, unspecified control: Secondary | ICD-10-CM | POA: Diagnosis not present

## 2022-03-12 DIAGNOSIS — Z3201 Encounter for pregnancy test, result positive: Secondary | ICD-10-CM | POA: Diagnosis not present

## 2022-03-12 DIAGNOSIS — Z3A23 23 weeks gestation of pregnancy: Secondary | ICD-10-CM | POA: Diagnosis not present

## 2022-03-12 DIAGNOSIS — O99012 Anemia complicating pregnancy, second trimester: Secondary | ICD-10-CM | POA: Diagnosis not present

## 2022-03-12 LAB — POCT HEMOGLOBIN: Hemoglobin: 9.1 g/dL — AB (ref 11–14.6)

## 2022-03-12 LAB — GLUCOSE, POCT (MANUAL RESULT ENTRY): POC Glucose: 162 mg/dl — AB (ref 70–99)

## 2022-03-12 MED ORDER — PRENATAL VITAMINS 28-0.8 MG PO TABS: ORAL_TABLET | ORAL | 8 refills | Status: DC

## 2022-03-12 MED ORDER — BLOOD GLUCOSE MONITOR KIT: PACK | 0 refills | Status: DC

## 2022-03-12 MED ORDER — FERROUS SULFATE 325 (65 FE) MG PO TABS: 325.0000 mg | ORAL_TABLET | ORAL | 1 refills | Status: DC

## 2022-03-12 NOTE — Progress Notes (Signed)
[place] Craigie is a 32 y.o. [phone] at [redacted]w[redacted]d(via LMP c/w 7 wk sono) [facility] Prenatal course, history, notes, ultrasounds, and laboratory results reviewed.  Denies cramping/ctx, fluid leaking, vaginal bleeding, or decreased fetal movement. Taking PNV and aspirin. Needs refill of PNV.    Primary Prenatal Care Provider: Dr DMadison Hickman Postpartum Plans: - delivery planning: SVD - feeding: breast - [facility]- contraception: declines- previous adverse experience with Nexplanon  FHR: 156 bpm Uterine size: 25 cm  Assessment & Plan  1. Routine prenatal care: - LR NIPT, DOES NOT WANT TO KNOW GENDER - declined flu and COVID vaccines today  2. Gestational Diabetes vs pre-existing diabetes- elevated 3 hr GTT at 22 WGA. POC BG 1 hr after eating here was 162. Discussed dietary changes as she does not like to take medications if not needed. Ordered glucometer/lancets/strips and discussed checking BG fasting and 2 hr PP. Appt with OB schedule next week 03/19/22- she plans to call to try and combine her [place]ore[place]ith them as well. Discussed importance of close follow up. Has f/u growth UKoreascheduled with OB on 03/27/22.  3. Anemia of pregnancy- has been taking PNV with iron. POC Hgb 9.1 today, discussed starting PO ferrous sulfate every other day. Recheck CBC in 4-6 weeks, consider IV iron at that time.  4. Asthma- well controlled, only using albuterol occassionally, discussed signs of poor control and to give [place] call if her asthma getting worse. Declined flu/COVID vaccines today.   5. Nipple discharge- resolved and none since last appt. 6. H/o gestational HTN- notes a lot of swelling and weight gain last week of her first pregnancy? Taking baby aspirin, asymptomatic today. 7. History of shoulder dystocia- in first pregnancy, does not remember how long or what was done to resolve it.   8. Absent nasal bone- with LR NIPT. Per MFM no further testing recommended.  Next prenatal visit in 1 weeks with high risk OB. Labor & fetal movement precautions discussed.  MYehuda Savannah MD [facility]

## 2022-03-12 NOTE — Patient Instructions (Addendum)
You have a follow up appointment on Mar 19 2022 at 11:15 AM with the OB doctor at the [facility]- it is important to go to this appointment!  [phone] if you need to reschedule.  We sent you in a glucometer to check your blood sugars. Please start checking fasting and 2 hours after you eat and write these down. Our goal blood glucose:  Fasting < 90  2 hours after eating < 120  You have your next ultrasound scheduled on 03/27/22 at 3:15 PM.  Prenatal Classes Go to [email or web address] for more information on the pregnancy and child birth classes that [place] has to offer.   Pregnancy Related Return Precautions The follow are signs/symptoms that are abnormal in pregnancy and may require further evaluation by a physician: Go to the MAU at [place] at [facility] if: You have cramping/contractions that do not go away with drinking water, especially if they are lasting 30 seconds to 1.5 minutes, coming and going every 5-10 minutes for an hour or more, or are getting stronger and you cannot walk or talk while having a contraction/cramp. Your water breaks.  Sometimes it is a big gush of fluid, sometimes it is just a trickle that keeps getting your underwear wet or running down your legs You have vaginal bleeding.    You do not feel your baby moving like normal.  If you do not, get something to eat and drink (something cold or something with sugar like peanut butter or juice) and lay down and focus on feeling your baby move. If your baby is still not moving like normal, you should go to MAU. You should feel your baby move 6 times in one hour, or 10 times in two hours. You have a persistent headache that does not go away with 1 g of Tylenol, vision changes, chest pain, difficulty breathing, severe pain in your right upper abdomen, worsening leg swelling- these can all be signs of high blood pressure in pregnancy and need to be evaluated  by a provider immediately  These are all concerning in pregnancy and if you have any of these I recommend you call your PCP and present to the Maternity Admissions Unit (map below) for further evaluation.  For any pregnancy-related emergencies, please go to the Maternity Admissions Unit in the [facility] will use hospital Entrance C.    Our clinic number is [phone].   Dr Thompson Grayer

## 2022-03-17 ENCOUNTER — Ambulatory Visit: Payer: Medicaid Other | Admitting: Registered"

## 2022-03-19 ENCOUNTER — Other Ambulatory Visit: Payer: Medicaid Other

## 2022-03-24 ENCOUNTER — Ambulatory Visit (INDEPENDENT_AMBULATORY_CARE_PROVIDER_SITE_OTHER): Payer: Medicaid Other | Admitting: Registered"

## 2022-03-24 ENCOUNTER — Other Ambulatory Visit: Payer: Self-pay | Admitting: *Deleted

## 2022-03-24 ENCOUNTER — Encounter: Payer: Medicaid Other | Attending: Family Medicine | Admitting: Registered"

## 2022-03-24 DIAGNOSIS — O24415 Gestational diabetes mellitus in pregnancy, controlled by oral hypoglycemic drugs: Secondary | ICD-10-CM | POA: Insufficient documentation

## 2022-03-24 DIAGNOSIS — Z3A28 28 weeks gestation of pregnancy: Secondary | ICD-10-CM | POA: Insufficient documentation

## 2022-03-24 DIAGNOSIS — O24410 Gestational diabetes mellitus in pregnancy, diet controlled: Secondary | ICD-10-CM | POA: Diagnosis not present

## 2022-03-24 DIAGNOSIS — Z3A25 25 weeks gestation of pregnancy: Secondary | ICD-10-CM

## 2022-03-24 DIAGNOSIS — Z713 Dietary counseling and surveillance: Secondary | ICD-10-CM | POA: Insufficient documentation

## 2022-03-24 DIAGNOSIS — Z8632 Personal history of gestational diabetes: Secondary | ICD-10-CM | POA: Insufficient documentation

## 2022-03-24 DIAGNOSIS — O24419 Gestational diabetes mellitus in pregnancy, unspecified control: Secondary | ICD-10-CM | POA: Insufficient documentation

## 2022-03-24 NOTE — Progress Notes (Signed)
Received request from diabetes educator for order/ referral to be placed. Order placed per protocol. Advised educator pt has been referred to [facility] not [facility].

## 2022-03-24 NOTE — Progress Notes (Signed)
Patient was seen for Gestational Diabetes self-management on 03/24/22  Start time 0830 and End time 0915   Patient was referred from [facility] to be [facility] at [facility] [employer] [facility]. Pt states she thought today's visit was for her transfer of care. Msg sent to [facility] location via [place] asking for someone to contact her.   Estimated due date: 07/04/22; [redacted]w[redacted]d Clinical: Medications: reviewed Medical History: GDM, needle phobia, anemia, anaphylactic shock d/t food rxn, citrus: itching, rash Labs: OGTT 101(H)-188(H)-131-100, A1c 5.4% on 10/08/21  Dietary and Lifestyle History: Pt reports family history of T2D. Pt states about 2 years ago she had made changes to diet and was going to the gym regularly to maintain good health.  Pt states what she had struggled with the most was balancing out starchy and non-starchy vegetables.   Physical Activity: ADL Stress: not assessed Sleep: not assessed  24 hr Recall: not assessed First Meal:  Snack: Second meal: Snack: Third meal: Snack: Beverages:  NUTRITION INTERVENTION  Nutrition education (E-1) on the following topics:   Initial Follow-up  [x]$  []$  Definition of Gestational Diabetes [x]$  []$  Why dietary management is important in controlling blood glucose [x]$  []$  Effects each nutrient has on blood glucose levels []$  []$  Simple carbohydrates vs complex carbohydrates []$  []$  Fluid intake []$  []$  Creating a balanced meal plan [x]$  []$  Carbohydrate counting  [x]$  []$  When to check blood glucose levels [x]$  []$  Proper blood glucose monitoring techniques [x]$  []$  Effect of stress and stress reduction techniques  [x]$  []$  Exercise effect on blood glucose levels, appropriate exercise during pregnancy []$  []$  Importance of limiting caffeine and abstaining from alcohol and smoking [x]$  []$  Medications used for blood sugar control during pregnancy []$  []$  Hypoglycemia and rule of 15 []$  []$  Postpartum self care  Blood glucose monitor  given: Accu-chek Guide Me Lot #[id number]: 2022-12-22 CBG: -- mg/dL error - not enough blood. Pt has needle phobia and after pricking 3 times decided not to do a 4th prick. Pt demonstrated understanding of SMBG technique.  Patient instructed to monitor glucose levels: FBS: 60 - ? 95 mg/dL; 2 hour: ? 120 mg/dL  Patient received handouts: Nutrition Diabetes and Pregnancy Carbohydrate Counting List  Patient will be seen for follow-up in 2 weeks or as needed.

## 2022-03-27 ENCOUNTER — Ambulatory Visit: Payer: Medicaid Other | Admitting: *Deleted

## 2022-03-27 ENCOUNTER — Ambulatory Visit: Payer: Medicaid Other | Attending: Obstetrics and Gynecology

## 2022-03-27 VITALS — BP 112/60 | HR 80

## 2022-03-27 DIAGNOSIS — O35AXX0 Maternal care for other (suspected) fetal abnormality and damage, fetal facial anomalies, not applicable or unspecified: Secondary | ICD-10-CM

## 2022-03-27 DIAGNOSIS — Q758 Other specified congenital malformations of skull and face bones: Secondary | ICD-10-CM | POA: Insufficient documentation

## 2022-03-27 DIAGNOSIS — Z362 Encounter for other antenatal screening follow-up: Secondary | ICD-10-CM | POA: Diagnosis present

## 2022-03-27 DIAGNOSIS — O99012 Anemia complicating pregnancy, second trimester: Secondary | ICD-10-CM

## 2022-03-27 DIAGNOSIS — R7309 Other abnormal glucose: Secondary | ICD-10-CM | POA: Diagnosis present

## 2022-03-27 DIAGNOSIS — Z3A25 25 weeks gestation of pregnancy: Secondary | ICD-10-CM

## 2022-03-27 DIAGNOSIS — F32A Depression, unspecified: Secondary | ICD-10-CM

## 2022-03-27 DIAGNOSIS — J45909 Unspecified asthma, uncomplicated: Secondary | ICD-10-CM

## 2022-03-27 DIAGNOSIS — O99212 Obesity complicating pregnancy, second trimester: Secondary | ICD-10-CM | POA: Diagnosis present

## 2022-03-27 DIAGNOSIS — O361920 Maternal care for other isoimmunization, second trimester, not applicable or unspecified: Secondary | ICD-10-CM

## 2022-03-27 DIAGNOSIS — O99342 Other mental disorders complicating pregnancy, second trimester: Secondary | ICD-10-CM

## 2022-03-27 DIAGNOSIS — O24420 Gestational diabetes mellitus in childbirth, diet controlled: Secondary | ICD-10-CM

## 2022-03-27 DIAGNOSIS — O99512 Diseases of the respiratory system complicating pregnancy, second trimester: Secondary | ICD-10-CM

## 2022-03-27 DIAGNOSIS — E669 Obesity, unspecified: Secondary | ICD-10-CM

## 2022-03-27 DIAGNOSIS — D509 Iron deficiency anemia, unspecified: Secondary | ICD-10-CM

## 2022-03-30 ENCOUNTER — Other Ambulatory Visit: Payer: Self-pay | Admitting: *Deleted

## 2022-03-30 DIAGNOSIS — O99212 Obesity complicating pregnancy, second trimester: Secondary | ICD-10-CM

## 2022-03-30 DIAGNOSIS — Q758 Other specified congenital malformations of skull and face bones: Secondary | ICD-10-CM

## 2022-03-30 DIAGNOSIS — O99012 Anemia complicating pregnancy, second trimester: Secondary | ICD-10-CM

## 2022-03-30 DIAGNOSIS — O24410 Gestational diabetes mellitus in pregnancy, diet controlled: Secondary | ICD-10-CM

## 2022-04-07 ENCOUNTER — Other Ambulatory Visit: Payer: Medicaid Other

## 2022-04-14 ENCOUNTER — Encounter: Payer: Self-pay | Admitting: Obstetrics & Gynecology

## 2022-04-14 ENCOUNTER — Ambulatory Visit (INDEPENDENT_AMBULATORY_CARE_PROVIDER_SITE_OTHER): Payer: Medicaid Other | Admitting: Obstetrics & Gynecology

## 2022-04-14 VITALS — BP 114/80 | HR 79 | Wt 234.4 lb

## 2022-04-14 DIAGNOSIS — O99513 Diseases of the respiratory system complicating pregnancy, third trimester: Secondary | ICD-10-CM

## 2022-04-14 DIAGNOSIS — Z8759 Personal history of other complications of pregnancy, childbirth and the puerperium: Secondary | ICD-10-CM | POA: Insufficient documentation

## 2022-04-14 DIAGNOSIS — O0993 Supervision of high risk pregnancy, unspecified, third trimester: Secondary | ICD-10-CM

## 2022-04-14 DIAGNOSIS — D509 Iron deficiency anemia, unspecified: Secondary | ICD-10-CM

## 2022-04-14 DIAGNOSIS — O24419 Gestational diabetes mellitus in pregnancy, unspecified control: Secondary | ICD-10-CM | POA: Diagnosis not present

## 2022-04-14 DIAGNOSIS — O99013 Anemia complicating pregnancy, third trimester: Secondary | ICD-10-CM

## 2022-04-14 DIAGNOSIS — O99213 Obesity complicating pregnancy, third trimester: Secondary | ICD-10-CM

## 2022-04-14 DIAGNOSIS — Z3A28 28 weeks gestation of pregnancy: Secondary | ICD-10-CM

## 2022-04-14 DIAGNOSIS — J45909 Unspecified asthma, uncomplicated: Secondary | ICD-10-CM | POA: Insufficient documentation

## 2022-04-14 DIAGNOSIS — O365930 Maternal care for other known or suspected poor fetal growth, third trimester, not applicable or unspecified: Secondary | ICD-10-CM

## 2022-04-14 HISTORY — DX: Personal history of other complications of pregnancy, childbirth and the puerperium: Z87.59

## 2022-04-14 MED ORDER — FLUTICASONE PROPIONATE HFA 220 MCG/ACT IN AERO: 2.0000 | INHALATION_SPRAY | Freq: Two times a day (BID) | RESPIRATORY_TRACT | 12 refills | Status: DC

## 2022-04-14 MED ORDER — MONTELUKAST SODIUM 10 MG PO TABS: 10.0000 mg | ORAL_TABLET | Freq: Every day | ORAL | 1 refills | Status: AC

## 2022-04-14 NOTE — Patient Instructions (Signed)
Return to office for any scheduled appointments. Call the office or go to the MAU at [place] at [facility] if: You begin to have strong, frequent contractions Your water breaks.  Sometimes it is a big gush of fluid, sometimes it is just a trickle that keeps getting your underwear wet or running down your legs You have vaginal bleeding.  It is normal to have a small amount of spotting if your cervix was checked.  You do not feel your baby moving like normal.  If you do not, get something to eat and drink and lay down and focus on feeling your baby move.   If your baby is still not moving like normal, you should call the office or go to MAU. Any other obstetric concerns.  TDaP Vaccine Pregnancy Get the Whooping Cough Vaccine While You Are Pregnant (CDC)  It is important for women to get the whooping cough vaccine in the third trimester of each pregnancy. Vaccines are the best way to prevent this disease. There are 2 different whooping cough vaccines. Both vaccines combine protection against whooping cough, tetanus and diphtheria, but they are for different age groups: Tdap: for everyone 11 years or older, including pregnant women  DTaP: for children 2 months through 39 years of age  You need the whooping cough vaccine during each of your pregnancies The recommended time to get the shot is during your 27th through 36th week of pregnancy, preferably during the earlier part of this time period. The Centers for Disease Control and Prevention (CDC) recommends that pregnant women receive the whooping cough vaccine for adolescents and adults (called Tdap vaccine) during the third trimester of each pregnancy. The recommended time to get the shot is during your 27th through 36th week of pregnancy, preferably during the earlier part of this time period. This replaces the original recommendation that pregnant women get the vaccine only if they had not previously received it. The [employer] of Obstetricians and Gynecologists and the [employer] support this recommendation.  You should get the whooping cough vaccine while pregnant to pass protection to your baby frame support disabled and/or not supported in this browser  Learn why Mickel Baas decided to get the whooping cough vaccine in her 3rd trimester of pregnancy and how her baby girl was born with some protection against the disease. Also available on YouTube. After receiving the whooping cough vaccine, your body will create protective antibodies (proteins produced by the body to fight off diseases) and pass some of them to your baby before birth. These antibodies provide your baby some short-term protection against whooping cough in early life. These antibodies can also protect your baby from some of the more serious complications that come along with whooping cough. Your protective antibodies are at their highest about 2 weeks after getting the vaccine, but it takes time to pass them to your baby. So the preferred time to get the whooping cough vaccine is early in your third trimester. The amount of whooping cough antibodies in your body decreases over time. That is why CDC recommends you get a whooping cough vaccine during each pregnancy. Doing so allows each of your babies to get the greatest number of protective antibodies from you. This means each of your babies will get the best protection possible against this disease.  Getting the whooping cough vaccine while pregnant is better than getting the vaccine after you give birth Whooping cough vaccination during pregnancy is ideal so your  baby will have short-term protection as soon as he is born. This early protection is important because your baby will not start getting his whooping cough vaccines until he is 2 months old. These first few months of life are when your baby is at greatest risk for catching whooping cough. This is also when he's at greatest  risk for having severe, potentially life-threating complications from the infection. To avoid that gap in protection, it is best to get a whooping cough vaccine during pregnancy. You will then pass protection to your baby before he is born. To continue protecting your baby, he should get whooping cough vaccines starting at 2 months old. You may never have gotten the Tdap vaccine before and did not get it during this pregnancy. If so, you should make sure to get the vaccine immediately after you give birth, before leaving the hospital or birthing center. It will take about 2 weeks before your body develops protection (antibodies) in response to the vaccine. Once you have protection from the vaccine, you are less likely to give whooping cough to your newborn while caring for him. But remember, your baby will still be at risk for catching whooping cough from others. A recent study looked to see how effective Tdap was at preventing whooping cough in babies whose mothers got the vaccine while pregnant or in the hospital after giving birth. The study found that getting Tdap between 27 through 36 weeks of pregnancy is 85% more effective at preventing whooping cough in babies younger than 2 months old. Blood tests cannot tell if you need a whooping cough vaccine There are no blood tests that can tell you if you have enough antibodies in your body to protect yourself or your baby against whooping cough. Even if you have been sick with whooping cough in the past or previously received the vaccine, you still should get the vaccine during each pregnancy. Breastfeeding may pass some protective antibodies onto your baby By breastfeeding, you may pass some antibodies you have made in response to the vaccine to your baby. When you get a whooping cough vaccine during your pregnancy, you will have antibodies in your breast milk that you can share with your baby as soon as your milk comes in. However, your baby will not get  protective antibodies immediately if you wait to get the whooping cough vaccine until after delivering your baby. This is because it takes about 2 weeks for your body to create antibodies. Learn more about the health benefits of breastfeeding.

## 2022-04-14 NOTE — Progress Notes (Signed)
PRENATAL VISIT NOTE  Subjective:  Carolyn Alvarez is a 32 y.o. G5P1031 at 52w3dbeing seen today [facility]  She is currently monitored for the following issues for this high-risk pregnancy and has Depression, recurrent ([place]; ECZEMA, ATOPIC; Anemia during pregnancy in third trimester; History of gonorrhea; Suicidal ideation; Mild persistent asthma without complication; Seasonal and perennial allergic rhinitis; Left foot pain; Supervision of high-risk pregnancy; Nipple discharge; Gestational diabetes mellitus (GDM), antepartum; History of gestational hypertension; Maternal morbid obesity, antepartum ([place]; Asthma complicating pregnancy in third trimester; and Poor fetal growth affecting management of mother in third trimester on their problem list.  Patient reports she has noticed worsening asthma symptoms. Has to use her albuterol inhaler more recently. No respiratory distress. Contractions: Not present. Vag. Bleeding: None.  Movement: Present. Denies leaking of fluid.   The following portions of the patient's history were reviewed and updated as appropriate: allergies, current medications, past family history, past medical history, past social history, past surgical history and problem list.   Objective:   Vitals:   04/14/22 1015  BP: 114/80  Pulse: 79  Weight: 234 lb 6.4 oz (106.3 kg)    Fetal Status: Fetal Heart Rate (bpm): 160   Movement: Present     General:  Alert, oriented and cooperative. Patient is in no acute distress.  Skin: Skin is warm and dry. No rash noted.   Cardiovascular: Normal heart rate noted  Respiratory: Normal respiratory effort, no problems with respiration noted  Abdomen: Soft, gravid, appropriate for gestational age.  Pain/Pressure: Absent     Pelvic: Cervical exam deferred        Extremities: Normal range of motion.  Edema: None  Mental Status: Normal mood and affect. Normal behavior. Normal judgment and thought content.    Imaging: [place]FM OB FOLLOW UP  Result Date: 03/27/2022 ----------------------------------------------------------------------  OBSTETRICS REPORT                       (Signed Final 03/27/2022 04:52 pm) ---------------------------------------------------------------------- Patient Info  ID #:       [id number]                         D.O.B.:  1992(31 yrs)  Name:       Carolyn Alvarez                     Visit Date: 03/27/2022 03:47 pm              [place] ---------------------------------------------------------------------- Performed By  Attending:        VJohnell ComingsMD         Ref. Address:     [address] Performed By:     CRosana Hoes         [facility]         [facility]                                                             [place] [facility]                                                             [facility]  Referred By:      [facility] ---------------------------------------------------------------------- Orders  #  Description                           Code        Ordered By  1  [place] MFM OB FOLLOW UP                   [phone]    RAVI [facility] ----------------------------------------------------------------------  #  Order #                     Accession #                Episode #  1  [id number]                   [id number]                 [id number] ---------------------------------------------------------------------- Indications  Obesity complicating pregnancy, second         O99.212  trimester  Anemia during pregnancy in second trimester    [id number]  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 Cord Insertion:      Previously visualized  Amniotic Fluid  AFI FV:      Within normal limits ---------------------------------------------------------------------- Biometry  BPD:      60.5  mm     G. Age:  24w 4d  8  %    CI:        71.52   %    70 - 86                                                          FL/HC:      17.6   %    18.6 - 20.4  HC:      227.8  mm     G. Age:  24w 6d        4.7  %    HC/AC:      1.04        1.04 - 1.22  AC:      219.5  mm     G. Age:  26w 3d         62  %    FL/BPD:     66.4   %    71 - 87  FL:       40.2  mm     G. Age:  23w 0d        < 1  %    FL/AC:      18.3   %    20 - 24  Est. FW:     752  gm    1 lb 11 oz      11  % ---------------------------------------------------------------------- OB History  Blood Type:   A+  Gravidity:    5         Term:   1  TOP:          3        Living:  1 ---------------------------------------------------------------------- Gestational Age  LMP:           25w 6d        Date:  09/27/21                  EDD:   07/04/22  U/S Today:     24w 5d                                        EDD:   07/12/22  Best:          25w 6d     Det. By:  LMP  (09/27/21)          EDD:   07/04/22 ---------------------------------------------------------------------- Anatomy  Cranium:               Previously seen        LVOT:                   Previously seen  Cavum:                 Previously             Aortic Arch:            Previously seen                         visualized  Ventricles:            Appears normal         Ductal Arch:  Not well visualized  Choroid Plexus:         Previously seen        Diaphragm:              Previously seen  Cerebellum:            Previously seen        Stomach:                Appears normal, left                                                                        sided  Posterior Fossa:       Previously seen        Abdomen:                Previously seen  Nuchal Fold:           Previously seen        Abdominal Wall:         Previously seen  Face:                  Absent nasal bone      Cord Vessels:           Previously seen  Lips:                  Previously seen        Kidneys:                Appear normal  Palate:                Previously             Bladder:                Appears normal                         visualized  Thoracic:              Previously seen        Spine:                  Previously seen  Heart:                 Previously seen        Upper Extremities:      Previously seen  RVOT:                  Previously seen        Lower Extremities:      Previously seen  Other:  Fetus appears to be a female. Lenses, maxilla, mandible, falx,          Heels/feet and open hands/5th digits, VC, 3VV, and 3VTV Previously          visualized. Technically difficult due to maternal habitus and fetal          position. ---------------------------------------------------------------------- Cervix Uterus Adnexa  Adnexa  No abnormality visualized ---------------------------------------------------------------------- Comments  This patient was seen for a follow up growth scan due to diet-  controlled gestational diabetes and maternal obesity with a  BMI of 41.  She  denies any problems since her last exam.  She was informed that the fetal growth and amniotic fluid  level appears appropriate for her gestational age.  The overall  EFW measured at the 11th percentile for her gestational age.  The femur length remains short.  Doppler studies of the umbilical arteries performed today  shows continued normal forward flow.  There were no signs  of absent or  reversed end-diastolic flow.  A follow up exam was scheduled in 4 weeks. ----------------------------------------------------------------------                   Johnell Comings, MD Electronically Signed Final Report   03/27/2022 04:52 pm ----------------------------------------------------------------------   Assessment and Plan:  Pregnancy: [id number] at 22w3d1. Gestational diabetes mellitus (GDM), antepartum, gestational diabetes method of control unspecified Did not bring log but reports fastings 97-115 and PP 150-170.  Patient was told all values are abnormal, medication recommended. She feels she has not optimized her diet, wants to try for one more week and then will consider Metformin if not controlled. She will send her BS via MyChart next week for evaluation.  Continue close monitoring. A1C checked with labs today. - Hemoglobin A1c  2. Maternal morbid obesity, antepartum (HCC) TWG 19 lbs, Body mass index is 44.29 kg/m. Getting serial growth scans as per MFM, will get weekly testing antenatal later this pregnancy.  3. Poor fetal growth affecting management of mother in third trimester At 26 weeks, EFW 11%. Normal AFV and normal dopplers. Follow up scan on 04/24/22, will follow up results and manage accordingly.  4. Asthma complicating pregnancy in third trimester Increased dosage of Flovent HFA from 110 mcg to 220 mcg and added Singulair. Will monitor response.  Precautions reviewed with patient. - fluticasone (FLOVENT HFA) 220 MCG/ACT inhaler; Inhale 2 puffs into the lungs 2 (two) times daily.  Dispense: 1 each; Refill: 12 - montelukast (SINGULAIR) 10 MG tablet; Take 1 tablet (10 mg total) by mouth at bedtime.  Dispense: 30 tablet; Refill: 1  5. Anemia during pregnancy in third trimester 03/12/22 Hgb was 9.1, on oral iron therapy. Will check anemia profile, may need IV iron. - Anemia Profile B  6. History of gestational hypertension Stable BP, continue ASA.  7. [redacted] weeks gestation of  pregnancy 8. Supervision of high risk pregnancy in third trimester Third trimester labs done today, will follow up results and manage accordingly. Declined Tdap for now. - Comprehensive metabolic panel - RPR - HIV Antibody (routine testing w rflx)  The nature [facility] with multiple MDs and other Advanced Practice Providers was explained to patient; also emphasized that residents, students are part of our team. Preterm labor symptoms and general obstetric precautions including but not limited to vaginal bleeding, contractions, leaking of fluid and fetal movement were reviewed in detail with the patient. Please refer to After Visit Summary for other counseling recommendations.   Return in about 2 weeks (around 04/28/2022) for OFFICE OB VISIT (MD only).  Future Appointments  Date Time Provider [place] 04/24/2022  1:30 [facility] 04/24/2022  1:45 PM [facility] US4 [facility] 05/01/2022  8:35 AM NCaren Macadam MD [facility]  05/15/2022 10:35 AM NCaren Macadam MD [facility]  05/29/2022 10:55 AM NCaren Macadam MD [facility]    UVerita Schneiders MD

## 2022-04-15 ENCOUNTER — Encounter: Payer: Self-pay | Admitting: Obstetrics & Gynecology

## 2022-04-15 LAB — ANEMIA PROFILE B
Basophils Absolute: 0 10*3/uL (ref 0.0–0.2)
Basos: 0 %
EOS (ABSOLUTE): 0.1 10*3/uL (ref 0.0–0.4)
Eos: 1 %
Ferritin: 20 ng/mL (ref 15–150)
Folate: 5.1 ng/mL (ref >3.0)
Hematocrit: 28.7 % — ABNORMAL LOW (ref 34.0–46.6)
Hemoglobin: 9.3 g/dL — ABNORMAL LOW (ref 11.1–15.9)
Immature Grans (Abs): 0 10*3/uL (ref 0.0–0.1)
Immature Granulocytes: 0 %
Iron Saturation: 11 % — ABNORMAL LOW (ref 15–55)
Iron: 49 ug/dL (ref 27–159)
Lymphocytes Absolute: 1.7 10*3/uL (ref 0.7–3.1)
Lymphs: 22 %
MCH: 29.2 pg (ref 26.6–33.0)
MCHC: 32.4 g/dL (ref 31.5–35.7)
MCV: 90 fL (ref 79–97)
Monocytes Absolute: 0.5 10*3/uL (ref 0.1–0.9)
Monocytes: 7 %
Neutrophils Absolute: 5.5 10*3/uL (ref 1.4–7.0)
Neutrophils: 70 %
Platelets: 387 10*3/uL (ref 150–450)
RBC: 3.19 x10E6/uL — ABNORMAL LOW (ref 3.77–5.28)
RDW: 12.6 % (ref 11.7–15.4)
Retic Ct Pct: 3.2 % — ABNORMAL HIGH (ref 0.6–2.6)
Total Iron Binding Capacity: 438 ug/dL (ref 250–450)
UIBC: 389 ug/dL (ref 131–425)
Vitamin B-12: 390 pg/mL (ref 232–1245)
WBC: 7.8 10*3/uL (ref 3.4–10.8)

## 2022-04-15 LAB — COMPREHENSIVE METABOLIC PANEL
ALT: 15 IU/L (ref 0–32)
AST: 19 IU/L (ref 0–40)
Albumin/Globulin Ratio: 0.9 — ABNORMAL LOW (ref 1.2–2.2)
Albumin: 3.3 g/dL — ABNORMAL LOW (ref 3.9–4.9)
Alkaline Phosphatase: 116 IU/L (ref 44–121)
BUN/Creatinine Ratio: 11 (ref 9–23)
BUN: 6 mg/dL (ref 6–20)
Bilirubin Total: 0.2 mg/dL (ref 0.0–1.2)
CO2: 22 mmol/L (ref 20–29)
Calcium: 8.6 mg/dL — ABNORMAL LOW (ref 8.7–10.2)
Chloride: 100 mmol/L (ref 96–106)
Creatinine, Ser: 0.57 mg/dL (ref 0.57–1.00)
Globulin, Total: 3.5 g/dL (ref 1.5–4.5)
Glucose: 118 mg/dL — ABNORMAL HIGH (ref 70–99)
Potassium: 4.3 mmol/L (ref 3.5–5.2)
Sodium: 134 mmol/L (ref 134–144)
Total Protein: 6.8 g/dL (ref 6.0–8.5)
eGFR: 123 mL/min/{1.73_m2} (ref >59)

## 2022-04-15 LAB — HEMOGLOBIN A1C
Est. average glucose Bld gHb Est-mCnc: 120 mg/dL
Hgb A1c MFr Bld: 5.8 % — ABNORMAL HIGH (ref 4.8–5.6)

## 2022-04-15 LAB — RPR: RPR Ser Ql: NONREACTIVE

## 2022-04-15 LAB — HIV ANTIBODY (ROUTINE TESTING W REFLEX): HIV Screen 4th Generation wRfx: NONREACTIVE

## 2022-04-15 MED ORDER — FERRIC MALTOL 30 MG PO CAPS: 1.0000 | ORAL_CAPSULE | Freq: Two times a day (BID) | ORAL | 2 refills | Status: DC

## 2022-04-15 NOTE — Addendum Note (Signed)
Addended by: Verita Schneiders A on: 04/15/2022 11:37 AM   Modules accepted: Orders

## 2022-04-16 ENCOUNTER — Telehealth: Payer: Self-pay | Admitting: *Deleted

## 2022-04-16 NOTE — Telephone Encounter (Signed)
Attempted to call pt to discuss results and next steps, no answer and no voicemail set up

## 2022-04-24 ENCOUNTER — Ambulatory Visit: Payer: Medicaid Other | Admitting: *Deleted

## 2022-04-24 ENCOUNTER — Ambulatory Visit: Payer: Medicaid Other | Attending: Obstetrics

## 2022-04-24 VITALS — BP 121/64 | HR 81

## 2022-04-24 DIAGNOSIS — O99212 Obesity complicating pregnancy, second trimester: Secondary | ICD-10-CM | POA: Insufficient documentation

## 2022-04-24 DIAGNOSIS — O0993 Supervision of high risk pregnancy, unspecified, third trimester: Secondary | ICD-10-CM | POA: Diagnosis present

## 2022-04-24 DIAGNOSIS — F32A Depression, unspecified: Secondary | ICD-10-CM

## 2022-04-24 DIAGNOSIS — O24410 Gestational diabetes mellitus in pregnancy, diet controlled: Secondary | ICD-10-CM | POA: Insufficient documentation

## 2022-04-24 DIAGNOSIS — J45909 Unspecified asthma, uncomplicated: Secondary | ICD-10-CM

## 2022-04-24 DIAGNOSIS — O99343 Other mental disorders complicating pregnancy, third trimester: Secondary | ICD-10-CM

## 2022-04-24 DIAGNOSIS — O35AXX0 Maternal care for other (suspected) fetal abnormality and damage, fetal facial anomalies, not applicable or unspecified: Secondary | ICD-10-CM

## 2022-04-24 DIAGNOSIS — D509 Iron deficiency anemia, unspecified: Secondary | ICD-10-CM | POA: Diagnosis present

## 2022-04-24 DIAGNOSIS — E669 Obesity, unspecified: Secondary | ICD-10-CM

## 2022-04-24 DIAGNOSIS — O99013 Anemia complicating pregnancy, third trimester: Secondary | ICD-10-CM | POA: Diagnosis present

## 2022-04-24 DIAGNOSIS — O361930 Maternal care for other isoimmunization, third trimester, not applicable or unspecified: Secondary | ICD-10-CM

## 2022-04-24 DIAGNOSIS — Q758 Other specified congenital malformations of skull and face bones: Secondary | ICD-10-CM | POA: Diagnosis present

## 2022-04-24 DIAGNOSIS — Z3A29 29 weeks gestation of pregnancy: Secondary | ICD-10-CM

## 2022-04-24 DIAGNOSIS — O99012 Anemia complicating pregnancy, second trimester: Secondary | ICD-10-CM | POA: Insufficient documentation

## 2022-04-24 DIAGNOSIS — O99513 Diseases of the respiratory system complicating pregnancy, third trimester: Secondary | ICD-10-CM

## 2022-04-24 DIAGNOSIS — O99213 Obesity complicating pregnancy, third trimester: Secondary | ICD-10-CM

## 2022-04-29 ENCOUNTER — Other Ambulatory Visit: Payer: Self-pay

## 2022-04-29 DIAGNOSIS — Z3A16 16 weeks gestation of pregnancy: Secondary | ICD-10-CM

## 2022-05-01 ENCOUNTER — Encounter: Payer: Medicaid Other | Admitting: Family Medicine

## 2022-05-01 ENCOUNTER — Ambulatory Visit (INDEPENDENT_AMBULATORY_CARE_PROVIDER_SITE_OTHER): Payer: Medicaid Other | Admitting: Family Medicine

## 2022-05-01 VITALS — BP 128/79 | HR 86 | Wt 241.0 lb

## 2022-05-01 DIAGNOSIS — O24410 Gestational diabetes mellitus in pregnancy, diet controlled: Secondary | ICD-10-CM

## 2022-05-01 DIAGNOSIS — O365930 Maternal care for other known or suspected poor fetal growth, third trimester, not applicable or unspecified: Secondary | ICD-10-CM

## 2022-05-01 DIAGNOSIS — Z3A30 30 weeks gestation of pregnancy: Secondary | ICD-10-CM

## 2022-05-01 DIAGNOSIS — O0993 Supervision of high risk pregnancy, unspecified, third trimester: Secondary | ICD-10-CM

## 2022-05-01 DIAGNOSIS — K219 Gastro-esophageal reflux disease without esophagitis: Secondary | ICD-10-CM

## 2022-05-01 DIAGNOSIS — O99013 Anemia complicating pregnancy, third trimester: Secondary | ICD-10-CM

## 2022-05-01 MED ORDER — METFORMIN HCL 500 MG PO TABS: 500.0000 mg | ORAL_TABLET | Freq: Every day | ORAL | 5 refills | Status: DC

## 2022-05-01 MED ORDER — FAMOTIDINE 20 MG PO TABS: 20.0000 mg | ORAL_TABLET | Freq: Two times a day (BID) | ORAL | 3 refills | Status: DC

## 2022-05-01 NOTE — Progress Notes (Signed)
CC: Pt stating that she is having a lot of heartburn and has been up since 3 am due to the heartburn

## 2022-05-01 NOTE — Progress Notes (Signed)
PRENATAL VISIT NOTE  Subjective:  Carolyn Alvarez is a 32 y.o. G5P1031 at [redacted]w[redacted]d being seen today for ongoing prenatal care.  She is currently monitored for the following issues for this high-risk pregnancy and has Depression, recurrent ([place]); ECZEMA, ATOPIC; Maternal iron deficiency anemia complicating pregnancy in third trimester; History of gonorrhea; Suicidal ideation; Mild persistent asthma without complication; Seasonal and perennial allergic rhinitis; Left foot pain; Supervision of high-risk pregnancy; Nipple discharge; Gestational diabetes mellitus (GDM), antepartum; History of gestational hypertension; Maternal morbid obesity, antepartum ([place]); Asthma complicating pregnancy in third trimester; and Poor fetal growth affecting management of mother in third trimester on their problem list.  Patient reports heartburn and nausea.  Contractions: Not present. Vag. Bleeding: None.  Movement: Present. Denies leaking of fluid.   The following portions of the patient's history were reviewed and updated as appropriate: allergies, current medications, past family history, past medical history, past social history, past surgical history and problem list.   Objective:   Vitals:   05/01/22 0851  BP: 128/79  Pulse: 86  Weight: 241 lb (109.3 kg)    Fetal Status: Fetal Heart Rate (bpm): 148 Fundal Height: 29 cm Movement: Present     General:  Alert, oriented and cooperative. Patient is in no acute distress.  Skin: Skin is warm and dry. No rash noted.   Cardiovascular: Normal heart rate noted  Respiratory: Normal respiratory effort, no problems with respiration noted  Abdomen: Soft, gravid, appropriate for gestational age.  Pain/Pressure: Absent     Pelvic: Cervical exam deferred        Extremities: Normal range of motion.  Edema: None  Mental Status: Normal mood and affect. Normal behavior. Normal judgment and thought content.   Assessment and Plan:  Pregnancy: [id number] at [redacted]w[redacted]d 1. Diet  controlled gestational diabetes mellitus (GDM), antepartum Reviewed log Fastings 100-110s, 2h pp are 120s but occasional 140s Agrees to start Metformin Metformin 500 QHS-- plan to escalate to BID if necessary  2. Maternal iron deficiency anemia complicating pregnancy in third trimester Lab Results  Component Value Date   HGB 9.3 (L) 04/14/2022   HGB 9.1 (A) 03/12/2022   HGB 10.2 (L) 01/20/2022  Agrees to Fe infusion She has been on oral Fe for nearly 2 months with no improved values  3. Maternal morbid obesity, antepartum (HCC) TWG=26 lb (11.8 kg)   4. Supervision of high risk pregnancy in third trimester Up to date   5. Poor fetal growth affecting management of mother in third trimester Growth [place] on 3/15 showed 1300g, 12th% Repeat growth was scheduled MFM recommended ante testing at 32 wks-- they have set up BPP weekly  Preterm labor symptoms and general obstetric precautions including but not limited to vaginal bleeding, contractions, leaking of fluid and fetal movement were reviewed in detail with the patient. Please refer to After Visit Summary for other counseling recommendations.   Return in about 2 weeks (around 05/15/2022) for Routine prenatal care.  Future Appointments  Date Time Provider [place]  05/15/2022 10:35 AM Caren Macadam, MD [facility]  05/18/2022  9:30 AM [facility]  05/25/2022  2:00 PM [facility] US1 [facility] [place]  05/29/2022 10:55 AM Caren Macadam, MD [facility]  05/29/2022  1:30 PM [facility] NURSE [facility]  05/29/2022  1:45 PM [facility] US4 [facility]  06/05/2022 10:30 AM [facility] NURSE [facility]  06/05/2022 10:45 AM [facility] US6 [facility]  06/12/2022 10:35 AM Ernestina Patches, Juanita Craver, MD [facility]  06/12/2022 12:30 PM [facility] NURSE [facility]  06/12/2022 12:45 PM [facility] US4 [facility]  06/19/2022 11:15 AM Caren Macadam, MD [facility]  06/26/2022 10:35  AM Anyanwu, Sallyanne Havers, MD [facility]  07/03/2022 10:35 AM Caren Macadam, MD [facility]  07/10/2022 10:35 AM Aletha Halim, MD [facility]    Caren Macadam, MD

## 2022-05-01 NOTE — Patient Instructions (Signed)

## 2022-05-04 ENCOUNTER — Telehealth: Payer: Self-pay

## 2022-05-04 NOTE — Telephone Encounter (Signed)
Second call TC to [place] today. LVM  Will await call from office. Was not able to get pt scheduled Friday.

## 2022-05-04 NOTE — Telephone Encounter (Signed)
Iron Infusion scheduled for 05/12/22 @ 8am.  Will confirm w/Dr.A on doses.

## 2022-05-11 ENCOUNTER — Ambulatory Visit: Payer: Medicaid Other | Admitting: Student

## 2022-05-11 NOTE — Progress Notes (Deleted)
  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 No follow-ups on file. Wells Guiles, DO 05/11/2022, 12:59 PM PGY-***, [facility] {    This will disappear when note is signed, click to select method of visit    :1}

## 2022-05-12 ENCOUNTER — Encounter (HOSPITAL_COMMUNITY): Payer: Medicaid Other

## 2022-05-15 ENCOUNTER — Encounter: Payer: Self-pay | Admitting: Family Medicine

## 2022-05-15 ENCOUNTER — Ambulatory Visit: Payer: Medicaid Other

## 2022-05-15 ENCOUNTER — Ambulatory Visit (INDEPENDENT_AMBULATORY_CARE_PROVIDER_SITE_OTHER): Payer: Medicaid Other | Admitting: Family Medicine

## 2022-05-15 ENCOUNTER — Ambulatory Visit: Payer: Medicaid Other | Attending: Obstetrics

## 2022-05-15 ENCOUNTER — Other Ambulatory Visit: Payer: Self-pay | Admitting: *Deleted

## 2022-05-15 VITALS — BP 119/72 | HR 84 | Wt 243.0 lb

## 2022-05-15 DIAGNOSIS — O99013 Anemia complicating pregnancy, third trimester: Secondary | ICD-10-CM

## 2022-05-15 DIAGNOSIS — R638 Other symptoms and signs concerning food and fluid intake: Secondary | ICD-10-CM

## 2022-05-15 DIAGNOSIS — O365930 Maternal care for other known or suspected poor fetal growth, third trimester, not applicable or unspecified: Secondary | ICD-10-CM

## 2022-05-15 DIAGNOSIS — O99213 Obesity complicating pregnancy, third trimester: Secondary | ICD-10-CM

## 2022-05-15 DIAGNOSIS — O24410 Gestational diabetes mellitus in pregnancy, diet controlled: Secondary | ICD-10-CM

## 2022-05-15 DIAGNOSIS — Z3A32 32 weeks gestation of pregnancy: Secondary | ICD-10-CM

## 2022-05-15 DIAGNOSIS — O0993 Supervision of high risk pregnancy, unspecified, third trimester: Secondary | ICD-10-CM

## 2022-05-15 DIAGNOSIS — D509 Iron deficiency anemia, unspecified: Secondary | ICD-10-CM

## 2022-05-15 MED ORDER — ACCU-CHEK SOFTCLIX LANCETS MISC: 1.0000 | Freq: Four times a day (QID) | 12 refills | Status: DC

## 2022-05-15 MED ORDER — HYDROXYZINE HCL 25 MG PO TABS: 25.0000 mg | ORAL_TABLET | Freq: Four times a day (QID) | ORAL | 2 refills | Status: DC | PRN

## 2022-05-15 NOTE — Progress Notes (Signed)
CC:  Pt stating that Ferric Maltol and the metformin prenatal and iron and pepcid she has been having nightmares badly and is wondering if it is coming from the medication  X 2 weeks

## 2022-05-15 NOTE — Addendum Note (Signed)
Addended by: Kennon Portela on: 05/15/2022 11:48 AM   Modules accepted: Orders

## 2022-05-15 NOTE — Progress Notes (Signed)
PRENATAL VISIT NOTE  Subjective:  Carolyn Alvarez is a 32 y.o. G5P1031 at 1248w6d being seen today for ongoing prenatal care.  She is currently monitored for the following issues for this low-risk pregnancy and has Depression, recurrent (HCC); ECZEMA, ATOPIC; Maternal iron deficiency anemia complicating pregnancy in third trimester; History of gonorrhea; Suicidal ideation; Mild persistent asthma without complication; Seasonal and perennial allergic rhinitis; Left foot pain; Supervision of high-risk pregnancy; Nipple discharge; Gestational diabetes mellitus (GDM), antepartum; History of gestational hypertension; Maternal morbid obesity, antepartum; Asthma complicating pregnancy in third trimester; and Poor fetal growth affecting management of mother in third trimester on their problem list.  Patient reports no complaints.  Contractions: Irregular. Vag. Bleeding: None.  Movement: Present. Denies leaking of fluid.   The following portions of the patient's history were reviewed and updated as appropriate: allergies, current medications, past family history, past medical history, past social history, past surgical history and problem list.   Objective:   Vitals:   05/15/22 1050  BP: 119/72  Pulse: 84  Weight: 243 lb (110.2 kg)    Fetal Status: Fetal Heart Rate (bpm): 147   Movement: Present     General:  Alert, oriented and cooperative. Patient is in no acute distress.  Skin: Skin is warm and dry. No rash noted.   Cardiovascular: Normal heart rate noted  Respiratory: Normal respiratory effort, no problems with respiration noted  Abdomen: Soft, gravid, appropriate for gestational age.  Pain/Pressure: Present     Pelvic: Cervical exam deferred        Extremities: Normal range of motion.  Edema: Trace  Mental Status: Normal mood and affect. Normal behavior. Normal judgment and thought content.   Assessment and Plan:  Pregnancy: G5P1031 at 1548w6d 1. Diet controlled gestational diabetes  mellitus (GDM), antepartum Fastings now under 95 consistently, PP 100-110s Ran out of lancets, sent into pharmacy Has growth US scheduled  2. Poor fetal growth affecting management of mother in third trimester Growth US scheduled EFW=1300g, 12th% on 3/15  3. Supervision of high risk pregnancy in third trimester Up to date TWG=28 lb (12.7 kg)  Reviewed her nightmares, think is is more anxiety driven. Sent in hydroxyzine to see if this helps. She is only sleeping about 4hr per night.   4. Maternal iron deficiency anemia complicating pregnancy in third trimester Last hgb 9.2,  On oral Fe and scheduled for IV Fe on 4/12 Recheck CBC and ferritin today  Preterm labor symptoms and general obstetric precautions including but not limited to vaginal bleeding, contractions, leaking of fluid and fetal movement were reviewed in detail with the patient. Please refer to After Visit Summary for other counseling recommendations.   Return in about 2 weeks (around 05/29/2022) for Routine prenatal care.  Future Appointments  Date Time Provider Department Center  05/15/2022  1:30 PM [facility]  05/15/2022  1:45 PM [facility] US[facility]  05/22/2022  8:00 AM MCINF-RM5 MC-MCINF None  05/29/2022 10:55 AM Federico FlakeNewton, Mazen Marcin Niles, MD [facility]  05/29/2022  1:00 PM [facility]  05/29/2022  1:15 PM [facility]  06/05/2022 10:30 AM [facility]URSE [facility]  06/05/2022 10:45 AM [facility] US6 [facility]  06/12/2022 10:35 AM Federico FlakeNewton, Keshanna Riso Niles, MD [facility]  06/12/2022 12:30 PM [facility]SE [facility]  06/12/2022 12:45 PM [facility] US4 [facility]  06/19/2022 11:15 AM Federico FlakeNewton, Jolissa Kapral Niles, MD [facility]  06/26/2022 10:35 AM Anyanwu, Jethro BastosUgonna A, MD [facility]  07/03/2022 10:35 AM  Federico Flake, MD [facility]  07/10/2022 10:35 AM Middleton Bing, MD [facility]    Federico Flake, MD

## 2022-05-16 LAB — CBC
Hematocrit: 28.8 % — ABNORMAL LOW (ref 34.0–46.6)
Hemoglobin: 9.4 g/dL — ABNORMAL LOW (ref 11.1–15.9)
MCH: 29.9 pg (ref 26.6–33.0)
MCHC: 32.6 g/dL (ref 31.5–35.7)
MCV: 92 fL (ref 79–97)
Platelets: 348 10*3/uL (ref 150–450)
RBC: 3.14 x10E6/uL — ABNORMAL LOW (ref 3.77–5.28)
RDW: 13.3 % (ref 11.7–15.4)
WBC: 7.1 10*3/uL (ref 3.4–10.8)

## 2022-05-16 LAB — IRON AND TIBC
Iron Saturation: 16 % (ref 15–55)
Iron: 63 ug/dL (ref 27–159)
Total Iron Binding Capacity: 391 ug/dL (ref 250–450)
UIBC: 328 ug/dL (ref 131–425)

## 2022-05-16 LAB — FERRITIN: Ferritin: 44 ng/mL (ref 15–150)

## 2022-05-18 ENCOUNTER — Ambulatory Visit: Payer: Medicaid Other

## 2022-05-22 ENCOUNTER — Ambulatory Visit: Payer: Medicaid Other

## 2022-05-22 ENCOUNTER — Emergency Department: Payer: Medicaid Other

## 2022-05-22 ENCOUNTER — Inpatient Hospital Stay (HOSPITAL_COMMUNITY): Admission: RE | Admit: 2022-05-22 | Payer: Medicaid Other | Source: Ambulatory Visit

## 2022-05-22 ENCOUNTER — Observation Stay
Admission: AD | Admit: 2022-05-22 | Discharge: 2022-05-22 | Disposition: A | Discharge status: Home / Self Care | Payer: Medicaid Other | Attending: Advanced Practice Midwife | Admitting: Advanced Practice Midwife

## 2022-05-22 ENCOUNTER — Emergency Department
Admission: EM | Admit: 2022-05-22 | Discharge: 2022-05-22 | Disposition: A | Discharge status: Home / Self Care | Payer: Medicaid Other | Attending: Emergency Medicine | Admitting: Emergency Medicine

## 2022-05-22 DIAGNOSIS — Z7982 Long term (current) use of aspirin: Secondary | ICD-10-CM | POA: Diagnosis not present

## 2022-05-22 DIAGNOSIS — J45909 Unspecified asthma, uncomplicated: Secondary | ICD-10-CM | POA: Insufficient documentation

## 2022-05-22 DIAGNOSIS — R109 Unspecified abdominal pain: Secondary | ICD-10-CM | POA: Insufficient documentation

## 2022-05-22 DIAGNOSIS — R1084 Generalized abdominal pain: Secondary | ICD-10-CM

## 2022-05-22 DIAGNOSIS — O26893 Other specified pregnancy related conditions, third trimester: Secondary | ICD-10-CM | POA: Diagnosis not present

## 2022-05-22 DIAGNOSIS — O99513 Diseases of the respiratory system complicating pregnancy, third trimester: Secondary | ICD-10-CM | POA: Diagnosis not present

## 2022-05-22 DIAGNOSIS — Y92410 Unspecified street and highway as the place of occurrence of the external cause: Secondary | ICD-10-CM | POA: Diagnosis not present

## 2022-05-22 DIAGNOSIS — O9A213 Injury, poisoning and certain other consequences of external causes complicating pregnancy, third trimester: Secondary | ICD-10-CM | POA: Diagnosis not present

## 2022-05-22 DIAGNOSIS — Z3A33 33 weeks gestation of pregnancy: Secondary | ICD-10-CM | POA: Diagnosis not present

## 2022-05-22 DIAGNOSIS — Z3A34 34 weeks gestation of pregnancy: Secondary | ICD-10-CM | POA: Insufficient documentation

## 2022-05-22 DIAGNOSIS — S3991XA Unspecified injury of abdomen, initial encounter: Secondary | ICD-10-CM

## 2022-05-22 LAB — CBC WITH DIFFERENTIAL/PLATELET
Abs Immature Granulocytes: 0.04 10*3/uL (ref 0.00–0.07)
Basophils Absolute: 0 10*3/uL (ref 0.0–0.1)
Basophils Relative: 0 %
Eosinophils Absolute: 0 10*3/uL (ref 0.0–0.5)
Eosinophils Relative: 0 %
HCT: 31.3 % — ABNORMAL LOW (ref 36.0–46.0)
Hemoglobin: 10 g/dL — ABNORMAL LOW (ref 12.0–15.0)
Immature Granulocytes: 1 %
Lymphocytes Relative: 25 %
Lymphs Abs: 2 10*3/uL (ref 0.7–4.0)
MCH: 29.8 pg (ref 26.0–34.0)
MCHC: 31.9 g/dL (ref 30.0–36.0)
MCV: 93.2 fL (ref 80.0–100.0)
Monocytes Absolute: 0.7 10*3/uL (ref 0.1–1.0)
Monocytes Relative: 9 %
Neutro Abs: 5 10*3/uL (ref 1.7–7.7)
Neutrophils Relative %: 65 %
Platelets: 351 10*3/uL (ref 150–400)
RBC: 3.36 MIL/uL — ABNORMAL LOW (ref 3.87–5.11)
RDW: 14.5 % (ref 11.5–15.5)
WBC: 7.7 10*3/uL (ref 4.0–10.5)
nRBC: 0.3 % — ABNORMAL HIGH (ref 0.0–0.2)

## 2022-05-22 LAB — BASIC METABOLIC PANEL
Anion gap: 11 (ref 5–15)
BUN: 9 mg/dL (ref 6–20)
CO2: 20 mmol/L — ABNORMAL LOW (ref 22–32)
Calcium: 9 mg/dL (ref 8.9–10.3)
Chloride: 101 mmol/L (ref 98–111)
Creatinine, Ser: 0.53 mg/dL (ref 0.44–1.00)
GFR, Estimated: >60 mL/min (ref >60)
Glucose, Bld: 92 mg/dL (ref 70–99)
Potassium: 3.9 mmol/L (ref 3.5–5.1)
Sodium: 132 mmol/L — ABNORMAL LOW (ref 135–145)

## 2022-05-22 LAB — GLUCOSE, CAPILLARY
Glucose-Capillary: 75 mg/dL (ref 70–99)
Glucose-Capillary: 157 mg/dL — ABNORMAL HIGH (ref 70–99)

## 2022-05-22 MED ORDER — SODIUM CHLORIDE 0.9 % IV BOLUS: 500.0000 mL | Freq: Once | INTRAVENOUS | Status: DC | PRN

## 2022-05-22 MED ORDER — ACETAMINOPHEN 500 MG PO TABS
1000.0000 mg | ORAL_TABLET | Freq: Once | ORAL | Status: AC
  Administered 2022-05-22: 1000 mg via ORAL
  Filled 2022-05-22: qty 2

## 2022-05-22 MED ORDER — SODIUM CHLORIDE 0.9 % IV SOLN
300.0000 mg | Freq: Once | INTRAVENOUS | Status: DC
  Filled 2022-05-22: qty 15

## 2022-05-22 MED ORDER — DIPHENHYDRAMINE HCL 50 MG/ML IJ SOLN: 25.0000 mg | Freq: Once | INTRAMUSCULAR | Status: DC | PRN

## 2022-05-22 MED ORDER — ACETAMINOPHEN 325 MG PO TABS: 650.0000 mg | ORAL_TABLET | Freq: Once | ORAL | Status: DC
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
## 2022-05-22 NOTE — Discharge Instructions (Signed)
Go to obstetrics for further testing of your fetus now.  Tylenol 650 mg every 6 hours as needed for pain.

## 2022-05-23 DIAGNOSIS — O26893 Other specified pregnancy related conditions, third trimester: Secondary | ICD-10-CM | POA: Diagnosis not present

## 2022-05-25 ENCOUNTER — Ambulatory Visit: Payer: Medicaid Other

## 2022-05-29 ENCOUNTER — Ambulatory Visit: Payer: Medicaid Other

## 2022-05-29 ENCOUNTER — Other Ambulatory Visit (HOSPITAL_COMMUNITY): Payer: Self-pay | Admitting: *Deleted

## 2022-05-29 ENCOUNTER — Ambulatory Visit: Payer: Medicaid Other | Attending: Obstetrics and Gynecology | Admitting: *Deleted

## 2022-05-29 ENCOUNTER — Encounter: Payer: Self-pay | Admitting: Family Medicine

## 2022-05-29 ENCOUNTER — Ambulatory Visit (INDEPENDENT_AMBULATORY_CARE_PROVIDER_SITE_OTHER): Payer: Medicaid Other | Admitting: Family Medicine

## 2022-05-29 VITALS — BP 124/87 | HR 73 | Wt 250.0 lb

## 2022-05-29 DIAGNOSIS — O24415 Gestational diabetes mellitus in pregnancy, controlled by oral hypoglycemic drugs: Secondary | ICD-10-CM

## 2022-05-29 DIAGNOSIS — O0993 Supervision of high risk pregnancy, unspecified, third trimester: Secondary | ICD-10-CM

## 2022-05-29 DIAGNOSIS — D509 Iron deficiency anemia, unspecified: Secondary | ICD-10-CM

## 2022-05-29 DIAGNOSIS — O24410 Gestational diabetes mellitus in pregnancy, diet controlled: Secondary | ICD-10-CM

## 2022-05-29 DIAGNOSIS — O99213 Obesity complicating pregnancy, third trimester: Secondary | ICD-10-CM | POA: Diagnosis not present

## 2022-05-29 DIAGNOSIS — O99013 Anemia complicating pregnancy, third trimester: Secondary | ICD-10-CM

## 2022-05-29 DIAGNOSIS — Z3A34 34 weeks gestation of pregnancy: Secondary | ICD-10-CM | POA: Insufficient documentation

## 2022-05-29 DIAGNOSIS — Z3A33 33 weeks gestation of pregnancy: Secondary | ICD-10-CM

## 2022-05-29 DIAGNOSIS — O24419 Gestational diabetes mellitus in pregnancy, unspecified control: Secondary | ICD-10-CM | POA: Insufficient documentation

## 2022-05-29 DIAGNOSIS — O365930 Maternal care for other known or suspected poor fetal growth, third trimester, not applicable or unspecified: Secondary | ICD-10-CM
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
## 2022-05-29 NOTE — Procedures (Signed)
Carolyn Alvarez 07/12/1990 [redacted]w[redacted]d  Fetus A Non-Stress Test Interpretation for 05/29/22  Indication: Gestational Diabetes medication controlled, obese  Fetal Heart Rate A Mode: External Baseline Rate (A): 140 bpm Variability: Moderate Accelerations: 15 x 15 Decelerations: None Multiple birth?: No  Uterine Activity Mode: Toco Contraction Frequency (min): 1 [place] during NST Contraction Duration (sec): 50 Contraction Quality: Mild Resting Tone Palpated: Relaxed  Interpretation (Fetal Testing) Nonstress Test Interpretation: Reactive Overall Impression: Reassuring for gestational age Comments: Traicng reviewed byDr. Judeth Cornfield

## 2022-05-29 NOTE — Progress Notes (Unsigned)
   PRENATAL VISIT NOTE  Subjective:  Carolyn Alvarez is a 32 y.o. G5P1031 at [redacted]w[redacted]d being seen today for ongoing prenatal care.  She is currently monitored for the following issues for this high-risk pregnancy and has Depression, recurrent (HCC); ECZEMA, ATOPIC; Maternal iron deficiency anemia complicating pregnancy in third trimester; History of gonorrhea; Suicidal ideation; Mild persistent asthma without complication; Seasonal and perennial allergic rhinitis; Left foot pain; Supervision of high-risk pregnancy; Nipple discharge; Gestational diabetes mellitus (GDM), antepartum; History of gestational hypertension; Maternal morbid obesity, antepartum; Asthma complicating pregnancy in third trimester; Poor fetal growth affecting management of mother in third trimester; Labor and delivery, indication for care; and [redacted] weeks gestation of pregnancy on their problem list.  Patient reports  BH contractions .  Contractions: Irritability. Vag. Bleeding: None.  Movement: Present. Denies leaking of fluid.   The following portions of the patient's history were reviewed and updated as appropriate: allergies, current medications, past family history, past medical history, past social history, past surgical history and problem list.   Objective:   Vitals:   05/29/22 1130  BP: 124/87  Pulse: 73  Weight: 250 lb (113.4 kg)    Fetal Status: Fetal Heart Rate (bpm): 135   Movement: Present     General:  Alert, oriented and cooperative. Patient is in no acute distress.  Skin: Skin is warm and dry. No rash noted.   Cardiovascular: Normal heart rate noted  Respiratory: Normal respiratory effort, no problems with respiration noted  Abdomen: Soft, gravid, appropriate for gestational age.  Pain/Pressure: Present     Pelvic: {Blank single:19197::"Cervical exam performed in the presence of a chaperone","Cervical exam deferred"}        Extremities: Normal range of motion.  Edema: Trace  Mental Status: Normal mood  and affect. Normal behavior. Normal judgment and thought content.   Assessment and Plan:  Pregnancy: G5P1031 at [redacted]w[redacted]d 1. Diet controlled gestational diabetes mellitus (GDM), antepartum ***  2. Labor and delivery, indication for care ***  3. Poor fetal growth affecting management of mother in third trimester ***  4. Supervision of high risk pregnancy in third trimester ***  {Blank single:19197::"Term","Preterm"} labor symptoms and general obstetric precautions including but not limited to vaginal bleeding, contractions, leaking of fluid and fetal movement were reviewed in detail with the patient. Please refer to After Visit Summary for other counseling recommendations.   No follow-ups on file.  Future Appointments  Date Time Provider Department Center  05/29/2022  1:00 PM [facility] NURSE [facility]  05/29/2022  1:15 PM [facility] NST [facility]  06/01/2022  9:00 AM [id number] None  06/05/2022 10:30 AM [facility] NURSE [facility]  06/05/2022 10:45 AM [facility] US6 [facility]  06/12/2022 10:35 AM Federico Flake, MD [facility]  06/12/2022 11:15 AM Nestor Ramp, MD [facility]  06/12/2022 12:30 PM [facility] NURSE [facility]  06/12/2022 12:45 PM [facility] US4 [facility]  06/19/2022 11:15 AM Federico Flake, MD [facility]  06/26/2022 10:35 AM Anyanwu, Jethro Bastos, MD [facility]  07/03/2022 10:35 AM Federico Flake, MD [facility]  07/10/2022 10:35 AM [place] Bing, MD [facility]    Federico Flake, MD

## 2022-06-01 ENCOUNTER — Ambulatory Visit: Payer: Medicaid Other

## 2022-06-01 ENCOUNTER — Ambulatory Visit (HOSPITAL_COMMUNITY)
Admission: RE | Admit: 2022-06-01 | Discharge: 2022-06-01 | Disposition: A | Discharge status: Home / Self Care | Payer: Medicaid Other | Source: Ambulatory Visit | Attending: Obstetrics & Gynecology | Admitting: Obstetrics & Gynecology

## 2022-06-01 ENCOUNTER — Encounter: Payer: Self-pay | Admitting: Family Medicine

## 2022-06-01 DIAGNOSIS — D509 Iron deficiency anemia, unspecified: Secondary | ICD-10-CM | POA: Diagnosis present

## 2022-06-01 DIAGNOSIS — Z3A28 28 weeks gestation of pregnancy: Secondary | ICD-10-CM | POA: Insufficient documentation

## 2022-06-01 DIAGNOSIS — O0993 Supervision of high risk pregnancy, unspecified, third trimester: Secondary | ICD-10-CM | POA: Insufficient documentation

## 2022-06-01 DIAGNOSIS — O99013 Anemia complicating pregnancy, third trimester: Secondary | ICD-10-CM | POA: Insufficient documentation

## 2022-06-01 MED ORDER — METHYLPREDNISOLONE SODIUM SUCC 125 MG IJ SOLR
INTRAMUSCULAR | Status: AC
  Administered 2022-06-01: 125 mg via INTRAVENOUS
  Filled 2022-06-01: qty 2

## 2022-06-01 MED ORDER — ACETAMINOPHEN 325 MG PO TABS
ORAL_TABLET | ORAL | Status: AC
  Administered 2022-06-01: 975 mg via ORAL
  Filled 2022-06-01: qty 3

## 2022-06-01 MED ORDER — METHYLPREDNISOLONE SODIUM SUCC 125 MG IJ SOLR: 125.0000 mg | Freq: Once | INTRAMUSCULAR | Status: DC

## 2022-06-01 MED ORDER — METHYLPREDNISOLONE SODIUM SUCC 125 MG IJ SOLR: 125.0000 mg | INTRAMUSCULAR | Status: DC | PRN

## 2022-06-01 MED ORDER — SODIUM CHLORIDE 0.9 % IV SOLN
300.0000 mg | INTRAVENOUS | Status: DC
  Administered 2022-06-01: 300 mg via INTRAVENOUS
  Filled 2022-06-01: qty 300

## 2022-06-01 MED ORDER — LORATADINE 10 MG PO TABS: 10.0000 mg | ORAL_TABLET | Freq: Once | ORAL | Status: DC

## 2022-06-01 MED ORDER — ACETAMINOPHEN 500 MG PO TABS: 1000.0000 mg | ORAL_TABLET | Freq: Once | ORAL | Status: AC

## 2022-06-01 MED ORDER — IRON SUCROSE 500 MG IVPB - SIMPLE MED
300.0000 mg | INTRAVENOUS | Status: DC
  Filled 2022-06-01: qty 275

## 2022-06-02 ENCOUNTER — Encounter: Payer: Self-pay | Admitting: Family Medicine

## 2022-06-05 ENCOUNTER — Ambulatory Visit: Payer: Medicaid Other | Admitting: *Deleted

## 2022-06-05 ENCOUNTER — Other Ambulatory Visit: Payer: Self-pay | Admitting: Obstetrics

## 2022-06-05 ENCOUNTER — Ambulatory Visit: Payer: Medicaid Other | Attending: Obstetrics

## 2022-06-05 VITALS — BP 126/75 | HR 74

## 2022-06-05 DIAGNOSIS — O24410 Gestational diabetes mellitus in pregnancy, diet controlled: Secondary | ICD-10-CM | POA: Diagnosis not present

## 2022-06-05 DIAGNOSIS — O35AXX0 Maternal care for other (suspected) fetal abnormality and damage, fetal facial anomalies, not applicable or unspecified: Secondary | ICD-10-CM

## 2022-06-05 DIAGNOSIS — R638 Other symptoms and signs concerning food and fluid intake: Secondary | ICD-10-CM | POA: Diagnosis present

## 2022-06-05 DIAGNOSIS — O99513 Diseases of the respiratory system complicating pregnancy, third trimester: Secondary | ICD-10-CM

## 2022-06-05 DIAGNOSIS — O0993 Supervision of high risk pregnancy, unspecified, third trimester: Secondary | ICD-10-CM

## 2022-06-05 DIAGNOSIS — O24415 Gestational diabetes mellitus in pregnancy, controlled by oral hypoglycemic drugs: Secondary | ICD-10-CM

## 2022-06-05 DIAGNOSIS — O99213 Obesity complicating pregnancy, third trimester: Secondary | ICD-10-CM | POA: Diagnosis present

## 2022-06-05 DIAGNOSIS — O99013 Anemia complicating pregnancy, third trimester: Secondary | ICD-10-CM | POA: Insufficient documentation

## 2022-06-05 DIAGNOSIS — O99343 Other mental disorders complicating pregnancy, third trimester: Secondary | ICD-10-CM

## 2022-06-05 DIAGNOSIS — O403XX0 Polyhydramnios, third trimester, not applicable or unspecified: Secondary | ICD-10-CM | POA: Diagnosis not present

## 2022-06-05 DIAGNOSIS — J45909 Unspecified asthma, uncomplicated: Secondary | ICD-10-CM

## 2022-06-05 DIAGNOSIS — E669 Obesity, unspecified: Secondary | ICD-10-CM

## 2022-06-05 DIAGNOSIS — D509 Iron deficiency anemia, unspecified: Secondary | ICD-10-CM | POA: Diagnosis present

## 2022-06-05 DIAGNOSIS — O361930 Maternal care for other isoimmunization, third trimester, not applicable or unspecified: Secondary | ICD-10-CM

## 2022-06-05 DIAGNOSIS — Z3A35 35 weeks gestation of pregnancy: Secondary | ICD-10-CM

## 2022-06-05 DIAGNOSIS — F32A Depression, unspecified: Secondary | ICD-10-CM

## 2022-06-08 ENCOUNTER — Ambulatory Visit: Payer: Medicaid Other

## 2022-06-10 ENCOUNTER — Encounter: Payer: Self-pay | Admitting: *Deleted

## 2022-06-12 ENCOUNTER — Ambulatory Visit: Payer: Medicaid Other

## 2022-06-12 ENCOUNTER — Telehealth (INDEPENDENT_AMBULATORY_CARE_PROVIDER_SITE_OTHER): Payer: Medicaid Other | Admitting: Family Medicine

## 2022-06-12 ENCOUNTER — Inpatient Hospital Stay (HOSPITAL_COMMUNITY): Admission: RE | Admit: 2022-06-12 | Payer: Medicaid Other | Source: Ambulatory Visit

## 2022-06-12 ENCOUNTER — Ambulatory Visit: Payer: Medicaid Other | Attending: Obstetrics

## 2022-06-12 ENCOUNTER — Ambulatory Visit: Payer: Medicaid Other | Admitting: Family Medicine

## 2022-06-12 DIAGNOSIS — Z3A36 36 weeks gestation of pregnancy: Secondary | ICD-10-CM

## 2022-06-12 DIAGNOSIS — O24415 Gestational diabetes mellitus in pregnancy, controlled by oral hypoglycemic drugs: Secondary | ICD-10-CM

## 2022-06-12 DIAGNOSIS — D509 Iron deficiency anemia, unspecified: Secondary | ICD-10-CM

## 2022-06-12 DIAGNOSIS — O365930 Maternal care for other known or suspected poor fetal growth, third trimester, not applicable or unspecified: Secondary | ICD-10-CM

## 2022-06-12 DIAGNOSIS — O99013 Anemia complicating pregnancy, third trimester: Secondary | ICD-10-CM

## 2022-06-12 DIAGNOSIS — O0993 Supervision of high risk pregnancy, unspecified, third trimester: Secondary | ICD-10-CM
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
## 2022-06-12 NOTE — Progress Notes (Signed)
ROB  CC: Swelling/discomfort in feet, no longer able to wear shoes. More discomfort in right foot. No headaches, no visual changes.

## 2022-06-15 ENCOUNTER — Ambulatory Visit: Payer: Medicaid Other

## 2022-06-16 ENCOUNTER — Encounter (HOSPITAL_COMMUNITY)
Admission: RE | Admit: 2022-06-16 | Discharge: 2022-06-16 | Disposition: A | Discharge status: Home / Self Care | Payer: Medicaid Other | Source: Ambulatory Visit | Attending: Obstetrics & Gynecology | Admitting: Obstetrics & Gynecology

## 2022-06-16 DIAGNOSIS — O99013 Anemia complicating pregnancy, third trimester: Secondary | ICD-10-CM | POA: Diagnosis present

## 2022-06-16 DIAGNOSIS — D509 Iron deficiency anemia, unspecified: Secondary | ICD-10-CM | POA: Diagnosis not present

## 2022-06-16 MED ORDER — METHYLPREDNISOLONE SODIUM SUCC 125 MG IJ SOLR
125.0000 mg | Freq: Once | INTRAMUSCULAR | Status: AC
  Administered 2022-06-16: 125 mg via INTRAVENOUS

## 2022-06-16 MED ORDER — METHYLPREDNISOLONE SODIUM SUCC 125 MG IJ SOLR
INTRAMUSCULAR | Status: AC
  Filled 2022-06-16: qty 2

## 2022-06-16 MED ORDER — LORATADINE 10 MG PO TABS
10.0000 mg | ORAL_TABLET | Freq: Once | ORAL | Status: AC
  Administered 2022-06-16: 10 mg via ORAL

## 2022-06-16 MED ORDER — ACETAMINOPHEN 325 MG PO TABS
975.0000 mg | ORAL_TABLET | Freq: Once | ORAL | Status: AC
  Administered 2022-06-16: 975 mg via ORAL

## 2022-06-16 MED ORDER — LORATADINE 10 MG PO TABS
ORAL_TABLET | ORAL | Status: AC
  Filled 2022-06-16: qty 1

## 2022-06-16 MED ORDER — ACETAMINOPHEN 325 MG PO TABS
ORAL_TABLET | ORAL | Status: AC
  Filled 2022-06-16: qty 3

## 2022-06-16 MED ORDER — SODIUM CHLORIDE 0.9 % IV SOLN
300.0000 mg | Freq: Once | INTRAVENOUS | Status: AC
  Administered 2022-06-16: 300 mg via INTRAVENOUS
  Filled 2022-06-16: qty 300

## 2022-06-17 ENCOUNTER — Ambulatory Visit: Payer: Medicaid Other | Attending: Maternal & Fetal Medicine

## 2022-06-17 ENCOUNTER — Ambulatory Visit: Payer: Medicaid Other

## 2022-06-19 ENCOUNTER — Ambulatory Visit (INDEPENDENT_AMBULATORY_CARE_PROVIDER_SITE_OTHER): Payer: Medicaid Other | Admitting: Family Medicine

## 2022-06-19 VITALS — BP 131/84 | HR 88 | Wt 261.0 lb

## 2022-06-19 DIAGNOSIS — D509 Iron deficiency anemia, unspecified: Secondary | ICD-10-CM

## 2022-06-19 DIAGNOSIS — O0993 Supervision of high risk pregnancy, unspecified, third trimester: Secondary | ICD-10-CM

## 2022-06-19 DIAGNOSIS — Z3A37 37 weeks gestation of pregnancy: Secondary | ICD-10-CM

## 2022-06-19 DIAGNOSIS — O99013 Anemia complicating pregnancy, third trimester: Secondary | ICD-10-CM

## 2022-06-19 DIAGNOSIS — O24415 Gestational diabetes mellitus in pregnancy, controlled by oral hypoglycemic drugs: Secondary | ICD-10-CM

## 2022-06-19 DIAGNOSIS — O99210 Obesity complicating pregnancy, unspecified trimester: Secondary | ICD-10-CM

## 2022-06-19 NOTE — Progress Notes (Signed)
   PRENATAL VISIT NOTE  Subjective:  Carolyn Alvarez is a 32 y.o. G5P1031 at [redacted]w[redacted]d being seen today for ongoing prenatal care.  She is currently monitored for the following issues for this high-risk pregnancy and has Maternal iron deficiency anemia complicating pregnancy in third trimester; Supervision of high-risk pregnancy; Gestational diabetes mellitus (GDM) controlled on oral hypoglycemic drug; History of gestational hypertension; Maternal morbid obesity, antepartum (HCC); Asthma complicating pregnancy in third trimester; and Poor fetal growth affecting management of mother in third trimester on their problem list.  Patient reports  bilateral foot swelling .  Contractions: Irregular. Vag. Bleeding: None.  Movement: Present. Denies leaking of fluid.   The following portions of the patient's history were reviewed and updated as appropriate: allergies, current medications, past family history, past medical history, past social history, past surgical history and problem list.   Objective:   Vitals:   06/19/22 1128  BP: 131/84  Pulse: 88  Weight: 261 lb (118.4 kg)    Fetal Status: Fetal Heart Rate (bpm): 135   Movement: Present     General:  Alert, oriented and cooperative. Patient is in no acute distress.  Skin: Skin is warm and dry. No rash noted.   Cardiovascular: Normal heart rate noted  Respiratory: Normal respiratory effort, no problems with respiration noted  Abdomen: Soft, gravid, appropriate for gestational age.  Pain/Pressure: Present     Pelvic: Cervical exam deferred        Extremities: Normal range of motion.  Edema: Trace  Mental Status: Normal mood and affect. Normal behavior. Normal judgment and thought content.   Assessment and Plan:  Pregnancy: G5P1031 at [redacted]w[redacted]d 1. Supervision of high risk pregnancy in third trimester Up to date Swelling in feet -- she reports she did not elevated them last night She denies HA, RUQ pain, changes in vision and BP today is  WNL We discussed in detail going to the hospital and PEC s/sx  2. Gestational diabetes mellitus (GDM) in third trimester controlled on oral hypoglycemic drug Missed BPP this week-- she slept through it. Fastings 90-122, only occasionally out of range per patient 2hr 114-122 She is taking Metformin 1000mg /500mg  EFW 87th% and AC >99th% Extensive discussion last visit about IOL. Patient accepted IOL for 5/20 at [redacted]w[redacted]d and confirm she is OK with this date today  3. Maternal morbid obesity, antepartum (HCC) TWG=46 lb (20.9 kg)   4. Maternal iron deficiency anemia complicating pregnancy in third trimester S/p Fe infusion on 4/22 and 5/7 Last HGB ws 9.4 (on 4/5) Hopeful to see improvement by delivery or after  Term labor symptoms and general obstetric precautions including but not limited to vaginal bleeding, contractions, leaking of fluid and fetal movement were reviewed in detail with the patient. Please refer to After Visit Summary for other counseling recommendations.   No follow-ups on file.  Future Appointments  Date Time Provider Department Center  06/23/2022  8:30 AM [facility] NURSE [facility]  06/23/2022  8:45 AM [facility] NST [facility]  06/26/2022 10:35 AM Anyanwu, Jethro Bastos, MD [facility]  06/29/2022  7:15 AM [facility] SCHED ROOM [facility] None  07/03/2022 10:35 AM Federico Flake, MD [facility]  07/10/2022 10:35 AM Bolton Bing, MD [facility]    Federico Flake, MD

## 2022-06-19 NOTE — Progress Notes (Signed)
CC: ROB  Lower pelvic  Some swelling

## 2022-06-23 ENCOUNTER — Telehealth (HOSPITAL_COMMUNITY): Payer: Self-pay | Admitting: *Deleted

## 2022-06-23 ENCOUNTER — Ambulatory Visit: Payer: Medicaid Other | Admitting: *Deleted

## 2022-06-23 ENCOUNTER — Encounter (HOSPITAL_COMMUNITY): Payer: Self-pay

## 2022-06-23 ENCOUNTER — Ambulatory Visit: Payer: Medicaid Other | Attending: Maternal & Fetal Medicine | Admitting: *Deleted

## 2022-06-23 VITALS — BP 131/87

## 2022-06-23 VITALS — BP 146/90 | HR 79

## 2022-06-23 DIAGNOSIS — O99213 Obesity complicating pregnancy, third trimester: Secondary | ICD-10-CM | POA: Insufficient documentation

## 2022-06-23 DIAGNOSIS — Z8759 Personal history of other complications of pregnancy, childbirth and the puerperium: Secondary | ICD-10-CM | POA: Diagnosis not present

## 2022-06-23 DIAGNOSIS — O24415 Gestational diabetes mellitus in pregnancy, controlled by oral hypoglycemic drugs: Secondary | ICD-10-CM | POA: Insufficient documentation

## 2022-06-23 DIAGNOSIS — E669 Obesity, unspecified: Secondary | ICD-10-CM | POA: Diagnosis not present

## 2022-06-23 DIAGNOSIS — Z3A38 38 weeks gestation of pregnancy: Secondary | ICD-10-CM | POA: Diagnosis not present

## 2022-06-23 DIAGNOSIS — O409XX0 Polyhydramnios, unspecified trimester, not applicable or unspecified: Secondary | ICD-10-CM | POA: Diagnosis present

## 2022-06-23 DIAGNOSIS — O403XX0 Polyhydramnios, third trimester, not applicable or unspecified: Secondary | ICD-10-CM | POA: Diagnosis not present

## 2022-06-23 DIAGNOSIS — D509 Iron deficiency anemia, unspecified: Secondary | ICD-10-CM

## 2022-06-23 DIAGNOSIS — O0993 Supervision of high risk pregnancy, unspecified, third trimester: Secondary | ICD-10-CM

## 2022-06-23 NOTE — Procedures (Signed)
Elvia D Rubin Jun 20, 1990 [redacted]w[redacted]d  Fetus A Non-Stress Test Interpretation for 06/23/22  Indication: Gestational Diabetes medication controlled, obesity, polyhydramnios  Fetal Heart Rate A Mode: External Baseline Rate (A): 130 bpm Variability: Moderate Accelerations: 15 x 15 Decelerations: None Multiple birth?: No  Uterine Activity Mode: Palpation, Toco Contraction Frequency (min): Occas w/UI Contraction Quality: Mild Resting Tone Palpated: Relaxed Resting Time: Adequate  Interpretation (Fetal Testing) Nonstress Test Interpretation: Reactive Comments: Dr. Judeth Cornfield reviewed tracing.

## 2022-06-23 NOTE — Telephone Encounter (Signed)
Preadmission screen  

## 2022-06-24 ENCOUNTER — Encounter: Payer: Self-pay | Admitting: *Deleted

## 2022-06-24 ENCOUNTER — Telehealth (HOSPITAL_COMMUNITY): Payer: Self-pay | Admitting: *Deleted

## 2022-06-24 ENCOUNTER — Other Ambulatory Visit: Payer: Self-pay | Admitting: Advanced Practice Midwife

## 2022-06-24 NOTE — Telephone Encounter (Signed)
Preadmission screen  

## 2022-06-26 ENCOUNTER — Other Ambulatory Visit: Payer: Self-pay | Admitting: Advanced Practice Midwife

## 2022-06-26 ENCOUNTER — Telehealth (HOSPITAL_COMMUNITY): Payer: Self-pay | Admitting: *Deleted

## 2022-06-26 ENCOUNTER — Ambulatory Visit (INDEPENDENT_AMBULATORY_CARE_PROVIDER_SITE_OTHER): Payer: Medicaid Other | Admitting: Obstetrics & Gynecology

## 2022-06-26 ENCOUNTER — Encounter: Payer: Self-pay | Admitting: Obstetrics & Gynecology

## 2022-06-26 VITALS — BP 123/79 | HR 94 | Wt 254.0 lb

## 2022-06-26 DIAGNOSIS — O24415 Gestational diabetes mellitus in pregnancy, controlled by oral hypoglycemic drugs: Secondary | ICD-10-CM | POA: Diagnosis not present

## 2022-06-26 DIAGNOSIS — Z3A38 38 weeks gestation of pregnancy: Secondary | ICD-10-CM

## 2022-06-26 DIAGNOSIS — O0993 Supervision of high risk pregnancy, unspecified, third trimester: Secondary | ICD-10-CM

## 2022-06-26 DIAGNOSIS — O99210 Obesity complicating pregnancy, unspecified trimester: Secondary | ICD-10-CM | POA: Diagnosis not present

## 2022-06-26 DIAGNOSIS — O403XX0 Polyhydramnios, third trimester, not applicable or unspecified: Secondary | ICD-10-CM | POA: Diagnosis not present

## 2022-06-26 DIAGNOSIS — O24419 Gestational diabetes mellitus in pregnancy, unspecified control: Secondary | ICD-10-CM

## 2022-06-26 DIAGNOSIS — Z8759 Personal history of other complications of pregnancy, childbirth and the puerperium: Secondary | ICD-10-CM | POA: Diagnosis not present

## 2022-06-26 NOTE — Telephone Encounter (Signed)
Preadmission screen  

## 2022-06-26 NOTE — Progress Notes (Signed)
PRENATAL VISIT NOTE  Subjective:  Carolyn Alvarez is a 32 y.o. G5P1031 at [redacted]w[redacted]d being seen today for ongoing prenatal care.  She is currently monitored for the following issues for this high-risk pregnancy and has Maternal iron deficiency anemia complicating pregnancy in third trimester; Supervision of high-risk pregnancy; Gestational diabetes mellitus (GDM) controlled on oral hypoglycemic drug; History of gestational hypertension; Maternal morbid obesity, antepartum (HCC); and Asthma complicating pregnancy in third trimester on their problem list.  Patient reports occasional contractions. Desires cervical check.  Contractions: Irritability. Vag. Bleeding: None.  Movement: Present. Denies leaking of fluid.   The following portions of the patient's history were reviewed and updated as appropriate: allergies, current medications, past family history, past medical history, past social history, past surgical history and problem list.   Objective:   Vitals:   06/26/22 1057 06/26/22 1106  BP: (!) 135/91 123/79  Pulse: 80 94  Weight: 254 lb (115.2 kg)     Fetal Status: Fetal Heart Rate (bpm): 156   Movement: Present  Presentation: Vertex  General:  Alert, oriented and cooperative. Patient is in no acute distress.  Skin: Skin is warm and dry. No rash noted.   Cardiovascular: Normal heart rate noted  Respiratory: Normal respiratory effort, no problems with respiration noted  Abdomen: Soft, gravid, appropriate for gestational age.  Pain/Pressure: Present     Pelvic: Cervical exam performed in the presence of a chaperone Dilation: 4 Effacement (%): 70 Station: -3  Extremities: Normal range of motion.  Edema: None  Mental Status: Normal mood and affect. Normal behavior. Normal judgment and thought content.   [place] MFM FETAL BPP WO NON STRESS  Result Date: 06/05/2022 ----------------------------------------------------------------------  OBSTETRICS REPORT                       (Signed Final  06/05/2022 04:57 pm) ---------------------------------------------------------------------- Patient Info  ID #:       [id number]                          D.O.B.:  Sep 28, 1990 (32 yrs)  Name:       Carolyn Alvarez                      Visit Date: 06/05/2022 10:39 am              [place] ---------------------------------------------------------------------- Performed By  Attending:        Braxton Feathers DO       Ref. Address:     [address]                                                             [address]  Performed By:     Carollee Leitz,          [facility]  Referred By:      [facility] Mila Merry ---------------------------------------------------------------------- Orders  #  Description                           Code        Ordered By  1  [place] MFM FETAL BPP WO NON               [id number]    YU FANG     STRESS  2  [place] MFM OB FOLLOW UP                   [id number]    YU FANG ----------------------------------------------------------------------  #  Order #                     Accession #                Episode #  1  [id number]                   [id number]                 [id number]  2  [id number]                   [id number]                 [id number] ---------------------------------------------------------------------- Indications  Gestational diabetes in pregnancy, diet        O24.410  controlled  Obesity complicating pregnancy, third          O99.213  trimester (pregravid BMI 41)  Polyhydramnios, third trimester, antepartum    O40.3XX0  condition or complication, unspecified fetus  Anemia during pregnancy in third trimester     O99.013  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)  Asthma                                         O99.89 j45.909  Other mental disorder  complicating             O99.340  pregnancy, third trimester  Abnormal fetal ultrasound (absent nasal        O28.9  bone)  [redacted] weeks gestation of pregnancy                Z3A.35 ---------------------------------------------------------------------- Vital Signs  BP:          126/75 ---------------------------------------------------------------------- Fetal Evaluation  Num Of Fetuses:         1  Fetal Heart Rate(bpm):  150  Cardiac Activity:       Observed  Presentation:           Cephalic  Placenta:               Anterior  P. Cord Insertion:      Previously visualized  Amniotic Fluid  AFI FV:      Polyhydramnios  AFI Sum(cm)     %  Tile       Largest Pocket(cm)  28.77           > 97        10.15  RUQ(cm)       RLQ(cm)       LUQ(cm)        LLQ(cm)  2.72          9.83          6.07           10.15 ---------------------------------------------------------------------- Biophysical Evaluation  Amniotic F.V:   Pocket => 2 cm             F. Tone:        Observed  F. Movement:    Observed                   Score:          8/8  F. Breathing:   Observed ---------------------------------------------------------------------- Biometry  BPD:      85.2  mm     G. Age:  34w 2d         17  %    CI:        77.66   %    70 - 86                                                          FL/HC:      21.5   %    20.1 - 22.1  HC:       306   mm     G. Age:  34w 1d        1.7  %    HC/AC:      0.85        0.93 - 1.11  AC:      360.9  mm     G. Age:  40w 0d       > 99  %    FL/BPD:     77.2   %    71 - 87  FL:       65.8  mm     G. Age:  33w 6d          7  %    FL/AC:      18.2   %    20 - 24  Est. FW:    3173  gm           7 lb     87  % ---------------------------------------------------------------------- OB History  Blood Type:   A+  Gravidity:    5         Term:   1  TOP:          3        Living:  1 ---------------------------------------------------------------------- Gestational Age  LMP:           35w 6d        Date:  09/27/21                   EDD:   07/04/22  U/S Today:     35w 4d  EDD:   07/06/22  Best:          35w 6d     Det. By:  LMP  (09/27/21)          EDD:   07/04/22 ---------------------------------------------------------------------- Anatomy  Cranium:               Appears normal         LVOT:                   Previously seen  Cavum:                 Previously seen        Aortic Arch:            Previously seen  Ventricles:            Previously seen        Ductal Arch:            Not well visualized  Choroid Plexus:        Previously seen        Diaphragm:              Previously seen  Cerebellum:            Previously seen        Stomach:                Appears normal, left                                                                        sided  Posterior Fossa:       Previously seen        Abdomen:                Previously seen  Nuchal Fold:           Previously seen        Abdominal Wall:         Previously seen  Face:                  Absent nasal bone      Cord Vessels:           Previously seen  Lips:                  Previously seen        Kidneys:                Appear normal  Palate:                Previously seen        Bladder:                Appears normal  Thoracic:              Previously seen        Spine:                  Previously seen  Heart:                 Previously seen        Upper Extremities:      Previously seen  RVOT:  Previously seen        Lower Extremities:      Previously seen  Other:  Female gender previously seen.Lenses, maxilla, mandible, falx,          Heels/feet and open hands/5th digits, VC, 3VV, and 3VTV previously          visualized. Technically difficult due to maternal habitus. ---------------------------------------------------------------------- Comments  The patient is here for a follow-up BPP and growth ultrasound  at 35w 6d for gDMA1. EDD: 07/04/2022 dated by LMP  (09/27/21).  Nearly all her blood glucose values are elevated.  Her  fasting blood sugars between 95 and 125, her two hour  postprandial between 130-157.  She is only on metformin 500  mg daily.  I discussed the risk of stillbirth and growth  abnormalities in the setting of uncontrolled gestational  diabetes.  I instructed her to increase her metformin to 500  mg in the morning and 1000 mg at night.  Also discussed  insulin but she would like to try metformin at this time.  I  recommend that she be delivered around 37 weeks due to  the increased risk of stillbirth in the setting of macrosomia,  polyhydramnios and or uncontrolled blood sugars.  She  verbalized understanding but would like to potentially go  beyond 37 weeks if her antenatal testing is reassuring.  While  I do not recommend this if she does decide to go past 37  weeks I recommend twice weekly antenatal testing.  Sonographic findings  Single intrauterine pregnancy.  Fetal cardiac activity: Observed.  Presentation: Cephalic.  Interval fetal anatomy appears normal except for the  hypoplastic nasal bone.  Fetal biometry shows the estimated fetal weight at the 87  percentile.  Amniotic fluid volume: Polyhydramnios. AFI: 28.77 cm.  MVP:  10.15 cm.  Placenta: Anterior.  BPP: 8/8.  Recommendations  1. Consider twice weekly testing starting around 36 weeks  2. Growth ultrasounds every 4 weeks until delivery  3. Delivery around [redacted] weeks gestation  4. Increase metfomin as instructed ----------------------------------------------------------------------                  Braxton Feathers, DO Electronically Signed Final Report   06/05/2022 04:57 pm ----------------------------------------------------------------------  [place] MFM OB FOLLOW UP  Result Date: 06/05/2022 ----------------------------------------------------------------------  OBSTETRICS REPORT                       (Signed Final 06/05/2022 04:57 pm) ---------------------------------------------------------------------- Patient Info  ID #:       [id number]                           D.O.B.:  08-15-1990 (31 yrs)  Name:       Carolyn Alvarez                      Visit Date: 06/05/2022 10:39 am              [place] ---------------------------------------------------------------------- Performed By  Attending:        Braxton Feathers DO       Ref. Address:     [address]  Performed By:     Carollee Leitz,          [facility]  Referred By:      [facility] ---------------------------------------------------------------------- Orders  #  Description                           Code        Ordered By  1  [place] MFM FETAL BPP WO NON               [id number]    YU FANG     STRESS  2  [place] MFM OB FOLLOW UP                   [id number]    YU FANG ----------------------------------------------------------------------  #  Order #                     Accession #                Episode #  1  [id number]                   [id number]                 [id number]  2  [id number]                   [id number]                 [id number] ---------------------------------------------------------------------- Indications  Gestational diabetes in pregnancy, diet        O24.410  controlled  Obesity complicating pregnancy, third          O99.213  trimester (pregravid BMI 41)  Polyhydramnios, third trimester, antepartum    O40.3XX0  condition or complication, unspecified fetus  Anemia during pregnancy in third trimester     O99.013  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)  Asthma                                         O99.89 j45.909  Other mental disorder complicating             O99.340  pregnancy, third trimester  Abnormal fetal ultrasound (absent nasal        O28.9  bone)  [redacted] weeks gestation of  pregnancy                Z3A.35 ---------------------------------------------------------------------- Vital Signs  BP:          126/75 ---------------------------------------------------------------------- Fetal Evaluation  Num Of Fetuses:         1  Fetal Heart Rate(bpm):  150  Cardiac Activity:       Observed  Presentation:           Cephalic  Placenta:               Anterior  P. Cord Insertion:      Previously visualized  Amniotic Fluid  AFI FV:      Polyhydramnios  AFI Sum(cm)     %Tile       Largest Pocket(cm)  28.77           > 97        10.15  RUQ(cm)       RLQ(cm)       LUQ(cm)        LLQ(cm)  2.72          9.83          6.07           10.15 ---------------------------------------------------------------------- Biophysical Evaluation  Amniotic F.V:   Pocket => 2 cm             F. Tone:        Observed  F. Movement:    Observed                   Score:          8/8  F. Breathing:   Observed ---------------------------------------------------------------------- Biometry  BPD:      85.2  mm     G. Age:  34w 2d         17  %    CI:        77.66   %    70 - 86                                                          FL/HC:      21.5   %    20.1 - 22.1  HC:       306   mm     G. Age:  34w 1d        1.7  %    HC/AC:      0.85        0.93 - 1.11  AC:      360.9  mm     G. Age:  40w 0d       > 99  %    FL/BPD:     77.2   %    71 - 87  FL:       65.8  mm     G. Age:  33w 6d          7  %    FL/AC:      18.2   %    20 - 24  Est. FW:    3173  gm  7 lb     87  % ---------------------------------------------------------------------- OB History  Blood Type:   A+  Gravidity:    5         Term:   1  TOP:          3        Living:  1 ---------------------------------------------------------------------- Gestational Age  LMP:           35w 6d        Date:  09/27/21                  EDD:   07/04/22  U/S Today:     35w 4d                                        EDD:   07/06/22  Best:          35w 6d     Det. By:  LMP   (09/27/21)          EDD:   07/04/22 ---------------------------------------------------------------------- Anatomy  Cranium:               Appears normal         LVOT:                   Previously seen  Cavum:                 Previously seen        Aortic Arch:            Previously seen  Ventricles:            Previously seen        Ductal Arch:            Not well visualized  Choroid Plexus:        Previously seen        Diaphragm:              Previously seen  Cerebellum:            Previously seen        Stomach:                Appears normal, left                                                                        sided  Posterior Fossa:       Previously seen        Abdomen:                Previously seen  Nuchal Fold:           Previously seen        Abdominal Wall:         Previously seen  Face:                  Absent nasal bone      Cord Vessels:           Previously seen  Lips:  Previously seen        Kidneys:                Appear normal  Palate:                Previously seen        Bladder:                Appears normal  Thoracic:              Previously seen        Spine:                  Previously seen  Heart:                 Previously seen        Upper Extremities:      Previously seen  RVOT:                  Previously seen        Lower Extremities:      Previously seen  Other:  Female gender previously seen.Lenses, maxilla, mandible, falx,          Heels/feet and open hands/5th digits, VC, 3VV, and 3VTV previously          visualized. Technically difficult due to maternal habitus. ---------------------------------------------------------------------- Comments  The patient is here for a follow-up BPP and growth ultrasound  at 35w 6d for gDMA1. EDD: 07/04/2022 dated by LMP  (09/27/21).  Nearly all her blood glucose values are elevated.  Her fasting blood sugars between 95 and 125, her two hour  postprandial between 130-157.  She is only on metformin 500  mg daily.  I discussed the  risk of stillbirth and growth  abnormalities in the setting of uncontrolled gestational  diabetes.  I instructed her to increase her metformin to 500  mg in the morning and 1000 mg at night.  Also discussed  insulin but she would like to try metformin at this time.  I  recommend that she be delivered around 37 weeks due to  the increased risk of stillbirth in the setting of macrosomia,  polyhydramnios and or uncontrolled blood sugars.  She  verbalized understanding but would like to potentially go  beyond 37 weeks if her antenatal testing is reassuring.  While  I do not recommend this if she does decide to go past 37  weeks I recommend twice weekly antenatal testing.  Sonographic findings  Single intrauterine pregnancy.  Fetal cardiac activity: Observed.  Presentation: Cephalic.  Interval fetal anatomy appears normal except for the  hypoplastic nasal bone.  Fetal biometry shows the estimated fetal weight at the 87  percentile.  Amniotic fluid volume: Polyhydramnios. AFI: 28.77 cm.  MVP:  10.15 cm.  Placenta: Anterior.  BPP: 8/8.  Recommendations  1. Consider twice weekly testing starting around 36 weeks  2. Growth ultrasounds every 4 weeks until delivery  3. Delivery around [redacted] weeks gestation  4. Increase metfomin as instructed ----------------------------------------------------------------------                  Braxton Feathers, DO Electronically Signed Final Report   06/05/2022 04:57 pm ----------------------------------------------------------------------   Assessment and Plan:  Pregnancy: [id number] at [redacted]w[redacted]d 1. Gestational diabetes mellitus (GDM) in third trimester controlled on oral hypoglycemic drug 2. Polyhydramnios affecting pregnancy in third trimester IOL scheduled at 39 weeks.  Explained rational of this again with patient, she wanted to understand why she  was not being allowed to go into spontaneous labor.  Explained increased risk of IUFD, other morbidity given her GDM, BMI >40 and known  polyhydramnios.  She did not bring log, but reports BS mostly in range, controlled on metformin.  Explained overall high risk nature of this pregnancy and encouraged her to come for IOL as scheduled.  - Hemoglobin A1c  3. Maternal morbid obesity, antepartum (HCC) IOL scheduled at 39 weeks.    4. History of gestational hypertension Elevated initial BP today, recheck normal.  Patient denies any headaches, visual symptoms, RUQ/epigastric pain or other concerning symptoms.  Will check labs. - CBC - Comprehensive metabolic panel - Protein / creatinine ratio, urine  5. [redacted] weeks gestation of pregnancy 6. Supervision of high risk pregnancy in third trimester No other concerns. Reassured by favorable cervix.  Labor symptoms and general obstetric precautions including but not limited to vaginal bleeding, contractions, leaking of fluid and fetal movement were reviewed in detail with the patient. Please refer to After Visit Summary for other counseling recommendations.   Return for Postpartum check.  Future Appointments  Date Time Provider Department Center  06/29/2022  7:15 AM [facility] SCHED ROOM [facility] None    Jaynie Collins, MD

## 2022-06-27 LAB — CBC
Hematocrit: 32.9 % — ABNORMAL LOW (ref 34.0–46.6)
Hemoglobin: 10.8 g/dL — ABNORMAL LOW (ref 11.1–15.9)
MCH: 30.8 pg (ref 26.6–33.0)
MCHC: 32.8 g/dL (ref 31.5–35.7)
MCV: 94 fL (ref 79–97)
Platelets: 287 10*3/uL (ref 150–450)
RBC: 3.51 x10E6/uL — ABNORMAL LOW (ref 3.77–5.28)
RDW: 14.8 % (ref 11.7–15.4)
WBC: 6.5 10*3/uL (ref 3.4–10.8)

## 2022-06-27 LAB — COMPREHENSIVE METABOLIC PANEL
ALT: 11 IU/L (ref 0–32)
AST: 24 IU/L (ref 0–40)
Albumin/Globulin Ratio: 1.1 — ABNORMAL LOW (ref 1.2–2.2)
Albumin: 3.4 g/dL — ABNORMAL LOW (ref 3.9–4.9)
Alkaline Phosphatase: 205 IU/L — ABNORMAL HIGH (ref 44–121)
BUN/Creatinine Ratio: 9 (ref 9–23)
BUN: 6 mg/dL (ref 6–20)
Bilirubin Total: 0.3 mg/dL (ref 0.0–1.2)
CO2: 21 mmol/L (ref 20–29)
Calcium: 9.1 mg/dL (ref 8.7–10.2)
Chloride: 101 mmol/L (ref 96–106)
Creatinine, Ser: 0.69 mg/dL (ref 0.57–1.00)
Globulin, Total: 3.2 g/dL (ref 1.5–4.5)
Glucose: 92 mg/dL (ref 70–99)
Potassium: 4.2 mmol/L (ref 3.5–5.2)
Sodium: 136 mmol/L (ref 134–144)
Total Protein: 6.6 g/dL (ref 6.0–8.5)
eGFR: 117 mL/min/{1.73_m2} (ref >59)

## 2022-06-27 LAB — HEMOGLOBIN A1C
Est. average glucose Bld gHb Est-mCnc: 111 mg/dL
Hgb A1c MFr Bld: 5.5 % (ref 4.8–5.6)

## 2022-06-27 LAB — PROTEIN / CREATININE RATIO, URINE
Creatinine, Urine: 145.4 mg/dL
Protein, Ur: 22.2 mg/dL
Protein/Creat Ratio: 153 mg/g creat (ref 0–200)

## 2022-06-29 ENCOUNTER — Other Ambulatory Visit: Payer: Self-pay

## 2022-06-29 ENCOUNTER — Telehealth: Payer: Self-pay | Admitting: Family Medicine

## 2022-06-29 ENCOUNTER — Encounter (HOSPITAL_COMMUNITY): Payer: Self-pay | Admitting: Family Medicine

## 2022-06-29 ENCOUNTER — Inpatient Hospital Stay (HOSPITAL_COMMUNITY)
Admission: AD | Admit: 2022-06-29 | Discharge: 2022-07-01 | DRG: 768 | Disposition: A | Discharge status: Home / Self Care | Payer: Medicaid Other | Attending: Obstetrics and Gynecology | Admitting: Obstetrics and Gynecology

## 2022-06-29 ENCOUNTER — Encounter (HOSPITAL_COMMUNITY): Payer: Medicaid Other

## 2022-06-29 ENCOUNTER — Inpatient Hospital Stay (HOSPITAL_COMMUNITY): Payer: Medicaid Other

## 2022-06-29 DIAGNOSIS — O9902 Anemia complicating childbirth: Secondary | ICD-10-CM | POA: Diagnosis present

## 2022-06-29 DIAGNOSIS — O26893 Other specified pregnancy related conditions, third trimester: Secondary | ICD-10-CM | POA: Diagnosis present

## 2022-06-29 DIAGNOSIS — O24425 Gestational diabetes mellitus in childbirth, controlled by oral hypoglycemic drugs: Secondary | ICD-10-CM | POA: Diagnosis present

## 2022-06-29 DIAGNOSIS — O165 Unspecified maternal hypertension, complicating the puerperium: Secondary | ICD-10-CM | POA: Diagnosis present

## 2022-06-29 DIAGNOSIS — Z7982 Long term (current) use of aspirin: Secondary | ICD-10-CM

## 2022-06-29 DIAGNOSIS — O135 Gestational [pregnancy-induced] hypertension without significant proteinuria, complicating the puerperium: Secondary | ICD-10-CM | POA: Diagnosis not present

## 2022-06-29 DIAGNOSIS — O09823 Supervision of pregnancy with history of in utero procedure during previous pregnancy, third trimester: Secondary | ICD-10-CM | POA: Diagnosis not present

## 2022-06-29 DIAGNOSIS — O403XX0 Polyhydramnios, third trimester, not applicable or unspecified: Secondary | ICD-10-CM | POA: Diagnosis present

## 2022-06-29 DIAGNOSIS — J4530 Mild persistent asthma, uncomplicated: Secondary | ICD-10-CM | POA: Diagnosis present

## 2022-06-29 DIAGNOSIS — Z3A39 39 weeks gestation of pregnancy: Secondary | ICD-10-CM | POA: Diagnosis not present

## 2022-06-29 DIAGNOSIS — O322XX0 Maternal care for transverse and oblique lie, not applicable or unspecified: Secondary | ICD-10-CM | POA: Diagnosis not present

## 2022-06-29 DIAGNOSIS — O9952 Diseases of the respiratory system complicating childbirth: Secondary | ICD-10-CM | POA: Diagnosis present

## 2022-06-29 DIAGNOSIS — O3663X0 Maternal care for excessive fetal growth, third trimester, not applicable or unspecified: Secondary | ICD-10-CM | POA: Diagnosis present

## 2022-06-29 DIAGNOSIS — O99214 Obesity complicating childbirth: Secondary | ICD-10-CM | POA: Diagnosis present

## 2022-06-29 DIAGNOSIS — O24419 Gestational diabetes mellitus in pregnancy, unspecified control: Secondary | ICD-10-CM | POA: Diagnosis present

## 2022-06-29 DIAGNOSIS — D509 Iron deficiency anemia, unspecified: Secondary | ICD-10-CM | POA: Diagnosis present

## 2022-06-29 DIAGNOSIS — O0990 Supervision of high risk pregnancy, unspecified, unspecified trimester: Secondary | ICD-10-CM

## 2022-06-29 DIAGNOSIS — Z8759 Personal history of other complications of pregnancy, childbirth and the puerperium: Secondary | ICD-10-CM

## 2022-06-29 DIAGNOSIS — Z7951 Long term (current) use of inhaled steroids: Secondary | ICD-10-CM | POA: Diagnosis not present

## 2022-06-29 DIAGNOSIS — Z8632 Personal history of gestational diabetes: Secondary | ICD-10-CM | POA: Diagnosis present

## 2022-06-29 DIAGNOSIS — O24424 Gestational diabetes mellitus in childbirth, insulin controlled: Secondary | ICD-10-CM | POA: Diagnosis not present

## 2022-06-29 DIAGNOSIS — O99013 Anemia complicating pregnancy, third trimester: Secondary | ICD-10-CM | POA: Diagnosis present

## 2022-06-29 DIAGNOSIS — O24415 Gestational diabetes mellitus in pregnancy, controlled by oral hypoglycemic drugs: Secondary | ICD-10-CM | POA: Diagnosis present

## 2022-06-29 LAB — PROTEIN / CREATININE RATIO, URINE
Creatinine, Urine: 34 mg/dL
Protein Creatinine Ratio: 1.38 mg/mg{Cre} — ABNORMAL HIGH (ref 0.00–0.15)
Total Protein, Urine: 47 mg/dL

## 2022-06-29 LAB — TYPE AND SCREEN
Unit division: 0
Unit division: 0

## 2022-06-29 LAB — COMPREHENSIVE METABOLIC PANEL
ALT: 14 U/L (ref 0–44)
AST: 26 U/L (ref 15–41)
Albumin: 2.7 g/dL — ABNORMAL LOW (ref 3.5–5.0)
Alkaline Phosphatase: 195 U/L — ABNORMAL HIGH (ref 38–126)
Anion gap: 13 (ref 5–15)
BUN: 7 mg/dL (ref 6–20)
CO2: 20 mmol/L — ABNORMAL LOW (ref 22–32)
Calcium: 9.3 mg/dL (ref 8.9–10.3)
Chloride: 100 mmol/L (ref 98–111)
Creatinine, Ser: 0.79 mg/dL (ref 0.44–1.00)
GFR, Estimated: >60 mL/min (ref >60)
Glucose, Bld: 141 mg/dL — ABNORMAL HIGH (ref 70–99)
Potassium: 3.8 mmol/L (ref 3.5–5.1)
Sodium: 133 mmol/L — ABNORMAL LOW (ref 135–145)
Total Bilirubin: 0.7 mg/dL (ref 0.3–1.2)
Total Protein: 7.5 g/dL (ref 6.5–8.1)

## 2022-06-29 LAB — CBC
HCT: 35.1 % — ABNORMAL LOW (ref 36.0–46.0)
Hemoglobin: 11.4 g/dL — ABNORMAL LOW (ref 12.0–15.0)
MCH: 30.8 pg (ref 26.0–34.0)
MCHC: 32.5 g/dL (ref 30.0–36.0)
MCV: 94.9 fL (ref 80.0–100.0)
Platelets: 304 10*3/uL (ref 150–400)
RBC: 3.7 MIL/uL — ABNORMAL LOW (ref 3.87–5.11)
RDW: 15.8 % — ABNORMAL HIGH (ref 11.5–15.5)
WBC: 6.7 10*3/uL (ref 4.0–10.5)
nRBC: 0 % (ref 0.0–0.2)

## 2022-06-29 LAB — BPAM RBC

## 2022-06-29 LAB — GLUCOSE, CAPILLARY
Glucose-Capillary: 130 mg/dL — ABNORMAL HIGH (ref 70–99)
Glucose-Capillary: 86 mg/dL (ref 70–99)

## 2022-06-29 LAB — GROUP B STREP BY PCR: Group B strep by PCR: NEGATIVE

## 2022-06-29 MED ORDER — OXYCODONE-ACETAMINOPHEN 5-325 MG PO TABS: 2.0000 | ORAL_TABLET | ORAL | Status: DC | PRN

## 2022-06-29 MED ORDER — FENTANYL CITRATE (PF) 100 MCG/2ML IJ SOLN
100.0000 ug | INTRAMUSCULAR | Status: DC | PRN
  Administered 2022-06-29 – 2022-06-30 (×2): 100 ug via INTRAVENOUS
  Filled 2022-06-29 (×2): qty 2

## 2022-06-29 MED ORDER — LIDOCAINE HCL (PF) 1 % IJ SOLN
30.0000 mL | INTRAMUSCULAR | Status: AC | PRN
  Administered 2022-06-30: 30 mL via SUBCUTANEOUS
  Filled 2022-06-29: qty 30

## 2022-06-29 MED ORDER — OXYCODONE-ACETAMINOPHEN 5-325 MG PO TABS: 1.0000 | ORAL_TABLET | ORAL | Status: DC | PRN

## 2022-06-29 MED ORDER — TERBUTALINE SULFATE 1 MG/ML IJ SOLN: 0.2500 mg | Freq: Once | INTRAMUSCULAR | Status: DC | PRN

## 2022-06-29 MED ORDER — LACTATED RINGERS IV SOLN
500.0000 mL | INTRAVENOUS | Status: DC | PRN
  Administered 2022-06-29: 250 mL via INTRAVENOUS

## 2022-06-29 MED ORDER — OXYTOCIN-SODIUM CHLORIDE 30-0.9 UT/500ML-% IV SOLN: 1.0000 m[IU]/min | INTRAVENOUS | Status: DC

## 2022-06-29 MED ORDER — OXYTOCIN-SODIUM CHLORIDE 30-0.9 UT/500ML-% IV SOLN
2.5000 [IU]/h | INTRAVENOUS | Status: DC
  Filled 2022-06-29: qty 500

## 2022-06-29 MED ORDER — ONDANSETRON HCL 4 MG/2ML IJ SOLN: 4.0000 mg | Freq: Four times a day (QID) | INTRAMUSCULAR | Status: DC | PRN

## 2022-06-29 MED ORDER — SOD CITRATE-CITRIC ACID 500-334 MG/5ML PO SOLN: 30.0000 mL | ORAL | Status: DC | PRN

## 2022-06-29 MED ORDER — LACTATED RINGERS IV SOLN: INTRAVENOUS | Status: DC

## 2022-06-29 MED ORDER — ACETAMINOPHEN 325 MG PO TABS: 650.0000 mg | ORAL_TABLET | ORAL | Status: DC | PRN

## 2022-06-29 MED ORDER — HYDROXYZINE HCL 50 MG PO TABS
25.0000 mg | ORAL_TABLET | Freq: Once | ORAL | Status: AC
  Administered 2022-06-29: 25 mg via ORAL
  Filled 2022-06-29: qty 1

## 2022-06-29 MED ORDER — OXYTOCIN BOLUS FROM INFUSION
333.0000 mL | Freq: Once | INTRAVENOUS | Status: AC
  Administered 2022-06-30: 333 mL via INTRAVENOUS

## 2022-06-29 NOTE — Telephone Encounter (Signed)
The patient called back and confirmed she is aware of her induction date and plans to come in today as soon as possible.   RN charge aware.   Lavonda Jumbo, DO OB Fellow, Faculty [facility], [facility] 06/29/2022, 10:38 AM

## 2022-06-29 NOTE — H&P (Signed)
OBSTETRIC ADMISSION HISTORY AND PHYSICAL  Carolyn Alvarez is a 32 y.o. female [phone] with IUP at [redacted]w[redacted]d by LMP presenting for IOL. She reports +FMs, No LOF, no VB, no blurry vision, headaches or peripheral edema, and RUQ pain. Endorses lost of mucus plug in lobby. She plans on breast feeding. She request condoms for birth control. She received her [facility] care at  [facility]    Dating: By LMP --->  Estimated Date of Delivery: 07/04/22  Sono:    @[redacted]w[redacted]d , CWD, normal anatomy, cephalic presentation, anterior placental lie, 3173g, 87% EFW  Prenatal History/Complications:  -A2GDM (metformin 500 mg qhs) -Hx of shoulder dystocia -Asthma -Obesity -Hx of gHTN -Anemia  Past Medical History: Past Medical History:  Diagnosis Date   Asthma    Depression    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   Depression, recurrent (HCC) 11/27/2008   Eczema    History of gonorrhea 08/01/2014   Left foot pain 12/10/2020   Mild persistent asthma without complication 06/05/2020   Recurrent upper respiratory infection (URI)    Seasonal and perennial allergic rhinitis 06/05/2020   Suicidal ideation 12/31/2019   Urticaria    Past Surgical History: Past Surgical History:  Procedure Laterality Date   WISDOM TOOTH EXTRACTION     Obstetrical History: OB History     Gravida  5   Para  1   Term  1   Preterm  0   AB  3   Living  1      SAB      IAB  3   Ectopic      Multiple      Live Births             Social History Social History   Socioeconomic History   Marital status: Single    Spouse name: Not on file   Number of children: 1   Years of education: Not on file   Highest education level: Not on file  Occupational History   Not on file  Tobacco Use   Smoking status: Never    Passive exposure: Never   Smokeless tobacco: Never  Vaping Use   Vaping Use: Never used  Substance and Sexual Activity   Alcohol use: Yes    Comment: April drank 2 glasses of wine   Drug use: No    Sexual activity: Not Currently  Other Topics Concern   Not on file  Social History Narrative   Not on file   Social Determinants of Health   Financial Resource Strain: Not on file  Food Insecurity: No Food Insecurity (06/29/2022)   Hunger Vital Sign    Worried About Running Out of Food in the Last Year: Never true    Ran Out of Food in the Last Year: Never true  Transportation Needs: No Transportation Needs (06/29/2022)   PRAPARE - Administrator, Civil Service (Medical): No    Lack of Transportation (Non-Medical): No  Physical Activity: Not on file  Stress: Not on file  Social Connections: Not on file    Family History: Family History  Problem Relation Age of Onset   Asthma Mother    Hypertension Paternal Grandmother    Diabetes Paternal Grandmother    Hypertension Paternal Grandfather    Diabetes Paternal Grandfather    Cancer Paternal Grandfather    Heart disease Neg Hx     Allergies: Allergies  Allergen Reactions   Citrus Anaphylaxis   Shellfish Allergy Anaphylaxis    Medications Prior to Admission  Medication Sig Dispense Refill Last Dose   Accu-Chek Softclix Lancets lancets 1 each by Other route 4 (four) times daily. Use as instructed 120 each 12 Past Week   albuterol (PROAIR HFA) 108 (90 Base) MCG/ACT inhaler Inhale 2 puffs into the lungs every 4 (four) hours as needed for wheezing or shortness of breath. DISPENSE brand or generic which ever is preferred 8 g 1 Past Week   blood glucose meter kit and supplies KIT Please check blood sugar twice daily, once in morning before eating and once two hours after a meal. 1 each 0 Past Week   cetirizine (ZYRTEC) 10 MG tablet Take 10 mg by mouth daily.   Past Week   Ferric Maltol 30 MG CAPS Take 1 capsule (30 mg total) by mouth 2 (two) times daily. Please take one hour before breakfast and dinner 60 capsule 2 Past Week   fluticasone (FLONASE) 50 MCG/ACT nasal spray Place 1 spray into both nostrils daily. 18.2 mL 5  Past Month   fluticasone (FLOVENT HFA) 220 MCG/ACT inhaler Inhale 2 puffs into the lungs 2 (two) times daily. 1 each 12 Past Month   hydrOXYzine (ATARAX) 25 MG tablet Take 1 tablet (25 mg total) by mouth every 6 (six) hours as needed for itching or anxiety (Take 30 minutes before bed to help with sleep). 30 tablet 2 Past Week   metFORMIN (GLUCOPHAGE) 500 MG tablet Take 1 tablet (500 mg total) by mouth at bedtime. 60 tablet 5 06/28/2022   Prenatal Vit-Fe Fumarate-FA (PRENATAL VITAMINS) 28-0.8 MG TABS Take daily 30 tablet 8 06/28/2022   aspirin EC 81 MG tablet Take 1 tablet (81 mg total) by mouth daily. Swallow whole. 30 tablet 12    famotidine (PEPCID) 20 MG tablet Take 1 tablet (20 mg total) by mouth 2 (two) times daily. 60 tablet 3 Unknown   montelukast (SINGULAIR) 10 MG tablet Take 1 tablet (10 mg total) by mouth at bedtime. 30 tablet 1 More than a month     Review of Systems   All systems reviewed and negative except as stated in HPI  Blood pressure (!) 148/103, pulse 100, temperature 98 F (36.7 C), temperature source Oral, resp. rate 18, height 5\' 2"  (1.575 m), weight 114.9 kg, last menstrual period 09/27/2021, unknown if currently breastfeeding. General appearance: alert and no distress Lungs: normal effort Heart: regular rate noted Abdomen: gravid Extremities: No LE edema Presentation: cephalic Fetal monitoringBaseline: 140 bpm, Variability: Good {> 6 bpm), Accelerations: Reactive, and Decelerations: Absent Uterine activityNone     Prenatal labs: ABO, Rh: --/--/PENDING (05/20 1530) Antibody: PENDING (05/20 1530) Rubella: 3.21 (12/12 1056) RPR: Non Reactive (03/05 1044)  HBsAg: Negative (12/12 1056)  HIV: Non Reactive (03/05 1044)  GBS:    1 hr Glucola 172 Genetic screening  NIPS LR Anatomy US wnl  Prenatal Transfer Tool  Maternal Diabetes: Yes:  Diabetes Type:  Insulin/Medication controlled Genetic Screening: Normal Maternal Ultrasounds/Referrals: Normal Fetal  Ultrasounds or other Referrals:  None Maternal Substance Abuse:  No Significant Maternal Medications:  None Significant Maternal Lab Results:  Other:  GBS unknown, PCR collected Number of Prenatal Visits:greater than 3 verified prenatal visits Other Comments:   Inconsistent PNC  Results for orders placed or performed during the hospital encounter of 06/29/22 (from the past 24 hour(s))  CBC   Collection Time: 06/29/22  3:30 PM  Result Value Ref Range   WBC 6.7 4.0 - 10.5 K/uL   RBC 3.70 (L) 3.87 - 5.11 MIL/uL   Hemoglobin 11.4 (  L) 12.0 - 15.0 g/dL   HCT 46.9 (L) 62.9 - 52.8 %   MCV 94.9 80.0 - 100.0 fL   MCH 30.8 26.0 - 34.0 pg   MCHC 32.5 30.0 - 36.0 g/dL   RDW 41.3 (H) 24.4 - 01.0 %   Platelets 304 150 - 400 K/uL   nRBC 0.0 0.0 - 0.2 %  Type and screen   Collection Time: 06/29/22  3:30 PM  Result Value Ref Range   ABO/RH(D) PENDING    Antibody Screen PENDING    Sample Expiration      07/02/2022,2359 Performed at [facility], [address] [place], [place] [address]     Patient Active Problem List   Diagnosis Date Noted   Gestational diabetes 06/29/2022   History of gestational hypertension 04/14/2022   Maternal morbid obesity, antepartum (HCC) 04/14/2022   Asthma complicating pregnancy in third trimester 04/14/2022   Gestational diabetes mellitus (GDM) controlled on oral hypoglycemic drug 03/24/2022   Supervision of high-risk pregnancy 01/20/2022   Maternal iron deficiency anemia complicating pregnancy in third trimester 07/30/2014    Assessment/Plan:  Carolyn Alvarez is a 32 y.o. G5P1031 at [redacted]w[redacted]d here for IOL 2/2 A2GDM  #Labor: The patient desires a natural approach to induction. S/p AROM clear fluid. Consider the need for pitocin at next cervical exam. #Pain: maternal support #FWB: Cat I  #ID: GBS unknown, PCR collected #MOF: Breast #MOC: Unsure #Circ: Yes  A2GDM CBG 130. Had bojangles an hour prior to arrival. -Continue to monitor q4h in  latent labor, q2h in active phase -Consider the need for endotool  Hx of shoulder dystocia Tested to >7lbs. EFW is 7lbs (87% @35 /6) -Caution/Avoid use of vacuum  Elevated BP, hx of gHTN BP on admission 148/103. CBC wnl -Follow up CMP and P/C ratio  Asthma -Avoid Hemabate    Kourtni Stineman Autry-Lott, DO  06/29/2022, 4:23 PM

## 2022-06-29 NOTE — Progress Notes (Signed)
OB Note Patient with h/o shoulder dystocia 15 years ago. Baby weighed a little over 7lbs and she states she did not have GDM with that pregnancy. She states that child is doing well and no deficits. The patient currently has GDMA2 with poly and borderline LGA and and a large AC at her last u/s on 4/26: 87%, 3173gm, ac >99%, afi 28.7. Extrapolating out she is probably a little over 4000gm.   I told her I recommend a c-section given her history and last u/s findings with shoulder dystocia risks including fetal asphyxia/anoxic brain injury, brachial plexus injury, maternal injury, as well. She states that she does not want a c-section. I told her that if she changes her mind to let me know.   Cornelia Copa MD Attending Center for [employer] (Faculty Practice) 06/29/2022 Time: 2130

## 2022-06-29 NOTE — Telephone Encounter (Signed)
This provider called the number that was on file for the patient. I called to encourage the patient to come in today for her induction.   I was made aware that the patient was called yesterday by charge RN to come in a day early for induction and declined stating she does not want to come in for an induction at all.   I was made aware that the patient was called this morning by today's RN charge and there was no answer. She left a voicemail.  There was no answer when I called. A HIPAA compliant voicemail message was left asking the patient to return a call to Labor & Delivery.   Lavonda Jumbo, DO OB Fellow, Faculty [facility], Center for [facility] 06/29/2022, 10:18 AM

## 2022-06-30 ENCOUNTER — Encounter (HOSPITAL_COMMUNITY): Payer: Self-pay | Admitting: Family Medicine

## 2022-06-30 DIAGNOSIS — O99214 Obesity complicating childbirth: Secondary | ICD-10-CM

## 2022-06-30 DIAGNOSIS — O322XX0 Maternal care for transverse and oblique lie, not applicable or unspecified: Secondary | ICD-10-CM

## 2022-06-30 DIAGNOSIS — O24424 Gestational diabetes mellitus in childbirth, insulin controlled: Secondary | ICD-10-CM

## 2022-06-30 DIAGNOSIS — Z3A39 39 weeks gestation of pregnancy: Secondary | ICD-10-CM

## 2022-06-30 DIAGNOSIS — O3663X0 Maternal care for excessive fetal growth, third trimester, not applicable or unspecified: Secondary | ICD-10-CM

## 2022-06-30 DIAGNOSIS — O135 Gestational [pregnancy-induced] hypertension without significant proteinuria, complicating the puerperium: Secondary | ICD-10-CM

## 2022-06-30 DIAGNOSIS — O403XX0 Polyhydramnios, third trimester, not applicable or unspecified: Secondary | ICD-10-CM

## 2022-06-30 DIAGNOSIS — O09823 Supervision of pregnancy with history of in utero procedure during previous pregnancy, third trimester: Secondary | ICD-10-CM

## 2022-06-30 LAB — COMPREHENSIVE METABOLIC PANEL
ALT: 15 U/L (ref 0–44)
AST: 34 U/L (ref 15–41)
Albumin: 2.2 g/dL — ABNORMAL LOW (ref 3.5–5.0)
Alkaline Phosphatase: 168 U/L — ABNORMAL HIGH (ref 38–126)
Anion gap: 9 (ref 5–15)
BUN: 5 mg/dL — ABNORMAL LOW (ref 6–20)
CO2: 22 mmol/L (ref 22–32)
Calcium: 8.7 mg/dL — ABNORMAL LOW (ref 8.9–10.3)
Chloride: 100 mmol/L (ref 98–111)
Creatinine, Ser: 0.9 mg/dL (ref 0.44–1.00)
GFR, Estimated: >60 mL/min (ref >60)
Glucose, Bld: 212 mg/dL — ABNORMAL HIGH (ref 70–99)
Potassium: 3.8 mmol/L (ref 3.5–5.1)
Sodium: 131 mmol/L — ABNORMAL LOW (ref 135–145)
Total Bilirubin: 0.5 mg/dL (ref 0.3–1.2)
Total Protein: 6.1 g/dL — ABNORMAL LOW (ref 6.5–8.1)

## 2022-06-30 LAB — CBC
HCT: 31.5 % — ABNORMAL LOW (ref 36.0–46.0)
Hemoglobin: 10.2 g/dL — ABNORMAL LOW (ref 12.0–15.0)
MCH: 31.1 pg (ref 26.0–34.0)
MCHC: 32.4 g/dL (ref 30.0–36.0)
MCV: 96 fL (ref 80.0–100.0)
Platelets: 258 10*3/uL (ref 150–400)
RBC: 3.28 MIL/uL — ABNORMAL LOW (ref 3.87–5.11)
RDW: 15.7 % — ABNORMAL HIGH (ref 11.5–15.5)
WBC: 12.5 10*3/uL — ABNORMAL HIGH (ref 4.0–10.5)
nRBC: 0 % (ref 0.0–0.2)

## 2022-06-30 LAB — TYPE AND SCREEN

## 2022-06-30 LAB — GLUCOSE, CAPILLARY: Glucose-Capillary: 124 mg/dL — ABNORMAL HIGH (ref 70–99)

## 2022-06-30 LAB — RPR: RPR Ser Ql: NONREACTIVE

## 2022-06-30 LAB — BPAM RBC
Blood Product Expiration Date: 202406212359
Blood Product Expiration Date: 202406212359

## 2022-06-30 MED ORDER — SENNOSIDES-DOCUSATE SODIUM 8.6-50 MG PO TABS
2.0000 | ORAL_TABLET | Freq: Every day | ORAL | Status: DC
  Administered 2022-06-30: 2 via ORAL
  Filled 2022-06-30: qty 2

## 2022-06-30 MED ORDER — FUROSEMIDE 20 MG PO TABS
20.0000 mg | ORAL_TABLET | Freq: Every day | ORAL | Status: DC
  Administered 2022-06-30 – 2022-07-01 (×2): 20 mg via ORAL
  Filled 2022-06-30 (×2): qty 1

## 2022-06-30 MED ORDER — NIFEDIPINE ER OSMOTIC RELEASE 30 MG PO TB24
30.0000 mg | ORAL_TABLET | Freq: Every day | ORAL | Status: DC
  Administered 2022-06-30 – 2022-07-01 (×2): 30 mg via ORAL
  Filled 2022-06-30 (×2): qty 1

## 2022-06-30 MED ORDER — DIBUCAINE (PERIANAL) 1 % EX OINT: 1.0000 | TOPICAL_OINTMENT | CUTANEOUS | Status: DC | PRN

## 2022-06-30 MED ORDER — BENZOCAINE-MENTHOL 20-0.5 % EX AERO
1.0000 | INHALATION_SPRAY | CUTANEOUS | Status: DC | PRN
  Administered 2022-06-30: 1 via TOPICAL
  Filled 2022-06-30: qty 56

## 2022-06-30 MED ORDER — LABETALOL HCL 5 MG/ML IV SOLN: 40.0000 mg | INTRAVENOUS | Status: DC | PRN

## 2022-06-30 MED ORDER — COCONUT OIL OIL: 1.0000 | TOPICAL_OIL | Status: DC | PRN

## 2022-06-30 MED ORDER — ONDANSETRON HCL 4 MG/2ML IJ SOLN: 4.0000 mg | INTRAMUSCULAR | Status: DC | PRN

## 2022-06-30 MED ORDER — ACETAMINOPHEN 325 MG PO TABS: 650.0000 mg | ORAL_TABLET | ORAL | Status: DC | PRN

## 2022-06-30 MED ORDER — ONDANSETRON HCL 4 MG PO TABS: 4.0000 mg | ORAL_TABLET | ORAL | Status: DC | PRN

## 2022-06-30 MED ORDER — LABETALOL HCL 5 MG/ML IV SOLN
20.0000 mg | INTRAVENOUS | Status: DC | PRN
  Administered 2022-06-30: 20 mg via INTRAVENOUS
  Filled 2022-06-30: qty 4

## 2022-06-30 MED ORDER — LABETALOL HCL 5 MG/ML IV SOLN: 80.0000 mg | INTRAVENOUS | Status: DC | PRN

## 2022-06-30 MED ORDER — OXYCODONE HCL 5 MG PO TABS
5.0000 mg | ORAL_TABLET | Freq: Four times a day (QID) | ORAL | Status: DC | PRN
  Administered 2022-07-01: 5 mg via ORAL
  Filled 2022-06-30: qty 1

## 2022-06-30 MED ORDER — MAGNESIUM SULFATE 40 GM/1000ML IV SOLN
2.0000 g/h | INTRAVENOUS | Status: AC
  Administered 2022-06-30 (×2): 2 g/h via INTRAVENOUS
  Filled 2022-06-30 (×2): qty 1000

## 2022-06-30 MED ORDER — LACTATED RINGERS IV SOLN: INTRAVENOUS | Status: DC

## 2022-06-30 MED ORDER — HYDRALAZINE HCL 20 MG/ML IJ SOLN
5.0000 mg | INTRAMUSCULAR | Status: DC | PRN
  Administered 2022-06-30: 5 mg via INTRAVENOUS
  Filled 2022-06-30: qty 1

## 2022-06-30 MED ORDER — SIMETHICONE 80 MG PO CHEW
80.0000 mg | CHEWABLE_TABLET | ORAL | Status: DC | PRN
  Administered 2022-07-01: 80 mg via ORAL
  Filled 2022-06-30: qty 1

## 2022-06-30 MED ORDER — POLYETHYLENE GLYCOL 3350 17 G PO PACK
17.0000 g | PACK | Freq: Every day | ORAL | Status: DC
  Administered 2022-06-30: 17 g via ORAL
  Filled 2022-06-30: qty 1

## 2022-06-30 MED ORDER — IBUPROFEN 600 MG PO TABS
600.0000 mg | ORAL_TABLET | Freq: Four times a day (QID) | ORAL | Status: DC
  Administered 2022-06-30 – 2022-07-01 (×6): 600 mg via ORAL
  Filled 2022-06-30 (×6): qty 1

## 2022-06-30 MED ORDER — PRENATAL MULTIVITAMIN CH
1.0000 | ORAL_TABLET | Freq: Every day | ORAL | Status: DC
  Administered 2022-06-30 – 2022-07-01 (×2): 1 via ORAL
  Filled 2022-06-30 (×2): qty 1

## 2022-06-30 MED ORDER — CEFAZOLIN SODIUM-DEXTROSE 2-4 GM/100ML-% IV SOLN
2.0000 g | Freq: Once | INTRAVENOUS | Status: AC
  Administered 2022-06-30: 2 g via INTRAVENOUS
  Filled 2022-06-30: qty 100

## 2022-06-30 MED ORDER — LABETALOL HCL 5 MG/ML IV SOLN: 20.0000 mg | INTRAVENOUS | Status: DC | PRN

## 2022-06-30 MED ORDER — HYDRALAZINE HCL 20 MG/ML IJ SOLN: 10.0000 mg | INTRAMUSCULAR | Status: DC | PRN

## 2022-06-30 MED ORDER — TETANUS-DIPHTH-ACELL PERTUSSIS 5-2.5-18.5 LF-MCG/0.5 IM SUSY: 0.5000 mL | PREFILLED_SYRINGE | Freq: Once | INTRAMUSCULAR | Status: DC

## 2022-06-30 MED ORDER — MAGNESIUM SULFATE BOLUS VIA INFUSION
6.0000 g | Freq: Once | INTRAVENOUS | Status: AC
  Administered 2022-06-30: 6 g via INTRAVENOUS
  Filled 2022-06-30: qty 1000

## 2022-06-30 MED ORDER — WITCH HAZEL-GLYCERIN EX PADS: 1.0000 | MEDICATED_PAD | CUTANEOUS | Status: DC | PRN

## 2022-06-30 MED ORDER — OXYTOCIN-SODIUM CHLORIDE 30-0.9 UT/500ML-% IV SOLN: 2.5000 [IU]/h | INTRAVENOUS | Status: DC | PRN

## 2022-06-30 MED ORDER — DIPHENHYDRAMINE HCL 25 MG PO CAPS: 25.0000 mg | ORAL_CAPSULE | Freq: Four times a day (QID) | ORAL | Status: DC | PRN

## 2022-06-30 NOTE — Lactation Note (Signed)
This note was copied from a baby's chart. Lactation Consultation Note  Patient Name: Carolyn Alvarez [place] Today's Date: 06/30/2022 Age:34 hours Reason for consult: Initial assessment;Term;Maternal endocrine disorder  The infant was at 8 hours old.  The birth parent said that she has tried to feed the infant but he was unable to latch.  LC put a infant blanket under the birth parent's breast to help lift the breast to make it easier to latch.  LC assisted the birth parent with putting the infant to both breasts.  The infant latched with his tongue down, lips flanged, sucking was rhythmic, and some jaw extensions were noted.  The birth parent stated that the her nipples were sensitive due to having taken out her nipple piercing.  LC reviewed assembling, disassembling, and washing pump parts.  The birth parent was falling asleep so she may need a refresher.  LC reviewed pumping frequency.  The birth parent knows to pump after every other feeding.  LC reviewed milk storage guidelines.  The birth parent had no further questions or concerns.  LC swaddled the infant and placed him in the bassinet.  LC left her name on the board and encouraged the birth parent to call for assistance with breastfeeding.   Infant Feeding Plan:  Breastfeed 8+ times in 24 hours according to feeding cues.  Pump after every other feeding to help increase milk supply while on magnesium.  Call RN/LC for assistance with breastfeeding.    Maternal Data Has patient been taught Hand Expression?: Yes Does the patient have breastfeeding experience prior to this delivery?: Yes How long did the patient breastfeed?: 14 months  Feeding Mother's Current Feeding Choice: Breast Milk  LATCH Score Latch: Grasps breast easily, tongue down, lips flanged, rhythmical sucking.  Audible Swallowing: Spontaneous and intermittent  Type of Nipple: Everted at rest and after stimulation  Comfort (Breast/Nipple): Soft /  non-tender  Hold (Positioning): Assistance needed to correctly position infant at breast and maintain latch.  LATCH Score: 9   Lactation Tools Discussed/Used    Interventions Interventions: Assisted with latch;Adjust position;Support pillows;Education;LC Services brochure  Discharge Pump: Personal;Hands Free  Consult Status Consult Status: Follow-up Date: 07/01/22 Follow-up type: In-patient    Delene Loll 06/30/2022, 9:54 AM

## 2022-06-30 NOTE — Progress Notes (Signed)
Attending Circumcision Counseling Progress Note  Patient desires circumcision for her female infant.  Circumcision procedure details discussed, risks and benefits of procedure were also discussed.  These include but are not limited to: Benefits of circumcision in men include reduction in the rates of urinary tract infection (UTI), penile cancer, some sexually transmitted infections, penile inflammatory and retractile disorders, as well as easier hygiene.  Risks include bleeding , infection, injury of glans which may lead to penile deformity or urinary tract issues, unsatisfactory cosmetic appearance and other potential complications related to the procedure.  It was emphasized that this is an elective procedure.  Patient wants to proceed with circumcision; written informed consent obtained.  Will do circumcision soon, routine circumcision and post circumcision care ordered for the infant.  Warden Fillers, MD 06/30/2022 10:16 AM     Circumcision Consent (to put in baby's chart)   Discussed with mom at bedside about circumcision.    Circumcision is a surgery that removes the skin that covers the tip of the penis, called the "foreskin." Circumcision is usually done when a boy is between 70 and 53 days old, sometimes up to 57-44 weeks old.   The most common reasons boys are circumcised include for cultural/religious beliefs or for parental preference (potentially easier to clean, so baby looks like daddy, etc).  It was emphasized that this is an elective procedure.    There may be some medical benefits for circumcision:    Circumcised boys seem to have slightly lower rates of: ? Urinary tract infections (per the American Academy of Pediatrics an uncircumcised boy has a 1/100 chance of developing a UTI in the first year of life, a circumcised boy at a 02/998 chance of developing a UTI in the first year of life- a 10% reduction) ? Penis cancer (typically rare- an uncircumcised female has a 1 in 100,000  chance of developing cancer of the penis) ? Sexually transmitted infection (in endemic areas, including HIV, HPV and Herpes- circumcision does NOT protect against gonorrhea, chlamydia, trachomatis, or syphilis) ? Phimosis: a condition where that makes retraction of the foreskin over the glans impossible (0.4 per 1000 boys per year or 0.6% of boys are affected by their 15th birthday)   Boys and men who are not circumcised can reduce these extra risks by: ? Cleaning their penis well ? Using condoms during sex   What are the risks of circumcision?   As with any surgical procedure, there are risks and complications. In circumcision, complications are rare and usually minor, the most common being: ? Bleeding- risk is reduced by holding each clamp for 30 seconds prior to a cut being made, and by holding pressure after the procedure is done ? Infection- the penis is cleaned prior to the procedure, and the procedure is done under sterile technique ? Damage to the urethra or amputation of the penis which may lead to penile deformity or urinary tract issues and Urology intervention ? Unsatisfactory cosmetic appearance     How is circumcision done in baby boys?   The baby will be placed on a special table and the legs restrained for their safety. Numbing medication is injected into the penis, and the skin is cleansed with betadine to decrease the risk of infection.    What to expect:   The penis will look red and raw for 5-7 days as it heals. We expect scabbing around where the cut was made, as well as clear-pink fluid and some swelling of the penis  right after the procedure. If your baby's circumcision starts to bleed or develops pus, please contact your pediatrician immediately.   All questions were answered and mother consented. Routine circumcision and post circumcision care orders placed for the infant.  Will plan for circ tomorrow as infant has yet to void.     Warden Fillers, MD

## 2022-06-30 NOTE — Discharge Summary (Signed)
Postpartum Discharge Summary  Date of Service updated: 07/01/22     Patient Name: Carolyn Alvarez DOB: 1990-08-24 MRN: [id number]  Date of admission: 06/29/2022 Delivery date:06/30/2022  Delivering provider: Celedonio Savage  Date of discharge: 07/01/2022  Admitting diagnosis: Gestational diabetes [O24.419] Intrauterine pregnancy: [redacted]w[redacted]d     Secondary diagnosis:  Principal Problem:   Gestational diabetes Active Problems:   Maternal iron deficiency anemia complicating pregnancy in third trimester   Supervision of high-risk pregnancy   Gestational diabetes mellitus (GDM) controlled on oral hypoglycemic drug   Maternal morbid obesity, antepartum (HCC)   History of shoulder dystocia in prior pregnancy   Macrosomia   Shoulder dystocia during labor and delivery, delivered   Shoulder dystocia during labor and delivery   Vaginal delivery  Additional problems: postpartum hypertension    Discharge diagnosis: Term Pregnancy Delivered                                              Post partum procedures: n/a Augmentation: AROM and Pitocin Complications: 2 min Shoulder dystocia   Hospital course: Induction of Labor With Vaginal Delivery   32 y.o. yo [id number] at [redacted]w[redacted]d was admitted to the hospital 06/29/2022 for induction of labor.  Indication for induction: A2 DM and LGA .  Patient had an labor course complicated by 2 minute shoulder dystocia requiring McRoberts, suprapubic pressure, delivery of posterior arm  Membrane Rupture Time/Date: 4:44 PM ,06/29/2022   Delivery Method:Vaginal, Spontaneous  Episiotomy: Right Mediolateral  Lacerations:  Periurethral;3rd degree  Details of delivery can be found in separate delivery note.  Patient had a postpartum course complicated by some postpartum hypertension.  Pt received postpartum magnesium sulfate and was started on anti-hypertensive. Patient is discharged home 07/01/22.  Newborn Data: Birth date:06/30/2022  Birth time:1:34 AM  Gender:Female   Living status:Living  Apgars:7 ,9  Weight:4210 g   Magnesium Sulfate received: No BMZ received: No Rhophylac:N/A MMR:N/A T-DaP: ordered postpartum Flu: N/A Transfusion:No  Physical exam  Vitals:   07/01/22 0100 07/01/22 0200 07/01/22 0348 07/01/22 0831  BP:   116/69 109/69  Pulse:   81 86  Resp: 18 16 16 17   Temp:   97.8 F (36.6 C) (!) 97.4 F (36.3 C)  TempSrc:   Oral Oral  SpO2:   100% 98%  Weight:      Height:       General: alert, cooperative, and no distress Lochia: appropriate Uterine Fundus: firm Incision: N/A DVT Evaluation: No evidence of DVT seen on physical exam. Negative Homan's sign. CV: R/R/R Lungs :  CTA bilat Labs: Lab Results  Component Value Date   WBC 12.5 (H) 06/30/2022   HGB 10.2 (L) 06/30/2022   HCT 31.5 (L) 06/30/2022   MCV 96.0 06/30/2022   PLT 258 06/30/2022      Latest Ref Rng & Units 06/30/2022    7:00 AM  CMP  Glucose 70 - 99 mg/dL 147   BUN 6 - 20 mg/dL 5   Creatinine 8.29 - 5.62 mg/dL 1.30   Sodium 865 - 784 mmol/L 131   Potassium 3.5 - 5.1 mmol/L 3.8   Chloride 98 - 111 mmol/L 100   CO2 22 - 32 mmol/L 22   Calcium 8.9 - 10.3 mg/dL 8.7   Total Protein 6.5 - 8.1 g/dL 6.1   Total Bilirubin 0.3 - 1.2 mg/dL 0.5  Alkaline Phos 38 - 126 U/L 168   AST 15 - 41 U/L 34   ALT 0 - 44 U/L 15    Edinburgh Score:    06/30/2022    6:00 PM  Edinburgh Postnatal Depression Scale Screening Tool  I have been able to laugh and see the funny side of things. 1  I have looked forward with enjoyment to things. 1  I have blamed myself unnecessarily when things went wrong. 2  I have been anxious or worried for no good reason. 2  I have felt scared or panicky for no good reason. 2  Things have been getting on top of me. 2  I have been so unhappy that I have had difficulty sleeping. 0  I have felt sad or miserable. 1  I have been so unhappy that I have been crying. 1  The thought of harming myself has occurred to me. 0  Edinburgh Postnatal  Depression Scale Total 12     After visit meds:  Allergies as of 07/01/2022       Reactions   Citrus Anaphylaxis   Shellfish Allergy Anaphylaxis        Medication List     STOP taking these medications    Accu-Chek Softclix Lancets lancets   aspirin EC 81 MG tablet   blood glucose meter kit and supplies Kit   cetirizine 10 MG tablet Commonly known as: ZYRTEC   fluticasone 220 MCG/ACT inhaler Commonly known as: Flovent HFA   metFORMIN 500 MG tablet Commonly known as: GLUCOPHAGE   Prenatal Vitamins 28-0.8 MG Tabs       TAKE these medications    albuterol 108 (90 Base) MCG/ACT inhaler Commonly known as: ProAir HFA Inhale 2 puffs into the lungs every 4 (four) hours as needed for wheezing or shortness of breath. DISPENSE brand or generic which ever is preferred   famotidine 20 MG tablet Commonly known as: PEPCID Take 1 tablet (20 mg total) by mouth 2 (two) times daily.   Ferric Maltol 30 MG Caps Take 1 capsule (30 mg total) by mouth 2 (two) times daily. Please take one hour before breakfast and dinner   fluticasone 50 MCG/ACT nasal spray Commonly known as: FLONASE Place 1 spray into both nostrils daily.   furosemide 20 MG tablet Commonly known as: LASIX Take 1 tablet (20 mg total) by mouth daily. Start taking on: Jul 02, 2022   hydrOXYzine 25 MG tablet Commonly known as: ATARAX Take 1 tablet (25 mg total) by mouth every 6 (six) hours as needed for itching or anxiety (Take 30 minutes before bed to help with sleep).   ibuprofen 600 MG tablet Commonly known as: ADVIL Take 1 tablet (600 mg total) by mouth every 6 (six) hours.   montelukast 10 MG tablet Commonly known as: Singulair Take 1 tablet (10 mg total) by mouth at bedtime.   NIFEdipine 30 MG 24 hr tablet Commonly known as: ADALAT CC Take 1 tablet (30 mg total) by mouth daily. Start taking on: Jul 02, 2022         Discharge home in stable condition Infant Feeding: Breast Infant  Disposition:home with mother Discharge instruction: per After Visit Summary and Postpartum booklet. Activity: Advance as tolerated. Pelvic rest for 6 weeks.  Diet: carb modified diet Future Appointments: Future Appointments  Date Time Provider Department Center  08/11/2022  8:15 AM [facility]  08/11/2022  8:35 AM  Bing, MD [facility]   Follow up Visit:  Follow-up  Information     [facility] for [facility] at [facility]. Schedule an appointment as soon as possible for a visit in 1 week(s).   Specialty: Obstetrics and Gynecology Why: BP check Contact information: [address] [place] [address] [phone]        [facility] for [facility] at [facility]. Schedule an appointment as soon as possible for a visit in 4 week(s).   Specialty: Obstetrics and Gynecology Why: postpartum appt Contact information: [address] [place] [address] [phone]               Message sent   Please schedule this patient for a In person postpartum visit in 6 weeks with the following provider: Any provider. Additional Postpartum F/U:2 hour GTT  High risk pregnancy complicated by: GDM Delivery mode:  Vaginal, Spontaneous  Anticipated Birth Control:   discuss at [facility] visit    07/01/2022 Warden Fillers, MD

## 2022-06-30 NOTE — Progress Notes (Signed)
Chaplain responded to neonatal Code Blue.  Baby was declared healthy and well; no need for Chaplain at this time.  Vernell Morgans Chaplain

## 2022-06-30 NOTE — Progress Notes (Signed)
POSTPARTUM PROGRESS NOTE  Post Partum Day : 1  Subjective:  Carolyn Alvarez is a 32 y.o. [id number] s/p SVD at [redacted]w[redacted]d.  She reports she is doing well. No acute events overnight. She denies any problems with ambulating, voiding or po intake. Denies nausea or vomiting.  Pain is well controlled.  Lochia is normal.  Pt did have a 2 minute social dystocia.  Objective: Blood pressure (!) 145/79, pulse (!) 103, temperature 98 F (36.7 C), temperature source Oral, resp. rate 19, height 5\' 2"  (1.575 m), weight 114.9 kg, last menstrual period 09/27/2021, SpO2 99 %, unknown if currently breastfeeding.  Physical Exam:  General: alert, cooperative and no distress Chest: no respiratory distress Heart: regular rate and rhythm Abdomen: soft, nontender, nondistended, obese Uterine Fundus: firm, appropriately tender DVT Evaluation: No calf swelling or tenderness, negative Holman's sign Extremities: trace edema Skin: warm, dry  Recent Labs    06/29/22 1530 06/30/22 0700  HGB 11.4* 10.2*  HCT 35.1* 31.5*    Assessment/Plan: Carolyn Alvarez is a 32 y.o. [id number] s/p SVD at [redacted]w[redacted]d   PPD#1 - Doing well  Routine postpartum care Infant verbally consented for circ, mother needs to sign consnet, plan for circ 07/01/22 Contraception: unsure Feeding: breast Dispo: Plan for discharge on 07/01/22 if no issues..   LOS: 1 day   Mariel Aloe, MD Faculty attending 06/30/2022, 10:17 AM

## 2022-07-01 ENCOUNTER — Telehealth: Payer: Self-pay

## 2022-07-01 ENCOUNTER — Other Ambulatory Visit (HOSPITAL_COMMUNITY): Payer: Self-pay

## 2022-07-01 LAB — BIRTH TISSUE RECOVERY COLLECTION (PLACENTA DONATION)

## 2022-07-01 LAB — GLUCOSE, CAPILLARY: Glucose-Capillary: 92 mg/dL (ref 70–99)

## 2022-07-01 MED ORDER — NIFEDIPINE ER 30 MG PO TB24
30.0000 mg | ORAL_TABLET | Freq: Every day | ORAL | 1 refills | Status: DC
  Filled 2022-07-01 – 2022-08-04 (×2): qty 30, 30d supply, fill #0

## 2022-07-01 MED ORDER — IBUPROFEN 600 MG PO TABS
600.0000 mg | ORAL_TABLET | Freq: Four times a day (QID) | ORAL | 1 refills | Status: DC
  Filled 2022-07-01 – 2022-08-04 (×2): qty 30, 8d supply, fill #0

## 2022-07-01 MED ORDER — FUROSEMIDE 20 MG PO TABS
20.0000 mg | ORAL_TABLET | Freq: Every day | ORAL | 0 refills | Status: DC
  Filled 2022-07-01: qty 4, 4d supply, fill #0

## 2022-07-01 NOTE — Telephone Encounter (Signed)
Pt called in to schedule her PP visit.  ?

## 2022-07-01 NOTE — Clinical Social Work Maternal (Signed)
CLINICAL SOCIAL WORK MATERNAL/CHILD NOTE  Patient Details  Name: Carolyn Alvarez MRN: [id number] Date of Birth: 27-Dec-1990  Date:  07/01/2022  Clinical Social Worker Initiating Note:  Celso Sick[place], [place] Date/Time: Initiated:  07/01/22/1138     Child's Name:  Carolyn Alvarez   Biological Parents:  Mother, Father (Father: Debara Pickett)   Need for Interpreter:  None   Reason for Referral:  Behavioral Health Concerns (EDPS:12)   Address:  [address] [place] [address] [place] [address]    Phone number:  [phone] (home)     Additional phone number:   Household Members/Support Persons (HM/SP):   Household Member/Support Person 1   HM/SP Name Relationship DOB or Age  HM/SP -1 Duwayne Heck Pegues daughter 34 years old  HM/SP -2        HM/SP -3        HM/SP -4        HM/SP -5        HM/SP -6        HM/SP -7        HM/SP -8          Natural Supports (not living in the home):  Parent   Professional Supports: None   Employment: Part-time   Type of Work: Web designer; Field seismologist   Education:  Engineer, maintenance (IT)   Homebound arranged:    Surveyor, quantity Resources:  Medicaid   Other Resources:  [facility]   Cultural/Religious Considerations Which May Impact Care:    Strengths:  Ability to meet basic needs  , Home prepared for child     Psychotropic Medications:         Pediatrician:       Pediatrician List:   Radiographer, therapeutic    [place]    [facility]      Pediatrician Fax Number:    Risk Factors/Current Problems:  Mental Health Concerns     Cognitive State:  Able to Concentrate  , Alert  , Linear Thinking  , Goal Oriented  , Insightful     Mood/Affect:  Calm  , Happy  , Comfortable  , Interested     CSW Assessment: CSW met with MOB at bedside to complete psychosocial assessment. CSW introduced self and explained reason for consult. MOB was welcoming, open, and remained  engaged during assessment. MOB reported that she resides with her daughter and is currently not working. MOB reported that she receives [facility] and has items needed to care for infant including a car seat and bedside bassinet. CSW inquired about MOB's support system, MOB reported that her support system resides outside of [place].   CSW inquired about MOB's mental health history. MOB reported that she was experienced postpartum depression 15 years ago after having her daughter. MOB described her postpartum depression as not wanting to care for her daughter. MOB reported that she was residing with her mother at the time and allowed her mother to care for infant. MOB reported that she participated in therapy and took medication to treat postpartum depression. MOB reported that her postpartum depression transitioned to generalized depression. MOB reported that she was also diagnosed with Manic Bipolar Disorder 2-3 years ago. MOB reported that she feels this is an accurate diagnosis. MOB reported that her most recent manic episode was June 2023. MOB described her manic episode as not sleeping, overspending, and not doing things that she needs to do. CSW  inquired about MOB's treatment, MOB reported that she took herself off medication a year ago because she didn't like the way it made her feel. MOB reported that she was doing therapy at [facility] until her therapist left the agency. MOB reported that she was interested in doing therapy again. CSW provided MOB with local mental health resources and encouraged MOB to follow up. CSW informed MOB about the Healthy Start and Child First program as they have in home therapy components. MOB reported that she was interested and agreeable to a referral, CSW agreed to complete. CSW and MOB discussed elevated edinburgh score 12. CSW encouraged MOB to keep a close eye on PPD symptoms.  CSW inquired about how MOB was feeling emotionally since giving  birth, MOB reported that she was feeling off and on. CSW asked MOB to explain the phrase. MOB reported that she is excited while also adjusting to the idea of mothering a newborn again as it's been fifteen years. MOB shared that she and FOB also decided to split. CSW acknowledged, validated, and normalized MOB's feelings. CSW provided encouraging words. MOB presented calm and did not demonstrate any acute mental health signs/symptoms. CSW assessed for safety, MOB denied SI, HI, and domestic violence. MOB denied auditory/visual hallucinations in relation to her Bipolar disorder.   CSW provided education regarding the baby blues period vs. perinatal mood disorders, discussed treatment and gave resources for mental health follow up if concerns arise.  CSW recommends self-evaluation during the postpartum time period using the New Mom Checklist from Postpartum Progress and encouraged MOB to contact a medical professional if symptoms are noted at any time.    CSW provided review of Sudden Infant Death Syndrome (SIDS) precautions.    CSW identifies no further need for intervention and no barriers to discharge at this time.  CSW will complete Healthy Start and Child First program referrals. MOB denied needing any additional resources.   CSW Plan/Description:  Sudden Infant Death Syndrome (SIDS) Education, Perinatal Mood and Anxiety Disorder (PMADs) Education, No Further Intervention Required/No Barriers to Discharge, Other Information/Referral to [employer], [place] 07/01/2022, 12:15 PM

## 2022-07-01 NOTE — Lactation Note (Signed)
This note was copied from a baby's chart. Lactation Consultation Note  Patient Name: Carolyn Alvarez [place] Today's Date: 07/01/2022 Age:32 years Reason for consult: Follow-up assessment;Term;Infant weight loss;Breastfeeding assistance (4.04% WL)  The infant was at 32 years old. LC entered the room and the birth parent was feeding the infant at her left breast.  Per the birth parent things have been going well with breastfeeding.  The birth parent said that the infant has been feeding a lot and she started supplementing.  LC reminded the birth parent about cluster feeding.  LC reviewed pumping, engorgement, breast care, warning signs, mastitis, and infant I/O.  All questions were answered.   Infant Feeding Plan:  Breastfeed 8+ times in 24 hours according to feeding cues.  Put the infant to the breast prior to supplementing with formula.  Supplement according to supplementation guidelines.  Watch infant output and call the pediatrician with concerns.  Call the outpatient lactation consultant with concerns about breastfeeding.  Prioritize maternal rest, hydration, and nutrition.   Feeding Mother's Current Feeding Choice: Breast Milk and Formula  LATCH Score Latch: Grasps breast easily, tongue down, lips flanged, rhythmical sucking.  Audible Swallowing: A few with stimulation  Type of Nipple: Everted at rest and after stimulation  Comfort (Breast/Nipple): Soft / non-tender  Hold (Positioning): No assistance needed to correctly position infant at breast.  LATCH Score: 9  Interventions Interventions: Education  Discharge Discharge Education: Engorgement and breast care;Warning signs for feeding baby;Outpatient recommendation  Consult Status Consult Status: Complete Date: 07/01/22 Follow-up type: Call as needed   Delene Loll 07/01/2022, 12:00 PM

## 2022-07-01 NOTE — Plan of Care (Signed)
Problem: Education: Goal: Knowledge of General Education information will improve Description: Including pain rating scale, medication(s)/side effects and non-pharmacologic comfort measures Outcome: Adequate for Discharge   Problem: Health Behavior/Discharge Planning: Goal: Ability to manage health-related needs will improve Outcome: Adequate for Discharge   Problem: Clinical Measurements: Goal: Ability to maintain clinical measurements within normal limits will improve Outcome: Adequate for Discharge Goal: Will remain free from infection Outcome: Adequate for Discharge Goal: Diagnostic test results will improve Outcome: Adequate for Discharge Goal: Respiratory complications will improve Outcome: Adequate for Discharge Goal: Cardiovascular complication will be avoided Outcome: Adequate for Discharge   Problem: Activity: Goal: Risk for activity intolerance will decrease Outcome: Adequate for Discharge   Problem: Nutrition: Goal: Adequate nutrition will be maintained Outcome: Adequate for Discharge   Problem: Coping: Goal: Level of anxiety will decrease Outcome: Adequate for Discharge   Problem: Elimination: Goal: Will not experience complications related to bowel motility Outcome: Adequate for Discharge Goal: Will not experience complications related to urinary retention Outcome: Adequate for Discharge   Problem: Pain Managment: Goal: General experience of comfort will improve Outcome: Adequate for Discharge   Problem: Safety: Goal: Ability to remain free from injury will improve Outcome: Adequate for Discharge   Problem: Skin Integrity: Goal: Risk for impaired skin integrity will decrease Outcome: Adequate for Discharge   Problem: Education: Goal: Knowledge of Childbirth will improve Outcome: Adequate for Discharge Goal: Ability to make informed decisions regarding treatment and plan of care will improve Outcome: Adequate for Discharge Goal: Ability to state  and carry out methods to decrease the pain will improve Outcome: Adequate for Discharge Goal: Individualized Educational Video(s) Outcome: Adequate for Discharge   Problem: Coping: Goal: Ability to verbalize concerns and feelings about labor and delivery will improve Outcome: Adequate for Discharge   Problem: Life Cycle: Goal: Ability to make normal progression through stages of labor will improve Outcome: Adequate for Discharge Goal: Ability to effectively push during vaginal delivery will improve Outcome: Adequate for Discharge   Problem: Role Relationship: Goal: Will demonstrate positive interactions with the child Outcome: Adequate for Discharge   Problem: Safety: Goal: Risk of complications during labor and delivery will decrease Outcome: Adequate for Discharge   Problem: Pain Management: Goal: Relief or control of pain from uterine contractions will improve Outcome: Adequate for Discharge   Problem: Education: Goal: Knowledge of condition will improve Outcome: Adequate for Discharge Goal: Individualized Educational Video(s) Outcome: Adequate for Discharge Goal: Individualized Newborn Educational Video(s) Outcome: Adequate for Discharge   Problem: Activity: Goal: Will verbalize the importance of balancing activity with adequate rest periods Outcome: Adequate for Discharge Goal: Ability to tolerate increased activity will improve Outcome: Adequate for Discharge   Problem: Coping: Goal: Ability to identify and utilize available resources and services will improve Outcome: Adequate for Discharge   Problem: Life Cycle: Goal: Chance of risk for complications during the postpartum period will decrease Outcome: Adequate for Discharge   Problem: Role Relationship: Goal: Ability to demonstrate positive interaction with newborn will improve Outcome: Adequate for Discharge   Problem: Skin Integrity: Goal: Demonstration of wound healing without infection will  improve Outcome: Adequate for Discharge   Problem: Education: Goal: Knowledge of disease or condition will improve Outcome: Adequate for Discharge Goal: Knowledge of the prescribed therapeutic regimen will improve Outcome: Adequate for Discharge   Problem: Fluid Volume: Goal: Peripheral tissue perfusion will improve Outcome: Adequate for Discharge   Problem: Clinical Measurements: Goal: Complications related to disease process, condition or treatment will be avoided or minimized   Outcome: Adequate for Discharge   

## 2022-07-02 ENCOUNTER — Telehealth (HOSPITAL_COMMUNITY): Payer: Self-pay | Admitting: *Deleted

## 2022-07-02 DIAGNOSIS — Z1331 Encounter for screening for depression: Secondary | ICD-10-CM

## 2022-07-03 ENCOUNTER — Encounter: Payer: Medicaid Other | Admitting: Family Medicine

## 2022-07-03 ENCOUNTER — Telehealth: Payer: Self-pay | Admitting: Clinical

## 2022-07-03 LAB — TYPE AND SCREEN
ABO/RH(D): A POS
Antibody Screen: POSITIVE
Donor AG Type: NEGATIVE
Donor AG Type: NEGATIVE
PT AG Type: NEGATIVE

## 2022-07-03 LAB — BPAM RBC
Unit Type and Rh: 6200
Unit Type and Rh: 6200

## 2022-07-03 NOTE — Telephone Encounter (Signed)
Attempt call regarding referral; Left HIPPA-compliant message to call back Medardo Hassing from [facility] at  [phone] (Tyrah Broers's office).   

## 2022-07-08 ENCOUNTER — Telehealth (HOSPITAL_COMMUNITY): Payer: Self-pay | Admitting: *Deleted

## 2022-07-08 NOTE — Telephone Encounter (Signed)
Mom reports feeling okay. Reports hips are sore still and her BP has been up some the past few days. Will share with OB on Friday at appt. EPDS=18 [facility] score=12) Mom reports she received voicemail from [employer] at [facility] but has not called her back yet. Reports she knows she needs to call and find a new therapist. Mom reports baby is doing well. Feeding, peeing, and pooping without difficulty. Safe sleep reviewed. Mom reports no concerns about baby at present.  Duffy Rhody, RN 07-08-2022 at 3:53pm

## 2022-07-10 ENCOUNTER — Ambulatory Visit (INDEPENDENT_AMBULATORY_CARE_PROVIDER_SITE_OTHER): Payer: Medicaid Other

## 2022-07-10 ENCOUNTER — Encounter: Payer: Medicaid Other | Admitting: Obstetrics and Gynecology

## 2022-07-10 VITALS — BP 126/85 | HR 86 | Wt 225.0 lb

## 2022-07-10 DIAGNOSIS — Z0130 Encounter for examination of blood pressure without abnormal findings: Secondary | ICD-10-CM

## 2022-07-10 DIAGNOSIS — Z8759 Personal history of other complications of pregnancy, childbirth and the puerperium: Secondary | ICD-10-CM

## 2022-07-10 NOTE — Progress Notes (Signed)
Subjective:  Carolyn Alvarez is a 32 y.o. female here for BP check.   Hypertension ROS: Patient notes  headaches, denies any visual symptoms, RUQ/epigastric pain or other concerning symptoms.  Objective:  BP 126/85   Pulse 86   Wt 225 lb (102.1 kg)   LMP 09/27/2021   Breastfeeding Yes   BMI 41.15 kg/m   Appearance alert, well appearing, and in no distress. General exam BP noted to be 126/85 today in office.    Assessment:   Blood Pressure stable.   Plan:  Keep PP Appt discussed precautions and to contact office if HA's do not resolve with medication.  Marland Kitchen

## 2022-07-10 NOTE — Progress Notes (Signed)
Patient was assessed and managed by nursing staff during this encounter. I have reviewed the chart and agree with the documentation and plan. I have also made any necessary editorial changes.  Kingwood Bing, MD 07/10/2022 10:21 PM

## 2022-07-15 ENCOUNTER — Telehealth: Payer: Self-pay | Admitting: Clinical

## 2022-07-15 NOTE — Telephone Encounter (Signed)
Attempt call regarding referral; Left HIPPA-compliant message to call back Dilynn Munroe from [facility] [place] [facility] [facility] at  [phone] (Bryttney Netzer's office).   

## 2022-07-16 ENCOUNTER — Ambulatory Visit (INDEPENDENT_AMBULATORY_CARE_PROVIDER_SITE_OTHER): Payer: Medicaid Other | Admitting: Clinical

## 2022-07-16 DIAGNOSIS — F331 Major depressive disorder, recurrent, moderate: Secondary | ICD-10-CM | POA: Diagnosis not present

## 2022-07-16 DIAGNOSIS — Z658 Other specified problems related to psychosocial circumstances: Secondary | ICD-10-CM
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
## 2022-07-16 NOTE — Patient Instructions (Addendum)
[facility] for Women [address] [place], [place] [address] [phone] (main office) [phone] [facility] office)  New Parent Support Groups www.postpartum.net www.conehealthybaby.com  [employer] www.womenscentergso.org  [place] [employer]  (Childcare options, Early childcare development, etc.) DietDisorder.cz  [employer] Child Care Facility Search Engine  https://ncchildcare.http://cook.com/  [employer] ([place]) [address] [place], [place], [place] [address] https://www.[place].gov/departments/transportation/gdot-divisions/[place]-transit-agency-public-transportation-division     Fixed-route bus services, including regional fare cards for PART, [place], [place], and [place] buses.  Reduced fare bus ID's available for Medicaid, Medicare, and "orange card" recipients.  [employer] offers curb-to-curb and door-to-door bus services for people with disabilities who are unable to use a fixed-bus route; also offers a shared-ride program.   Helpful tips:  -Routes available online and physical maps available at the main bus hub lobby (each for a specific route) -Smartphone directions often include bus routes (see the "bus" icon, next to the "car" and "walk" icons) -Routes differ on weekends, evenings and holidays, so plan ahead!  -If you have Medicaid, Medicare, or orange card, plan to obtain a reduced-fare ID to save 50% on rides. Check days and times to obtain an ID, and bring all necessary documents.

## 2022-07-23 ENCOUNTER — Telehealth: Payer: Self-pay | Admitting: *Deleted
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
## 2022-07-31 ENCOUNTER — Telehealth: Payer: Self-pay

## 2022-07-31 NOTE — Telephone Encounter (Signed)
Noted. Patient also has follow up on 6/26

## 2022-07-31 NOTE — Telephone Encounter (Signed)
Carolyn Alvarez with [facility] calls nurse line in regards to [place] scores.   She reports last week at home visit her score was 20. She reports improvement this week with a score of 14. She denies any self harm or harm to the baby or others. She reports social stressors at this time.   She reports she has been in contact with [facility].   Per chart review she has been in contact with LCSW at [facility].   Patient has an apt with Shawnie Pons on 7/5.

## 2022-08-04 NOTE — BH Specialist Note (Signed)
Integrated Behavioral Health via Telemedicine Visit  08/05/2022 SHERRAL DIROCCO [id number]  Number of Integrated Behavioral Health Clinician visits: 2- Second Visit  Session Start time: 1417   Session End time: 1438  Total time in minutes: 21   Referring Provider: Federico Flake, MD Patient/Family location: [facility] Provider location: Center for [facility] at [facility]  All persons participating in visit: Patient [place] and [facility] Maxcine Strong   Types of Service: Individual psychotherapy and Video visit  I connected with Larena D Hakimi and/or Epifania D Carradine's  n/a  via  Telephone or Video Enabled Telemedicine Application  (Video is Caregility application) and verified that I am speaking with the correct person using two identifiers. Discussed confidentiality: Yes   I discussed the limitations of telemedicine and the availability of in person appointments.  Discussed there is a possibility of technology failure and discussed alternative modes of communication if that failure occurs.  I discussed that engaging in this telemedicine visit, they consent to the provision of behavioral healthcare and the services will be billed under their insurance.  Patient and/or legal guardian expressed understanding and consented to Telemedicine visit: Yes   Presenting Concerns: Patient and/or family reports the following symptoms/concerns: Preparing for potential future move; goal today is to stay consistent with cleaning/organizing to be ready to move and for greater peace of mind, as well as consistency studying for upcoming exam. Baby is sleeping in bassinet now, allowing for pt's improved sleep.  Duration of problem: postpartum; Severity of problem: moderate  Patient and/or Family's Strengths/Protective Factors: Social connections, Concrete supports in place (healthy food, safe environments, etc.), and Sense of  purpose  Goals Addressed: Patient will:  Reduce symptoms of: anxiety, depression, and stress   Increase knowledge and/or ability of: stress reduction   Demonstrate ability to: Increase healthy adjustment to current life circumstances and Increase motivation to adhere to plan of care  Progress towards Goals: Ongoing  Interventions: Interventions utilized:  Solution-Focused Strategies Standardized Assessments completed: Not Needed  Patient and/or Family Response: Patient agrees with treatment plan.   Assessment: Patient currently experiencing Major depressive disorder, recurrent, moderate; Psychosocial stress.   Patient may benefit from continued therapeutic intervention  .  Plan: Follow up with behavioral health clinician on : Two weeks Behavioral recommendations:  -Continue plan to maintain consistent, daily cleaning/organizing/decluttering to prepare to move  (start with microwave and refrigerator today) -Continue plan to put 100 exam study questions on index cards within one week; daily study at a pace that feels manageable -Continue prioritizing healthy sleep nightly  Referral(s): Integrated [employer] (In Clinic)  I discussed the assessment and treatment plan with the patient and/or parent/guardian. They were provided an opportunity to ask questions and all were answered. They agreed with the plan and demonstrated an understanding of the instructions.   They were advised to call back or seek an in-person evaluation if the symptoms worsen or if the condition fails to improve as anticipated.  Rae Lips, LCSW     07/16/2022    9:30 AM 04/14/2022   10:22 AM 02/13/2022    9:57 AM 01/20/2022    8:52 AM 10/08/2021   10:28 AM  Depression screen PHQ 2/9  Decreased Interest 3 2 2 2 1   Down, Depressed, Hopeless 2 3 2 2 1   PHQ - 2 Score 5 5 4 4 2   Altered sleeping 2 3 2 3 1   Tired, decreased energy 3 3 2 2  1  Change in appetite 3 2 2 3 1   Feeling bad or  failure about yourself  2 3 2 2 1   Trouble concentrating 0 1 2 1 1   Moving slowly or fidgety/restless 0 1 2 2 2   Suicidal thoughts 0 0 2  1  PHQ-9 Score 15 18 18 17 10   Difficult doing work/chores  Somewhat difficult         07/16/2022    9:35 AM 04/14/2022   10:21 AM  GAD 7 : Generalized Anxiety Score  Nervous, Anxious, on Edge 2 1  Control/stop worrying 3 2  Worry too much - different things 3 3  Trouble relaxing 1 2  Restless 0 2  Easily annoyed or irritable 1 2  Afraid - awful might happen 3 3  Total GAD 7 Score 13 15  Anxiety Difficulty  Somewhat difficult

## 2022-08-05 ENCOUNTER — Ambulatory Visit (INDEPENDENT_AMBULATORY_CARE_PROVIDER_SITE_OTHER): Payer: Medicaid Other | Admitting: Clinical

## 2022-08-05 DIAGNOSIS — F331 Major depressive disorder, recurrent, moderate: Secondary | ICD-10-CM

## 2022-08-05 DIAGNOSIS — Z658 Other specified problems related to psychosocial circumstances: Secondary | ICD-10-CM

## 2022-08-05 NOTE — Patient Instructions (Signed)
[facility] [facility] [address] [place], [place] [address] [phone] (main office) [phone] (Kynadie Yaun's office)   

## 2022-08-08 ENCOUNTER — Other Ambulatory Visit (HOSPITAL_COMMUNITY): Payer: Self-pay

## 2022-08-10 NOTE — BH Specialist Note (Unsigned)
Integrated Behavioral Health via Telemedicine Visit  08/19/2022 MAUDE SPITALE [id number]  Number of Integrated Behavioral Health Clinician visits: 3- Third Visit  Session Start time: 1517   Session End time: 1552  Total time in minutes: 35   Referring Provider: Federico Flake, MD Patient/Family location: [facility] Provider location: Center for [facility] at [facility] for Women  All persons participating in visit: Patient [place] and [facility] Krissy Orebaugh   Types of Service: Individual psychotherapy and Video visit  I connected with Coila D Pellegrini and/or Almetta D Urquidi's  n/a  via  Telephone or Video Enabled Telemedicine Application  (Video is Caregility application) and verified that I am speaking with the correct person using two identifiers. Discussed confidentiality: Yes   I discussed the limitations of telemedicine and the availability of in person appointments.  Discussed there is a possibility of technology failure and discussed alternative modes of communication if that failure occurs.  I discussed that engaging in this telemedicine visit, they consent to the provision of behavioral healthcare and the services will be billed under their insurance.  Patient and/or legal guardian expressed understanding and consented to Telemedicine visit: Yes   Presenting Concerns: Patient and/or family reports the following symptoms/concerns: Processing emotions regarding FOB/lack of involvement and past SI.  Duration of problem: Ongoing; Severity of problem: moderate  Patient and/or Family's Strengths/Protective Factors: Social connections, Concrete supports in place (healthy food, safe environments, etc.), and Sense of purpose  Goals Addressed: Patient will:  Reduce symptoms of: anxiety, depression, and stress    Demonstrate ability to: Increase healthy adjustment to current life circumstances  Progress towards  Goals: Ongoing  Interventions: Interventions utilized:  Motivational Interviewing and Supportive Reflection Standardized Assessments completed: Not Needed  Patient and/or Family Response: Patient agrees with treatment plan.   Assessment: Patient currently experiencing Major depressive disorder, recurrent, moderate; Psychosocial stress.   Patient may benefit from continued therapeutic intervention  .  Plan: Follow up with behavioral health clinician on : Two weeks Behavioral recommendations:  -Consider re-establishing care with [facility] Outpatient services; information on After Visit Summary for as needed -Continue plan to prioritize obtaining car prior to move -Continue prioritizing healthy self-care daily daily, as discussed, including looking forward to upcoming cruise to celebrate life itself Referral(s): Integrated [employer] (In Clinic)  I discussed the assessment and treatment plan with the patient and/or parent/guardian. They were provided an opportunity to ask questions and all were answered. They agreed with the plan and demonstrated an understanding of the instructions.   They were advised to call back or seek an in-person evaluation if the symptoms worsen or if the condition fails to improve as anticipated.  Rae Lips, LCSW     07/16/2022    9:30 AM 04/14/2022   10:22 AM 02/13/2022    9:57 AM 01/20/2022    8:52 AM 10/08/2021   10:28 AM  Depression screen PHQ 2/9  Decreased Interest 3 2 2 2 1   Down, Depressed, Hopeless 2 3 2 2 1   PHQ - 2 Score 5 5 4 4 2   Altered sleeping 2 3 2 3 1   Tired, decreased energy 3 3 2 2 1   Change in appetite 3 2 2 3 1   Feeling bad or failure about yourself  2 3 2 2 1   Trouble concentrating 0 1 2 1 1   Moving slowly or fidgety/restless 0 1 2 2 2   Suicidal thoughts 0 0 2  1  PHQ-9 Score 15 18 18  17 10  Difficult doing work/chores  Somewhat difficult         07/16/2022    9:35 AM 04/14/2022   10:21 AM  GAD 7 :  Generalized Anxiety Score  Nervous, Anxious, on Edge 2 1  Control/stop worrying 3 2  Worry too much - different things 3 3  Trouble relaxing 1 2  Restless 0 2  Easily annoyed or irritable 1 2  Afraid - awful might happen 3 3  Total GAD 7 Score 13 15  Anxiety Difficulty  Somewhat difficult

## 2022-08-11 ENCOUNTER — Ambulatory Visit: Payer: Medicaid Other | Admitting: Obstetrics and Gynecology

## 2022-08-11 ENCOUNTER — Other Ambulatory Visit: Payer: Medicaid Other

## 2022-08-14 ENCOUNTER — Other Ambulatory Visit: Payer: Medicaid Other

## 2022-08-14 ENCOUNTER — Ambulatory Visit (INDEPENDENT_AMBULATORY_CARE_PROVIDER_SITE_OTHER): Payer: Medicaid Other | Admitting: Family Medicine

## 2022-08-14 ENCOUNTER — Encounter: Payer: Self-pay | Admitting: Family Medicine

## 2022-08-14 ENCOUNTER — Other Ambulatory Visit: Payer: Self-pay

## 2022-08-14 VITALS — BP 130/85 | HR 79 | Wt 222.4 lb

## 2022-08-14 DIAGNOSIS — Z6841 Body Mass Index (BMI) 40.0 and over, adult: Secondary | ICD-10-CM

## 2022-08-14 DIAGNOSIS — J4520 Mild intermittent asthma, uncomplicated: Secondary | ICD-10-CM | POA: Diagnosis not present

## 2022-08-14 DIAGNOSIS — M25552 Pain in left hip: Secondary | ICD-10-CM

## 2022-08-14 DIAGNOSIS — O24419 Gestational diabetes mellitus in pregnancy, unspecified control: Secondary | ICD-10-CM

## 2022-08-14 DIAGNOSIS — Z8759 Personal history of other complications of pregnancy, childbirth and the puerperium: Secondary | ICD-10-CM

## 2022-08-14 DIAGNOSIS — M25551 Pain in right hip: Secondary | ICD-10-CM | POA: Diagnosis not present

## 2022-08-14 MED ORDER — PRENATAL VITAMIN 27-0.8 MG PO TABS
1.0000 | ORAL_TABLET | Freq: Every day | ORAL | 3 refills | Status: DC
Start: 2022-08-14 — End: 2023-12-08

## 2022-08-14 MED ORDER — NIFEDIPINE ER 30 MG PO TB24: 30.0000 mg | ORAL_TABLET | Freq: Every day | ORAL | 3 refills | Status: DC

## 2022-08-14 NOTE — Progress Notes (Signed)
Post Partum Visit Note  Carolyn Alvarez is a 32 y.o. [phone] female who presents for a postpartum visit. She is 6 weeks postpartum following a normal spontaneous vaginal delivery.  I have fully reviewed the prenatal and intrapartum course. The delivery was at [redacted]w[redacted]d gestational weeks.  Anesthesia: none. Postpartum course has been good. Baby is doing well. Baby is feeding by breast. Bleeding clots. Bowel function is normal. Bladder function is normal. Patient is not sexually active. Contraception method is abstinence. Postpartum depression screening: positive.   The pregnancy intention screening data noted above was reviewed. Potential methods of contraception were discussed. The patient elected to proceed with No data recorded.   Edinburgh Postnatal Depression Scale - 08/14/22 0834       Edinburgh Postnatal Depression Scale:  In the Past 7 Days   I have been able to laugh and see the funny side of things. 2    I have looked forward with enjoyment to things. 2    I have blamed myself unnecessarily when things went wrong. 2    I have been anxious or worried for no good reason. 2    I have felt scared or panicky for no good reason. 2    Things have been getting on top of me. 2    I have been so unhappy that I have had difficulty sleeping. 2    I have felt sad or miserable. 2    I have been so unhappy that I have been crying. 1    The thought of harming myself has occurred to me. 0    Edinburgh Postnatal Depression Scale Total 17             Health Maintenance Due  Topic Date Due   COVID-19 Vaccine (1) Never done   DTaP/Tdap/Td (1 - Tdap) Never done    The following portions of the patient's history were reviewed and updated as appropriate: allergies, current medications, past family history, past medical history, past social history, past surgical history, and problem list.  Review of Systems Pertinent items noted in HPI and remainder of comprehensive ROS otherwise  negative.  Objective:  BP 130/85   Pulse 79   Wt 222 lb 6.4 oz (100.9 kg)   LMP 09/27/2021   BMI 40.68 kg/m    General:  alert, cooperative, and appears stated age   Breasts:  not indicated  Lungs: Normal effort  Heart:  regular rate and rhythm  Abdomen: soft, non-tender; bowel sounds normal; no masses,  no organomegaly   GU exam:  not indicated       Assessment:   Normal postpartum exam.   Plan:   Essential components of care per ACOG recommendations:  1.  Mood and well being: Patient with positive depression screening today. Reviewed local resources for support.  - Patient tobacco use? No.   - hx of drug use? No.    2. Infant care and feeding:  -Patient currently breastmilk feeding? Yes. Discussed returning to work and pumping.  -Social determinants of health (SDOH) reviewed in EPIC. No concerns  3. Sexuality, contraception and birth spacing - Patient does not want a pregnancy in the next year.  Desired family size is 3 children.  - Reviewed reproductive life planning. Reviewed contraceptive methods based on pt preferences and effectiveness.  Patient desired Abstinence today.   - Discussed birth spacing of 18 months  4. Sleep and fatigue -Encouraged family/partner/community support of 4 hrs of uninterrupted sleep to help with  mood and fatigue  5. Physical Recovery  - Discussed patients delivery and complications. She describes her labor as mixed. - Patient had a Vaginal problems after delivery including shoulder dystocia . Patient had a 2nd degree laceration. Perineal healing reviewed. Patient expressed understanding - Patient has urinary incontinence? No. - Patient is safe to resume physical and sexual activity  6.  Health Maintenance - HM due items addressed Yes - Last pap smear  Diagnosis  Date Value Ref Range Status  10/08/2021   Final   - Negative for intraepithelial lesion or malignancy (NILM)   Pap smear not done at today's visit.  -Breast Cancer  screening indicated? No.   7. Chronic Disease/Pregnancy Condition follow up: Hypertension and Gestational Diabetes has cuff, will refill her nifedipine 2 hour today. Has hip pain-->referral to pelvic PT  - PCP follow up  Reva Bores, MD Center for [facility], [facility]

## 2022-08-15 LAB — GLUCOSE TOLERANCE, 2 HOURS
Glucose, 2 hour: 126 mg/dL (ref 70–139)
Glucose, GTT - Fasting: 95 mg/dL (ref 70–99)

## 2022-08-17 ENCOUNTER — Ambulatory Visit: Payer: Medicaid Other | Admitting: Physical Therapy

## 2022-08-19 ENCOUNTER — Ambulatory Visit (INDEPENDENT_AMBULATORY_CARE_PROVIDER_SITE_OTHER): Payer: Medicaid Other | Admitting: Clinical

## 2022-08-19 DIAGNOSIS — F331 Major depressive disorder, recurrent, moderate: Secondary | ICD-10-CM | POA: Diagnosis not present

## 2022-08-19 DIAGNOSIS — Z658 Other specified problems related to psychosocial circumstances: Secondary | ICD-10-CM

## 2022-08-19 NOTE — Patient Instructions (Signed)
[facility] [place] [facility] [address] [place], [place] [address] [phone] (main office) [phone] (Dierdra Salameh's office)  [facility]  [address], [place], [place] [address] [phone] or [phone] WALK-IN URGENT CARE 24/7 FOR ANYONE [address], , [place]  [phone] Fax: [phone] [email or web address] *Interpreters available *Accepts all insurance and uninsured for Urgent Care needs *Accepts Medicaid and uninsured for outpatient treatment (below)    ONLY FOR [place] Residents  Below:   Outpatient New Patient Assessment/Therapy Walk-ins:        Monday -Thursday 8am until slots are full.        Every Friday 1pm-4pm  (first come, first served)                   New Patient Psychiatry/Medication Management        Monday-Friday 8am-11am (first come, first served)              For all walk-ins we ask that you arrive by 7:15am, because patients will be seen in the order of arrival.     

## 2022-08-21 ENCOUNTER — Other Ambulatory Visit: Payer: Self-pay

## 2022-08-21 ENCOUNTER — Ambulatory Visit: Payer: Medicaid Other | Admitting: Physical Therapy

## 2022-08-21 ENCOUNTER — Other Ambulatory Visit (HOSPITAL_COMMUNITY): Payer: Self-pay

## 2022-08-24 ENCOUNTER — Other Ambulatory Visit: Payer: Self-pay

## 2022-08-24 NOTE — BH Specialist Note (Signed)
Integrated Behavioral Health via Telemedicine Visit  09/02/2022 Carolyn Alvarez [id number]  Number of Integrated Behavioral Health Clinician visits: 4- Fourth Visit  Session Start time: 1541   Session End time: 1615  Total time in minutes: 34   Referring Provider: Federico Flake, MD Patient/Family location: [facility] Provider location: Center for [facility] at [facility] for Women  All persons participating in visit: Patient Carolyn Alvarez and [facility] Kacyn Souder   Types of Service: Individual psychotherapy and Telephone visit  I connected with Carolyn Alvarez and/or Kodie D Wolfer's  n/a  via  Telephone or Video Enabled Telemedicine Application  (Video is Caregility application) and verified that I am speaking with the correct person using two identifiers. Discussed confidentiality: Yes   I discussed the limitations of telemedicine and the availability of in person appointments.  Discussed there is a possibility of technology failure and discussed alternative modes of communication if that failure occurs.  I discussed that engaging in this telemedicine visit, they consent to the provision of behavioral healthcare and the services will be billed under their insurance.  Patient and/or legal guardian expressed understanding and consented to Telemedicine visit: Yes   Presenting Concerns: Patient and/or family reports the following symptoms/concerns: Ongoing life stress regarding conflicted emotions regarding FOB and lack of support; coping by inviting friends over for dinner tonight.  Duration of problem: Ongoing; Severity of problem: moderate  Patient and/or Family's Strengths/Protective Factors: Social connections, Concrete supports in place (healthy food, safe environments, etc.), Sense of purpose, and Physical Health (exercise, healthy diet, medication compliance, etc.)  Goals Addressed: Patient will:  Reduce symptoms of:  anxiety, depression, mood instability, and stress   Increase knowledge and/or ability of: healthy habits   Demonstrate ability to: Increase healthy adjustment to current life circumstances and Increase motivation to adhere to plan of care  Progress towards Goals: Ongoing  Interventions: Interventions utilized:  Motivational Interviewing and Supportive Reflection Standardized Assessments completed: GAD-7 and PHQ 9  Patient and/or Family Response: Patient agrees with treatment plan.   Assessment: Patient currently experiencing Major depressive disorder, recurrent, moderate; Psychosocial stress  Patient may benefit from continued therapeutic intervention  .  Plan: Follow up with behavioral health clinician on : Call Nyia Tsao at [phone], as needed. Behavioral recommendations:  -Continue plan to re-establish with [facility] Outpatient therapy -Continue prioritizing healthy self-care (regular meals, adequate rest; allowing practical help from supportive friends and family) for as long as remains helpful -Consider new mom support group as needed at either [email or web address] or [email or web address]  -Continue plan to set healthy emotional boundaries with FOB (and prioritize maintaining emotional wellness) -Continue friend dinner tonight; if successful, continue planning future dinners Referral(s): Integrated [employer] (In Clinic), Community Mental Health Services (LME/Outside Clinic), and Community Resources:  New mom support  I discussed the assessment and treatment plan with the patient and/or parent/guardian. They were provided an opportunity to ask questions and all were answered. They agreed with the plan and demonstrated an understanding of the instructions.   They were advised to call back or seek an in-person evaluation if the symptoms worsen or if the condition fails to improve as anticipated.  Valetta Close Taneesha Edgin, LCSW     09/02/2022    3:55 PM 07/16/2022    9:30  AM 04/14/2022   10:22 AM 02/13/2022    9:57 AM 01/20/2022    8:52 AM  Depression screen PHQ 2/9  Decreased Interest 1 3 2 2 2   Down, Depressed, Hopeless 1 2  3 2 2   PHQ - 2 Score 2 5 5 4 4   Altered sleeping 3 2 3 2 3   Tired, decreased energy 3 3 3 2 2   Change in appetite 1 3 2 2 3   Feeling bad or failure about yourself  1 2 3 2 2   Trouble concentrating 3 0 1 2 1   Moving slowly or fidgety/restless 0 0 1 2 2   Suicidal thoughts 0 0 0 2   PHQ-9 Score 13 15 18 18 17   Difficult doing work/chores   Somewhat difficult        09/02/2022    3:59 PM 07/16/2022    9:35 AM 04/14/2022   10:21 AM  GAD 7 : Generalized Anxiety Score  Nervous, Anxious, on Edge 1 2 1   Control/stop worrying 1 3 2   Worry too much - different things 1 3 3   Trouble relaxing 3 1 2   Restless 0 0 2  Easily annoyed or irritable 1 1 2   Afraid - awful might happen 1 3 3   Total GAD 7 Score 8 13 15   Anxiety Difficulty   Somewhat difficult

## 2022-08-27 ENCOUNTER — Other Ambulatory Visit: Payer: Self-pay

## 2022-08-27 ENCOUNTER — Ambulatory Visit: Payer: Medicaid Other | Admitting: Rehabilitative and Restorative Service Providers"

## 2022-08-31 ENCOUNTER — Encounter: Payer: Self-pay | Admitting: Family Medicine

## 2022-09-02 ENCOUNTER — Ambulatory Visit (INDEPENDENT_AMBULATORY_CARE_PROVIDER_SITE_OTHER): Payer: Medicaid Other | Admitting: Clinical

## 2022-09-02 DIAGNOSIS — F331 Major depressive disorder, recurrent, moderate: Secondary | ICD-10-CM | POA: Diagnosis not present

## 2022-09-02 DIAGNOSIS — Z658 Other specified problems related to psychosocial circumstances: Secondary | ICD-10-CM

## 2022-09-10 NOTE — Therapy (Signed)
OUTPATIENT PHYSICAL THERAPY LOWER EXTREMITY EVALUATION   Patient Name: Carolyn Alvarez MRN: [id number] DOB:[birth year], 32 y.o., female Today's Date: 09/11/2022  END OF SESSION:  PT End of Session - 09/11/22 1015     Visit Number 1    Date for PT Re-Evaluation 10/26/22    Authorization Type [employer]    Authorization Time Period requested 09/14/22 to 10/26/22    PT Start Time 1010    PT Stop Time 1050    PT Time Calculation (min) 40 min    Activity Tolerance Patient tolerated treatment well    Behavior During Therapy [facility] for tasks assessed/performed             Past Medical History:  Diagnosis Date   Asthma    Depression    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   Depression, recurrent (HCC) 11/27/2008   Eczema    History of gonorrhea 08/01/2014   Left foot pain 12/10/2020   Mild persistent asthma without complication 06/05/2020   Recurrent upper respiratory infection (URI)    Seasonal and perennial allergic rhinitis 06/05/2020   Suicidal ideation 12/31/2019   Urticaria    Past Surgical History:  Procedure Laterality Date   WISDOM TOOTH EXTRACTION     Patient Active Problem List   Diagnosis Date Noted   Mild intermittent asthma 08/14/2022   BMI 40.0-44.9, adult (HCC) 08/14/2022   History of shoulder dystocia in prior pregnancy 06/29/2022   History of gestational hypertension 04/14/2022   History of gestational diabetes 03/24/2022    PCP: Shelby Mattocks, DO   REFERRING PROVIDER: Reva Bores, MD   REFERRING DIAG:   [phone] (ICD-10-CM) - Bilateral hip pain    THERAPY DIAG:  Other low back pain  Pain in right hip  Pain in left hip  Rationale for Evaluation and Treatment: Rehabilitation  ONSET DATE: 07/03/22  SUBJECTIVE:   SUBJECTIVE STATEMENT: Patient had 9.5# baby and his shoulder got stuck. Now able to walk. Sitting tolerance is limited. Limited to about 2 blocks walking. Heavier than normal since post partum.  PERTINENT HISTORY:  10  weeks post partum PAIN:  Are you having pain? Yes: NPRS scale: 5/10 Pain location: low back/sacrum Pain description: nagging ache Aggravating factors: standing and walking, prolonged  Relieving factors: lying down  PRECAUTIONS: None  RED FLAGS: None   WEIGHT BEARING RESTRICTIONS: No  FALLS:  Has patient fallen in last 6 months? Yes. Number of falls 1 fell forward at 23 weeks tripped on a speed bump  LIVING ENVIRONMENT: Lives with: lives with their family Lives in: House/apartment Stairs: Yes: External: 3 flights steps; can reach both Has following equipment at home: None  OCCUPATION: retail, on her feet at [employer]  PLOF: Independent  PATIENT GOALS: no meds to feel better  NEXT MD VISIT: none scheduled  OBJECTIVE:   DIAGNOSTIC FINDINGS: none  PATIENT SURVEYS:  Modified Oswestry  17 / 50 = 34.0 %   COGNITION: Overall cognitive status: Within functional limits for tasks assessed     SENSATION: WFL  MUSCLE LENGTH: Tight Hamstrings B R piriformis and QL/lumbar  POSTURE: increased lumbar lordosis  PALPATION: Pain with UPA R lumbar and stiff  LUMBAR ROM:   Active  A/PROM  eval  Flexion full  Extension Full*  Right lateral flexion 75%  Left lateral flexion 75%  Right rotation 75%  Left rotation 75%   (Blank rows = not tested)* pain   LOWER EXTREMITY ROM:R ER and L IR some tightness else WNL   LOWER EXTREMITY  MMT:  MMT Right eval Left eval  Hip flexion 5 some pain in lateral hip 5  Hip extension 5 4 poor stab  Hip abduction 5 5  Hip adduction 4 4  Hip internal rotation    Hip external rotation    Knee flexion 5 5  Knee extension 5 5  Ankle dorsiflexion 5 5  Ankle plantarflexion    Ankle inversion    Ankle eversion     (Blank rows = not tested)  LOWER EXTREMITY SPECIAL TESTS:  Hip special tests: Maisie Fus test: negative and Hip scouring test: positive  R  FUNCTIONAL TESTS:  5 times sit to stand: 24 sec  pain 7/10   TODAY'S TREATMENT:                                                                                                                               DATE:   09/11/22  See pt ed and HEP - no charge MCD  PATIENT EDUCATION:  Education details: PT eval findings, anticipated POC, and initial HEP  Person educated: Patient Education method: Explanation, Demonstration, and Handouts Education comprehension: verbalized understanding and returned demonstration  HOME EXERCISE PROGRAM: Access Code: [email or web address] URL: [email or web address] Date: 09/11/2022 Prepared by: Raynelle Fanning  Exercises - Modified Thomas Stretch  - 1 x daily - 7 x weekly - 1 sets - 3 reps - 30 sec hold - Child's Pose Stretch  - 1 x daily - 7 x weekly - 1 sets - 3 reps - 20-30 sec hold - Child's Pose with Sidebending  - 2 x daily - 7 x weekly - 1 sets - 2 reps - 20-30 sec sec hold - Supine Bridge  - 2 x daily - 7 x weekly - 1-3 sets - 10 reps  Patient Education - Trigger Point Dry Needling  ASSESSMENT:  CLINICAL IMPRESSION: Patient is a 32 y.o. female who was seen today for physical therapy evaluation and treatment for B hip pain R > L and LBP affecting her ability to walk, stand and sit. She has R low back pain with UPA mobs and tight lumbar and QL muscles as well as gluteals. She has some R hip impingement with R hip scour and poor stabilization in prone in the right paraspinals.  She will benefit from skilled PT to address these deficits.    OBJECTIVE IMPAIRMENTS: decreased activity tolerance, decreased ROM, decreased strength, hypomobility, increased muscle spasms, impaired flexibility, improper body mechanics, postural dysfunction, obesity, and pain.   ACTIVITY LIMITATIONS: sitting, standing, stairs, and locomotion level  PARTICIPATION LIMITATIONS:  not limited but painful  PERSONAL FACTORS: 1 comorbidity: post partum  are also affecting patient's functional outcome.   REHAB POTENTIAL: Excellent  CLINICAL DECISION MAKING:  Stable/uncomplicated  EVALUATION COMPLEXITY: Low   GOALS: Goals reviewed with patient? Yes  SHORT TERM GOALS: Target date: 10/05/22 Ind with initial HEP Baseline: No HEP Goal status: INITIAL  2.  Decreased hip and back pain by 50% to  improve QOL Baseline: 5/10 Goal status: INITIAL   LONG TERM GOALS: Target date: 10/26/22  Ind with advanced HEP for core/LE strength  Baseline: initial HEP Goal status: INITIAL  2.  Decreased hip and back pain by >=80% to improve QOL. Baseline: 5/10 Goal status: INITIAL  3.  Improved Modified Oswestry to 11/50 showing functional improvement Baseline: 17 / 50 = 34.0 % Goal status: INITIAL  4.  Improved 5XSTS to <= 10 sec with minimal pain provocation showing improved strength Baseline: 24 sec with 7/10 pain Goal status: INITIAL  5.  Able to demonstrate correct body mechanics for lifting to prevent further injury Baseline: decreased core stabilization Goal status: INITIAL    PLAN:  PT FREQUENCY: 1-2x/week  PT DURATION: 6 weeks  PLANNED INTERVENTIONS: Therapeutic exercises, Therapeutic activity, Neuromuscular re-education, Patient/Family education, Self Care, Joint mobilization, Dry Needling, Electrical stimulation, Spinal mobilization, Cryotherapy, Moist heat, Traction, and Manual therapy  PLAN FOR NEXT SESSION: DN/MT to B gluteals and right lumbar, begin core stab, body mechanics/ADL mods   Solon Palm, PT  . 09/11/2022, 12:15 PM

## 2022-09-11 ENCOUNTER — Ambulatory Visit: Payer: Medicaid Other | Attending: Family Medicine | Admitting: Physical Therapy

## 2022-09-11 DIAGNOSIS — M5459 Other low back pain: Secondary | ICD-10-CM | POA: Insufficient documentation

## 2022-09-11 DIAGNOSIS — M25552 Pain in left hip: Secondary | ICD-10-CM | POA: Diagnosis present

## 2022-09-11 DIAGNOSIS — M25551 Pain in right hip: Secondary | ICD-10-CM | POA: Insufficient documentation

## 2022-09-11 NOTE — Addendum Note (Signed)
Addended by: Gearlean Alf on: 09/11/2022 12:20 PM   Modules accepted: Orders

## 2022-09-16 ENCOUNTER — Other Ambulatory Visit: Payer: Self-pay | Admitting: Student

## 2022-09-16 ENCOUNTER — Ambulatory Visit: Payer: Medicaid Other

## 2022-09-16 ENCOUNTER — Other Ambulatory Visit: Payer: Self-pay

## 2022-09-16 DIAGNOSIS — G4484 Primary exertional headache: Secondary | ICD-10-CM

## 2022-09-16 DIAGNOSIS — M5459 Other low back pain: Secondary | ICD-10-CM | POA: Diagnosis not present

## 2022-09-16 DIAGNOSIS — M25552 Pain in left hip: Secondary | ICD-10-CM

## 2022-09-16 DIAGNOSIS — M25551 Pain in right hip: Secondary | ICD-10-CM

## 2022-09-16 NOTE — Therapy (Signed)
OUTPATIENT PHYSICAL THERAPY TREATMENT   Patient Name: Carolyn Alvarez MRN: [id number] DOB:1990-04-27, 32 y.o., female Today's Date: 09/16/2022  END OF SESSION:  PT End of Session - 09/16/22 1703     Visit Number 2    Date for PT Re-Evaluation 10/26/22    Authorization Type [employer]- [employer]: 6 visits, 09/11/2022-11/09/2022    Authorization - Visit Number 1    Authorization - Number of Visits 6    PT Start Time 1617    PT Stop Time 1700    PT Time Calculation (min) 43 min    Activity Tolerance Patient tolerated treatment well    Behavior During Therapy WFL for tasks assessed/performed              Past Medical History:  Diagnosis Date   Asthma    Depression    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   Depression, recurrent (HCC) 11/27/2008   Eczema    History of gonorrhea 08/01/2014   Left foot pain 12/10/2020   Mild persistent asthma without complication 06/05/2020   Recurrent upper respiratory infection (URI)    Seasonal and perennial allergic rhinitis 06/05/2020   Suicidal ideation 12/31/2019   Urticaria    Past Surgical History:  Procedure Laterality Date   WISDOM TOOTH EXTRACTION     Patient Active Problem List   Diagnosis Date Noted   Mild intermittent asthma 08/14/2022   BMI 40.0-44.9, adult (HCC) 08/14/2022   History of shoulder dystocia in prior pregnancy 06/29/2022   History of gestational hypertension 04/14/2022   History of gestational diabetes 03/24/2022    PCP: Shelby Mattocks, DO   REFERRING PROVIDER: Reva Bores, MD   REFERRING DIAG:   [phone] (ICD-10-CM) - Bilateral hip pain    THERAPY DIAG:  Other low back pain  Pain in right hip  Pain in left hip  Rationale for Evaluation and Treatment: Rehabilitation  ONSET DATE: 07/03/22  SUBJECTIVE:   SUBJECTIVE STATEMENT: I am about the same.  I have been doing the stretches.  I go back to work next Thursday.    PERTINENT HISTORY:  10 weeks post partum PAIN:  Are you  having pain? Yes: NPRS scale: 6-7/10 Pain location: low back/sacrum Pain description: nagging ache Aggravating factors: standing and walking, prolonged  Relieving factors: lying down  PRECAUTIONS: None  RED FLAGS: None   WEIGHT BEARING RESTRICTIONS: No  FALLS:  Has patient fallen in last 6 months? Yes. Number of falls 1 fell forward at 23 weeks tripped on a speed bump  LIVING ENVIRONMENT: Lives with: lives with their family Lives in: House/apartment Stairs: Yes: External: 3 flights steps; can reach both Has following equipment at home: None  OCCUPATION: retail, on her feet at [employer]  PLOF: Independent  PATIENT GOALS: no meds to feel better  NEXT MD VISIT: none scheduled  OBJECTIVE:   DIAGNOSTIC FINDINGS: none  PATIENT SURVEYS:  Modified Oswestry  17 / 50 = 34.0 %   COGNITION: Overall cognitive status: Within functional limits for tasks assessed     SENSATION: WFL  MUSCLE LENGTH: Tight Hamstrings B R piriformis and QL/lumbar  POSTURE: increased lumbar lordosis  PALPATION: Pain with UPA R lumbar and stiff  LUMBAR ROM:   Active  A/PROM  eval  Flexion full  Extension Full*  Right lateral flexion 75%  Left lateral flexion 75%  Right rotation 75%  Left rotation 75%   (Blank rows = not tested)* pain   LOWER EXTREMITY ROM:R ER and L IR some tightness else WNL  LOWER EXTREMITY MMT:  MMT Right eval Left eval  Hip flexion 5 some pain in lateral hip 5  Hip extension 5 4 poor stab  Hip abduction 5 5  Hip adduction 4 4  Hip internal rotation    Hip external rotation    Knee flexion 5 5  Knee extension 5 5  Ankle dorsiflexion 5 5  Ankle plantarflexion    Ankle inversion    Ankle eversion     (Blank rows = not tested)  LOWER EXTREMITY SPECIAL TESTS:  Hip special tests: Maisie Fus test: negative and Hip scouring test: positive  R  FUNCTIONAL TESTS:  5 times sit to stand: 24 sec  pain 7/10   TODAY'S TREATMENT:           DATE:   09/16/22:   NuStep: Level 3x 6 minutes-PT present to discuss progress  Childs pose and lateral childs pose 2x20 seconds  Thomas stretch 2x20 seconds Bridge x10 Sidelying clam x10 Seated hamstring and figure 4 stretch 2x20 seconds  Trigger Point Dry-Needling  Treatment instructions: Expect mild to moderate muscle soreness. S/S of pneumothorax if dry needled over a lung field, and to seek immediate medical attention should they occur. Patient verbalized understanding of these instructions and education.  Patient Consent Given: Yes Education handout provided: Previously provided Muscles treated: bil lumbar multifidi and gluteals  Treatment response/outcome: Utilized skilled palpation to identify trigger points.  During dry needling able to palpate muscle twitch and muscle elongation   Skilled palpation and monitoring by PT during dry needling                                                                                                                  DATE:   09/11/22  See pt ed and HEP - no charge MCD  PATIENT EDUCATION:  Education details: PT eval findings, anticipated POC, and initial HEP  Person educated: Patient Education method: Explanation, Demonstration, and Handouts Education comprehension: verbalized understanding and returned demonstration  HOME EXERCISE PROGRAM: Access Code: [email or web address] URL: [email or web address] Date: 09/16/2022 Prepared by: Tresa Endo  Exercises - Modified Thomas Stretch  - 1 x daily - 7 x weekly - 1 sets - 3 reps - 30 sec hold - Child's Pose Stretch  - 1 x daily - 7 x weekly - 1 sets - 3 reps - 20-30 sec hold - Child's Pose with Sidebending  - 2 x daily - 7 x weekly - 1 sets - 2 reps - 20-30 sec sec hold - Supine Bridge  - 2 x daily - 7 x weekly - 1-3 sets - 10 reps - Seated Piriformis Stretch with Trunk Bend  - 2 x daily - 7 x weekly - 1 sets - 3 reps - Seated Hamstring Stretch  - 2 x daily - 7 x weekly - 1 sets - 3 reps - Clamshell  - 2 x daily - 7 x  weekly - 2 sets - 10 reps  Patient Education - Trigger Point Dry Needling  ASSESSMENT:  CLINICAL IMPRESSION: First  time follow-up after initial evaluation.   Pt is independent and compliant with HEP issued at evaluation.  PT added to HEP to include seated stretches that she can do at work and additional strength exercises.  Pt had good response to DN with twitch response and improved tissue mobility at the end of the session.  Patient will benefit from skilled PT to address the below impairments and improve overall function.    OBJECTIVE IMPAIRMENTS: decreased activity tolerance, decreased ROM, decreased strength, hypomobility, increased muscle spasms, impaired flexibility, improper body mechanics, postural dysfunction, obesity, and pain.   ACTIVITY LIMITATIONS: sitting, standing, stairs, and locomotion level  PARTICIPATION LIMITATIONS:  not limited but painful  PERSONAL FACTORS: 1 comorbidity: post partum  are also affecting patient's functional outcome.   REHAB POTENTIAL: Excellent  CLINICAL DECISION MAKING: Stable/uncomplicated  EVALUATION COMPLEXITY: Low   GOALS: Goals reviewed with patient? Yes  SHORT TERM GOALS: Target date: 10/05/22 Ind with initial HEP Baseline: No HEP Goal status: INITIAL  2.  Decreased hip and back pain by 50% to improve QOL Baseline: 5/10 Goal status: INITIAL   LONG TERM GOALS: Target date: 10/26/22  Ind with advanced HEP for core/LE strength  Baseline: initial HEP Goal status: INITIAL  2.  Decreased hip and back pain by >=80% to improve QOL. Baseline: 5/10 Goal status: INITIAL  3.  Improved Modified Oswestry to 11/50 showing functional improvement Baseline: 17 / 50 = 34.0 % Goal status: INITIAL  4.  Improved 5XSTS to <= 10 sec with minimal pain provocation showing improved strength Baseline: 24 sec with 7/10 pain Goal status: INITIAL  5.  Able to demonstrate correct body mechanics for lifting to prevent further injury Baseline:  decreased core stabilization Goal status: INITIAL    PLAN:  PT FREQUENCY: 1-2x/week  PT DURATION: 6 weeks  PLANNED INTERVENTIONS: Therapeutic exercises, Therapeutic activity, Neuromuscular re-education, Patient/Family education, Self Care, Joint mobilization, Dry Needling, Electrical stimulation, Spinal mobilization, Cryotherapy, Moist heat, Traction, and Manual therapy  PLAN FOR NEXT SESSION: assess response to DN, body mechanics/ADL mods  Lorrene Reid, PT 09/16/22 5:04 PM

## 2022-09-24 ENCOUNTER — Ambulatory Visit: Payer: Medicaid Other | Admitting: Physical Therapy

## 2022-09-25 ENCOUNTER — Encounter: Payer: Medicaid Other | Admitting: Physical Therapy

## 2022-09-29 ENCOUNTER — Ambulatory Visit: Payer: Medicaid Other | Admitting: Physical Therapy

## 2022-09-29 ENCOUNTER — Encounter: Payer: Self-pay | Admitting: Physical Therapy

## 2022-09-29 DIAGNOSIS — M25552 Pain in left hip: Secondary | ICD-10-CM

## 2022-09-29 DIAGNOSIS — M5459 Other low back pain: Secondary | ICD-10-CM

## 2022-09-29 DIAGNOSIS — M25551 Pain in right hip: Secondary | ICD-10-CM

## 2022-09-29 NOTE — Therapy (Addendum)
OUTPATIENT PHYSICAL THERAPY TREATMENT   Patient Name: Carolyn Alvarez MRN: [id number] DOB:[birth year], 32 y.o., female Today's Date: 09/29/2022  END OF SESSION:  PT End of Session - 09/29/22 0852     Visit Number 3    Date for PT Re-Evaluation 10/26/22    Authorization Type Healthy [employer]- [employer]: 6 visits, 09/11/2022-11/09/2022    Authorization Time Period requested 09/14/22 to 10/26/22    Authorization - Visit Number 2    Authorization - Number of Visits 6    PT Start Time 0848    PT Stop Time 0930    PT Time Calculation (min) 42 min    Activity Tolerance Patient tolerated treatment well    Behavior During Therapy [facility] for tasks assessed/performed               Past Medical History:  Diagnosis Date   Asthma    Depression    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   Depression, recurrent (HCC) 11/27/2008   Eczema    History of gonorrhea 08/01/2014   Left foot pain 12/10/2020   Mild persistent asthma without complication 06/05/2020   Recurrent upper respiratory infection (URI)    Seasonal and perennial allergic rhinitis 06/05/2020   Suicidal ideation 12/31/2019   Urticaria    Past Surgical History:  Procedure Laterality Date   WISDOM TOOTH EXTRACTION     Patient Active Problem List   Diagnosis Date Noted   Mild intermittent asthma 08/14/2022   BMI 40.0-44.9, adult (HCC) 08/14/2022   History of shoulder dystocia in prior pregnancy 06/29/2022   History of gestational hypertension 04/14/2022   History of gestational diabetes 03/24/2022    PCP: Shelby Mattocks, DO   REFERRING PROVIDER: Reva Bores, MD   REFERRING DIAG:   [phone] (ICD-10-CM) - Bilateral hip pain    THERAPY DIAG:  Other low back pain  Pain in right hip  Pain in left hip  Rationale for Evaluation and Treatment: Rehabilitation  ONSET DATE: 07/03/22  SUBJECTIVE:   SUBJECTIVE STATEMENT: I felt better for about 2 days after the DN.  I went back to work at [employer].  Today the Lt side  hurts more than the Rt. (Points to SI joints and lateral hip)  PERTINENT HISTORY:  10 weeks post partum PAIN:  Are you having pain? Yes: NPRS scale: 5/10 Pain location: low back/sacrum Pain description: nagging ache Aggravating factors: standing and walking, prolonged  Relieving factors: lying down  PRECAUTIONS: None  RED FLAGS: None   WEIGHT BEARING RESTRICTIONS: No  FALLS:  Has patient fallen in last 6 months? Yes. Number of falls 1 fell forward at 23 weeks tripped on a speed bump  LIVING ENVIRONMENT: Lives with: lives with their family Lives in: House/apartment Stairs: Yes: External: 3 flights steps; can reach both Has following equipment at home: None  OCCUPATION: retail, on her feet at [employer]  PLOF: Independent  PATIENT GOALS: no meds to feel better  NEXT MD VISIT: none scheduled  OBJECTIVE:   DIAGNOSTIC FINDINGS: none  PATIENT SURVEYS:  Modified Oswestry  17 / 50 = 34.0 %   COGNITION: Overall cognitive status: Within functional limits for tasks assessed     SENSATION: WFL  MUSCLE LENGTH: Tight Hamstrings B R piriformis and QL/lumbar  POSTURE: increased lumbar lordosis  PALPATION: Pain with UPA R lumbar and stiff  LUMBAR ROM:   Active  A/PROM  eval  Flexion full  Extension Full*  Right lateral flexion 75%  Left lateral flexion 75%  Right rotation 75%  Left rotation 75%   (Blank rows = not tested)* pain   LOWER EXTREMITY ROM:R ER and L IR some tightness else WNL   LOWER EXTREMITY MMT:  MMT Right eval Left eval  Hip flexion 5 some pain in lateral hip 5  Hip extension 5 4 poor stab  Hip abduction 5 5  Hip adduction 4 4  Hip internal rotation    Hip external rotation    Knee flexion 5 5  Knee extension 5 5  Ankle dorsiflexion 5 5  Ankle plantarflexion    Ankle inversion    Ankle eversion     (Blank rows = not tested)  LOWER EXTREMITY SPECIAL TESTS:  Hip special tests: Maisie Fus test: negative and Hip scouring test: positive   R  FUNCTIONAL TESTS:  5 times sit to stand: 24 sec  pain 7/10   TODAY'S TREATMENT:           DATE:   09/29/22: NuStep L6 x 5' PT present to disucss status Seated blue physioball rollouts 3-way x 5 each Seated lumbar hang holding 10lb kbell 2x10" Seated deadlift 10lb kbell x 10 Squat to chair hold 10lb at chest x10 SL clam x 10 each with purposeful TA indraw before each rep Articulating bridge with hip abd black tband Supine alt LE march to 90/90 single leg with bil UE toward ceiling x 20 Quadupred TA indraw with exhale x 10 reps Trigger Point Dry-Needling  Treatment instructions: Expect mild to moderate muscle soreness. S/S of pneumothorax if dry needled over a lung field, and to seek immediate medical attention should they occur. Patient verbalized understanding of these instructions and education.  Patient Consent Given: Yes Education handout provided: Previously provided Muscles treated: bil lumbar mutlifidi Electrical stimulation performed: No Parameters: N/A Treatment response/outcome: twitch, release of Rt, Lt side all felt too sharp to treat at full depth so d/c'd  Gave black tied tband for bridge, encouraged more focus on TA indraw within current HEP  09/16/22:  NuStep: Level 3x 6 minutes-PT present to discuss progress  Marjo Bicker pose and lateral childs pose 2x20 seconds  Thomas stretch 2x20 seconds Bridge x10 Sidelying clam x10 Seated hamstring and figure 4 stretch 2x20 seconds  Trigger Point Dry-Needling  Treatment instructions: Expect mild to moderate muscle soreness. S/S of pneumothorax if dry needled over a lung field, and to seek immediate medical attention should they occur. Patient verbalized understanding of these instructions and education.  Patient Consent Given: Yes Education handout provided: Previously provided Muscles treated: bil lumbar multifidi and gluteals  Treatment response/outcome: Utilized skilled palpation to identify trigger points.  During dry  needling able to palpate muscle twitch and muscle elongation   Skilled palpation and monitoring by PT during dry needling                                                                                                                  DATE:   09/11/22  See pt ed and HEP - no charge MCD  PATIENT EDUCATION:  Education  details: PT eval findings, anticipated POC, and initial HEP  Person educated: Patient Education method: Explanation, Demonstration, and Handouts Education comprehension: verbalized understanding and returned demonstration  HOME EXERCISE PROGRAM: Access Code: [email or web address] URL: [email or web address] Date: 09/16/2022 Prepared by: Tresa Endo  Exercises - Modified Thomas Stretch  - 1 x daily - 7 x weekly - 1 sets - 3 reps - 30 sec hold - Child's Pose Stretch  - 1 x daily - 7 x weekly - 1 sets - 3 reps - 20-30 sec hold - Child's Pose with Sidebending  - 2 x daily - 7 x weekly - 1 sets - 2 reps - 20-30 sec sec hold - Supine Bridge  - 2 x daily - 7 x weekly - 1-3 sets - 10 reps - Seated Piriformis Stretch with Trunk Bend  - 2 x daily - 7 x weekly - 1 sets - 3 reps - Seated Hamstring Stretch  - 2 x daily - 7 x weekly - 1 sets - 3 reps - Clamshell  - 2 x daily - 7 x weekly - 2 sets - 10 reps  Patient Education - Trigger Point Dry Needling  ASSESSMENT:  CLINICAL IMPRESSION: Pt reports initial DN helped for about 2 days.  She has returned to work and has manageable levels of pain with standing (at [employer]).  Her baby is now 13lb and she has some trouble lifting him.  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  She is able to recruit but not hold for more than one rep of other therex overlay at this time.  Black tband for added hip abd within bridge helped reduce bil SI joint pain with bridges so added this to bridge for HEP.   OBJECTIVE IMPAIRMENTS: decreased activity tolerance, decreased ROM, decreased strength, hypomobility, increased muscle spasms,  impaired flexibility, improper body mechanics, postural dysfunction, obesity, and pain.   ACTIVITY LIMITATIONS: sitting, standing, stairs, and locomotion level  PARTICIPATION LIMITATIONS:  not limited but painful  PERSONAL FACTORS: 1 comorbidity: post partum  are also affecting patient's functional outcome.   REHAB POTENTIAL: Excellent  CLINICAL DECISION MAKING: Stable/uncomplicated  EVALUATION COMPLEXITY: Low   GOALS: Goals reviewed with patient? Yes  SHORT TERM GOALS: Target date: 10/05/22 Ind with initial HEP Baseline: No HEP Goal status: ongoing  2.  Decreased hip and back pain by 50% to improve QOL Baseline: 5/10 Goal status: INITIAL   LONG TERM GOALS: Target date: 10/26/22  Ind with advanced HEP for core/LE strength  Baseline: initial HEP Goal status: INITIAL  2.  Decreased hip and back pain by >=80% to improve QOL. Baseline: 5/10 Goal status: INITIAL  3.  Improved Modified Oswestry to 11/50 showing functional improvement Baseline: 17 / 50 = 34.0 % Goal status: INITIAL  4.  Improved 5XSTS to <= 10 sec with minimal pain provocation showing improved strength Baseline: 24 sec with 7/10 pain Goal status: INITIAL  5.  Able to demonstrate correct body mechanics for lifting to prevent further injury Baseline: decreased core stabilization Goal status: INITIAL    PLAN:  PT FREQUENCY: 1-2x/week  PT DURATION: 6 weeks  PLANNED INTERVENTIONS: Therapeutic exercises, Therapeutic activity, Neuromuscular re-education, Patient/Family education, Self Care, Joint mobilization, Dry Needling, Electrical stimulation, Spinal mobilization, Cryotherapy, Moist heat, Traction, and Manual therapy  PLAN FOR NEXT SESSION: assess response to DN, body mechanics/ADL mods  Shravan Salahuddin, PT 09/29/22 1:41 PM  PHYSICAL THERAPY DISCHARGE SUMMARY  Visits from Start of Care: 3  Current functional level  related to goals / functional outcomes: See above for most current PT status.   Pt didn't return to PT.     Remaining deficits: See above.    Education / Equipment: HEP   Patient agrees to discharge. Patient goals were not met. Patient is being discharged due to not returning since the last visit.  Lorrene Reid, PT 03/11/23 4:41 PM

## 2022-10-06 ENCOUNTER — Encounter: Payer: Medicaid Other | Admitting: Physical Therapy

## 2022-10-26 ENCOUNTER — Other Ambulatory Visit: Payer: Medicaid Other

## 2022-11-02 ENCOUNTER — Ambulatory Visit: Payer: Medicaid Other | Admitting: Student

## 2022-11-13 ENCOUNTER — Ambulatory Visit (INDEPENDENT_AMBULATORY_CARE_PROVIDER_SITE_OTHER): Payer: Medicaid Other | Admitting: Mental Health

## 2022-11-13 DIAGNOSIS — F321 Major depressive disorder, single episode, moderate: Secondary | ICD-10-CM
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
## 2022-11-16 DIAGNOSIS — F321 Major depressive disorder, single episode, moderate: Secondary | ICD-10-CM | POA: Insufficient documentation

## 2022-12-08 NOTE — Progress Notes (Deleted)
  SUBJECTIVE:   CHIEF COMPLAINT / HPI:   Presents today for Pap smear and vaccine update.  Recent Pap was October 08, 2021 negative for high-risk HPV with transformation zone present.  PERTINENT  PMH / PSH: GDM and gestational HTN  OBJECTIVE:  There were no vitals taken for this visit. Pelvic exam: {pelvic exam:315900}.   ASSESSMENT/PLAN:   Assessment & Plan  No follow-ups on file. Shelby Mattocks, DO 12/08/2022, 4:08 PM PGY-3, [facility] {    This will disappear when note is signed, click to select method of visit    :1}

## 2022-12-09 ENCOUNTER — Ambulatory Visit: Payer: Medicaid Other | Admitting: Student

## 2022-12-17 NOTE — Progress Notes (Signed)
    SUBJECTIVE:   Chief compliant/HPI: annual examination  Carolyn Alvarez is a 32 y.o. who presents today for an annual exam.   She is sexually active and her period is late 5 days.  She is breast-feeding, gave birth in May, not currently on any contraceptives.  Requests STD testing.  Asymptomatic.  Updated history tabs and problem list.   OBJECTIVE:  BP 132/76   Pulse 79   Wt 218 lb 6.4 oz (99.1 kg)   SpO2 98%   BMI 39.95 kg/m   General: Well-appearing, NAD CV: RRR, no murmurs auscultated Pulm: CTAB, normal WOB Pelvic exam: VULVA: normal appearing vulva with no masses, tenderness or lesions, VAGINA: vaginal discharge - white and creamy, CERVIX: normal appearing cervix without discharge or lesions, exam chaperoned by Gilberto Better, CMA.   ASSESSMENT/PLAN:   Assessment & Plan Annual physical exam She will return for nutrition discussion. See AVS for age appropriate recommendations.   PHQ score not completed. Blood pressure reviewed and at goal.  The patient currently uses nothing for contraception.  Strongly recommended consideration for contraceptives. Advanced directives not discussed  Considered the following items based upon USPSTF recommendations: HIV testing: not requested Hepatitis C: not requested Hepatitis B: not requested Syphilis if at high risk: not requested GC/CT at high risk, ordered.  Lipid panel (nonfasting or fasting) discussed based upon AHA recommendations and not ordered.  Consider repeat every 4-6 years.  Reviewed risk factors for latent tuberculosis and not indicated  Discussed family history, BRCA testing not discussed. Cervical cancer screening: prior Pap reviewed, repeat due in 2028 Immunizations: Declined flu Routine screening for STI (sexually transmitted infection) Urine pregnancy negative.  Check wet prep and screen for STDs as requested. Screening for diabetes mellitus (DM) History of gestational diabetes.  At increased risk  of developing type 2 diabetes.  Check A1c. Mild persistent asthma without complication Refilled albuterol inhaler.  She is not taking Singulair, continue cessation of Singulair while breast-feeding.  Shelby Mattocks, DO [facility]

## 2022-12-18 ENCOUNTER — Ambulatory Visit (INDEPENDENT_AMBULATORY_CARE_PROVIDER_SITE_OTHER): Payer: Medicaid Other | Admitting: Student

## 2022-12-18 ENCOUNTER — Encounter: Payer: Self-pay | Admitting: Student

## 2022-12-18 ENCOUNTER — Other Ambulatory Visit (HOSPITAL_COMMUNITY)
Admission: RE | Admit: 2022-12-18 | Discharge: 2022-12-18 | Disposition: A | Discharge status: Home / Self Care | Payer: Medicaid Other | Source: Ambulatory Visit | Attending: Family Medicine | Admitting: Family Medicine

## 2022-12-18 VITALS — BP 132/76 | HR 79 | Wt 218.4 lb

## 2022-12-18 DIAGNOSIS — Z113 Encounter for screening for infections with a predominantly sexual mode of transmission: Secondary | ICD-10-CM

## 2022-12-18 DIAGNOSIS — Z0000 Encounter for general adult medical examination without abnormal findings: Secondary | ICD-10-CM | POA: Diagnosis present

## 2022-12-18 DIAGNOSIS — Z131 Encounter for screening for diabetes mellitus: Secondary | ICD-10-CM | POA: Diagnosis not present

## 2022-12-18 DIAGNOSIS — J4530 Mild persistent asthma, uncomplicated: Secondary | ICD-10-CM | POA: Diagnosis not present

## 2022-12-18 LAB — POCT WET PREP (WET MOUNT)
Clue Cells Wet Prep Whiff POC: NEGATIVE
Trichomonas Wet Prep HPF POC: ABSENT

## 2022-12-18 LAB — POCT URINE PREGNANCY: Preg Test, Ur: NEGATIVE

## 2022-12-18 MED ORDER — ALBUTEROL SULFATE HFA 108 (90 BASE) MCG/ACT IN AERS
2.0000 | INHALATION_SPRAY | RESPIRATORY_TRACT | 1 refills | Status: DC | PRN
Start: 2022-12-18 — End: 2023-08-12

## 2022-12-18 NOTE — Patient Instructions (Addendum)
It was great to see you today! Thank you for choosing [facility] for your primary care.  Today we addressed: Your screening for STDs and doing a urine pregnancy.  Given you are breast-feeding, please continue to take your prenatal vitamin. Your blood pressure is appropriate.  I am also checking an A1c given you had gestational diabetes. I have refilled your albuterol inhaler.  You cannot take Singulair as you are breast-feeding. Focus on a healthy diet with mainly fruits and vegetables and limited intake of sugary beverages, and limiting processed foods.  If at some point you would like to discuss strictly nutrition, please make that appointment with me.  Things to do to Keep yourself Healthy  - Exercise at least 30-45 minutes a day, 3-4 days a week. >150 min of moderate intensity per week is advised. - Eat a low-fat diet with lots of fruits and vegetables, up to 7-9 servings per day. - Seatbelts can save your life. Wear them always. - Smoke detectors on every level of your home, check batteries every year. - Eye Doctor - have an eye exam every 1-2 years. - Safe sex - if you may be exposed to STDs, use a condom. - Alcohol If you drink, do it moderately, less than 2 drinks per day. - Health Care Power of Attorney.  Choose someone to speak for you if you are not able. - Depression is common in our stressful world.If you're feeling down or losing interest in things you normally enjoy, please come in for a visit.   If you haven't already, sign up for My Chart to have easy access to your labs results, and communication with your primary care physician. We are checking some labs today. If they are abnormal, I will call you. If they are normal, I will send you a MyChart message (if it is active) or a letter in the mail. If you do not hear about your labs in the next 2 weeks, please call the office. Return if symptoms worsen or fail to improve. Please arrive 15 minutes before your appointment  to ensure smooth check in process.  We appreciate your efforts in making this happen.  Thank you for allowing me to participate in your care, Shelby Mattocks, DO 12/18/2022, 11:12 AM PGY-3, [facility]

## 2022-12-18 NOTE — Assessment & Plan Note (Signed)
She will return for nutrition discussion. See AVS for age appropriate recommendations.   PHQ score not completed. Blood pressure reviewed and at goal.  The patient currently uses nothing for contraception.  Strongly recommended consideration for contraceptives. Advanced directives not discussed  Considered the following items based upon USPSTF recommendations: HIV testing: not requested Hepatitis C: not requested Hepatitis B: not requested Syphilis if at high risk: not requested GC/CT at high risk, ordered.  Lipid panel (nonfasting or fasting) discussed based upon AHA recommendations and not ordered.  Consider repeat every 4-6 years.  Reviewed risk factors for latent tuberculosis and not indicated  Discussed family history, BRCA testing not discussed. Cervical cancer screening: prior Pap reviewed, repeat due in 2028 Immunizations: Declined flu

## 2022-12-21 LAB — CERVICOVAGINAL ANCILLARY ONLY
Chlamydia: NEGATIVE
Comment: NEGATIVE
Comment: NEGATIVE
Comment: NORMAL
Neisseria Gonorrhea: NEGATIVE
Trichomonas: NEGATIVE

## 2022-12-25 ENCOUNTER — Encounter (HOSPITAL_COMMUNITY): Payer: Self-pay

## 2022-12-25 ENCOUNTER — Ambulatory Visit (HOSPITAL_COMMUNITY): Payer: Medicaid Other | Admitting: Mental Health

## 2022-12-25 DIAGNOSIS — F321 Major depressive disorder, single episode, moderate: Secondary | ICD-10-CM

## 2022-12-25 NOTE — Progress Notes (Unsigned)
THERAPIST PROGRESS NOTE Virtual Visit via Video Note  I connected with Carolyn Alvarez on 12/25/22 at  8:00 AM EST by a video enabled telemedicine application and verified that I am speaking with the correct person using two identifiers.  Location: Patient: home address on file Provider: office    I discussed the limitations of evaluation and management by telemedicine and the availability of in person appointments. The patient expressed understanding and agreed to proceed.  I discussed the assessment and treatment plan with the patient. The patient was provided an opportunity to ask questions and all were answered. The patient agreed with the plan and demonstrated an understanding of the instructions.   The patient was advised to call back or seek an in-person evaluation if the symptoms worsen or if the condition fails to improve as anticipated.  I provided 30 minutes of non-face-to-face time during this encounter.   [name], [facility]   Session Time: 8:10am ( 30 minutes)  Participation Level: Active  Behavioral Response: DisheveledAlertBlunted  Type of Therapy: Individual Therapy  Treatment Goals addressed: STG: "My attitude" Carolyn "[name]" will increase management of sxs of depression AEB use of x 3 effective coping skills with ability to communicate thoughts well per self report within the next 90 days    ProgressTowards Goals: Initial  Interventions: CBT and Supportive  Summary: Carolyn Alvarez is a 32 y.o. female who presents with dx of major moderate depression. Presents for tele-therapy alert and oriented; mood and affect neutral;blunted. Speech clear and coherent at normal rate and tone. Shares to have just work up and appears slightly disheveled. Notes to currently be at mothers home. Shares stressors related to no longer working sharing to have been fired and shares with therapist circumstances leading up to being fired. Notes feelings of  stress related to ability to pay bills. 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 Shares plans to study for exam and re-take and work to gain employment. Shares thoughts on relationship with son's father and difficult with communications. Shares desire to work on her "attitude" and notes mood can be low. Notes episodes of depression that can present and attempts to cope. Agrees to treatment plan. Denies SI/HI.   Suicidal/Homicidal: Nowithout intent/plan  Therapist Response: Therapist engaged Carolyn Alvarez in tele-therapy session. Reviewed intake assessment and reviewed bounds of confidentiality, informed consent. Assessed for current level of functioning, sxs management and level of functioning. Provided safe space to share thoughts and feelings in regards to mood and current stressors. Explored factors of stress and barriers to increasing ability to navigate depressive moods. Explored current relationship with others in life and provided supportive feedback. Engaged in treatment planning. Reviewed session and provided follow up.   Plan: Return again in  x8  weeks.  Diagnosis: Moderate major depression, single episode (HCC)  Collaboration of Care: Other None  Patient/Guardian was advised Release of Information must be obtained prior to any record release in order to collaborate their care with an outside provider. Patient/Guardian was advised if they have not already done so to contact the registration department to sign all necessary forms in order for [place] to release information regarding their care.   Consent: Patient/Guardian gives verbal consent for treatment and assignment of benefits for services provided during this visit. Patient/Guardian expressed understanding and agreed to proceed.   [name], [facility]  12/25/2022  

## 2023-01-01 ENCOUNTER — Ambulatory Visit: Payer: Medicaid Other | Admitting: Student

## 2023-01-01 NOTE — Progress Notes (Deleted)
  SUBJECTIVE:   CHIEF COMPLAINT / HPI:   Nutrition discussion:  24-hr recall:  (Up at  AM) B ( AM)-   Snk ( AM)-   L ( PM)-   Snk ( PM)-   D ( PM)-   Snk ( PM)-   Typical day? {yes no:314532}    PERTINENT  PMH / PSH: Obesity, gestational DM  OBJECTIVE:  There were no vitals taken for this visit. Physical Exam   ASSESSMENT/PLAN:   Assessment & Plan  No follow-ups on file. Shelby Mattocks, DO 01/01/2023, 7:52 AM PGY-3, [facility] {    This will disappear when note is signed, click to select method of visit    :1}

## 2023-02-04 ENCOUNTER — Encounter: Payer: Self-pay | Admitting: Student

## 2023-02-04 ENCOUNTER — Ambulatory Visit: Payer: Medicaid Other | Admitting: Student

## 2023-02-04 VITALS — BP 124/84 | HR 80 | Ht 62.0 in | Wt 223.8 lb

## 2023-02-04 DIAGNOSIS — M25562 Pain in left knee: Secondary | ICD-10-CM | POA: Insufficient documentation

## 2023-02-04 DIAGNOSIS — Z6841 Body Mass Index (BMI) 40.0 and over, adult: Secondary | ICD-10-CM

## 2023-02-04 NOTE — Patient Instructions (Signed)
It was great to see you today! Thank you for choosing [facility] for your primary care.  Today we addressed: Left knee pain: I am not concerned for fracture or ruptured ligament.  We have agreed to send you to physical therapy.  I have placed this referral.  You may use Tylenol and ibuprofen as needed. Nutrition: Next visit we will be doing a 24-hour recall.  Today we discussed how to read nutrition facts label.  At your next visit, please bring in a couple nutrition facts of some foods or drinks and some thoughts on what he would like to change in your life regarding nutrition and we can discuss some goals.  If you haven't already, sign up for My Chart to have easy access to your labs results, and communication with your primary care physician.  Return in about 1 month (around 03/07/2023) for Nutrition follow-up. Please arrive 15 minutes before your appointment to ensure smooth check in process.  We appreciate your efforts in making this happen.  Thank you for allowing me to participate in your care, Shelby Mattocks, DO 02/04/2023, 12:28 PM PGY-3, [facility]

## 2023-02-04 NOTE — Progress Notes (Cosign Needed)
  SUBJECTIVE:   CHIEF COMPLAINT / HPI:   Left knee pain: Went dancing on her [birth year] and that day started having some left knee pain. Now when she bends her knee, it bothers her. She hears the knee pop sometimes. Denies associated trauma or twisting injury during dancing. Hurts along the lateral side of left knee. Bending makes it worse.  She denies locking or catching.  She is able to walk around, do stairs, and wear heels however the heels are more uncomfortable. She has pain with putting pressure directly on the knee (such as when kneeling on the couch). Denies icing, or medications with the exception of her mom's muscle relaxer for one night.   Nutrition: 3/4 unsweetened tea, 1/4 sweet tea maybe 1-3 cups per day. Bottle of soda per day.   PERTINENT  PMH / PSH: Obesity  OBJECTIVE:  BP 124/84   Pulse 80   Ht 5\' 2"  (1.575 m)   Wt 223 lb 12.8 oz (101.5 kg)   SpO2 100%   BMI 40.93 kg/m  Knee, left: TTP noted at the anterolateral portion.  There is tenderness along the pes anserine bursa and tracely lateral joint line inspection was negative for erythema, ecchymosis, effusion, and swelling. No obvious bony abnormalities or signs of osteophyte development. Palpation yielded no asymmetric warmth; No joint line tenderness; No condyle tenderness; No patellar tenderness; No patellar crepitus. Patellar and quadriceps tendons unremarkable. FROM in flexion (135 degrees) and extension (0 degrees). Neurovascularly intact bilaterally.  - Cruciate Ligaments:   - Anterior Drawer:  NEG - Posterior Drawer: NEG   - Lachman:  NEG  - Collateral Ligaments:   - Varus/Valgus Stress test: NEG  - Meniscus:   - Thessaly: NEG   - McMurray's: NEG  ASSESSMENT/PLAN:   Assessment & Plan Left lateral knee pain There may be a minor injury of LCL or meniscus however given chronicity of 6 weeks, does not likely require further imaging workup nor surgical intervention.  Recommend PT with conservative  management.  Referral placed. BMI 40.0-44.9, adult [facility]) Initiated education regarding food and approach to reading nutrition labels.  Next visit will be dedicated strictly to nutrition.  At that time we will discuss 24-hour recall and set smart goals.  There is room for improvement regarding intake of sweetened beverages and exercise goals at the very least. Return in about 1 month (around 03/07/2023) for Nutrition follow-up. Shelby Mattocks, DO 02/05/2023, 7:09 PM PGY-3, [facility]

## 2023-02-05 NOTE — Assessment & Plan Note (Signed)
There may be a minor injury of LCL or meniscus however given chronicity of 6 weeks, does not likely require further imaging workup nor surgical intervention.  Recommend PT with conservative management.  Referral placed.

## 2023-02-05 NOTE — Assessment & Plan Note (Signed)
Initiated education regarding food and approach to reading nutrition labels.  Next visit will be dedicated strictly to nutrition.  At that time we will discuss 24-hour recall and set smart goals.  There is room for improvement regarding intake of sweetened beverages and exercise goals at the very least.

## 2023-02-26 ENCOUNTER — Ambulatory Visit: Payer: Medicaid Other

## 2023-03-01 ENCOUNTER — Encounter (HOSPITAL_COMMUNITY): Payer: Self-pay

## 2023-03-01 ENCOUNTER — Ambulatory Visit (HOSPITAL_COMMUNITY): Payer: Medicaid Other | Admitting: Mental Health

## 2023-03-01 ENCOUNTER — Telehealth (HOSPITAL_COMMUNITY): Payer: Self-pay | Admitting: Mental Health

## 2023-03-01 NOTE — Telephone Encounter (Signed)
Therapist contacted pt after failing to connect to tele-therapy session. Pt did appear to initially be present for session, however when therapist connected pt no longer present. Therapist sent link x 2 no further response. Contact pt via telephone on file no answer left VM. NS

## 2023-03-05 ENCOUNTER — Ambulatory Visit: Payer: Medicaid Other | Attending: Family Medicine

## 2023-03-11 NOTE — Progress Notes (Deleted)
  SUBJECTIVE:   CHIEF COMPLAINT / HPI:   Presents today for nutrition follow-up.  24-hr recall suggests intake of *** kcal:  (Up at  AM) B ( AM)-   Snk ( AM)-   L ( PM)-   Snk ( PM)-   D ( PM)-   Snk ( PM)-   Typical day? {yes no:314532}   PERTINENT  PMH / PSH: Obesity  OBJECTIVE:  There were no vitals taken for this visit. Physical Exam   ASSESSMENT/PLAN:   Assessment & Plan  No follow-ups on file. Shelby Mattocks, DO 03/11/2023, 8:24 AM PGY-3, [facility] {    This will disappear when note is signed, click to select method of visit    :1}

## 2023-03-12 ENCOUNTER — Ambulatory Visit: Payer: Medicaid Other | Admitting: Student

## 2023-03-13 NOTE — Progress Notes (Deleted)
  SUBJECTIVE:   CHIEF COMPLAINT / HPI:   Presents today for nutrition follow-up.  24-hr recall suggests intake of *** kcal:  (Up at  AM) B ( AM)-   Snk ( AM)-   L ( PM)-   Snk ( PM)-   D ( PM)-   Snk ( PM)-   Typical day? {yes no:314532}   PERTINENT  PMH / PSH: Obesity  OBJECTIVE:  There were no vitals taken for this visit. Physical Exam   ASSESSMENT/PLAN:   Assessment & Plan  No follow-ups on file. Carolyn Mattocks, DO 03/13/2023, 10:51 PM PGY-3, [facility] {    This will disappear when note is signed, click to select method of visit    :1}

## 2023-03-15 ENCOUNTER — Ambulatory Visit: Payer: Medicaid Other | Admitting: Student

## 2023-07-20 ENCOUNTER — Encounter: Payer: Self-pay | Admitting: *Deleted

## 2023-08-12 ENCOUNTER — Ambulatory Visit: Admitting: Student

## 2023-08-12 ENCOUNTER — Encounter: Payer: Self-pay | Admitting: Student

## 2023-08-12 VITALS — BP 111/74 | HR 66 | Ht 61.5 in | Wt 228.0 lb

## 2023-08-12 DIAGNOSIS — J4530 Mild persistent asthma, uncomplicated: Secondary | ICD-10-CM | POA: Diagnosis not present

## 2023-08-12 DIAGNOSIS — R350 Frequency of micturition: Secondary | ICD-10-CM

## 2023-08-12 LAB — POCT URINALYSIS DIP (MANUAL ENTRY)
Bilirubin, UA: NEGATIVE
Glucose, UA: NEGATIVE mg/dL
Ketones, POC UA: NEGATIVE mg/dL
Leukocytes, UA: NEGATIVE
Nitrite, UA: NEGATIVE
Protein Ur, POC: NEGATIVE mg/dL
Spec Grav, UA: 1.025 (ref 1.010–1.025)
Urobilinogen, UA: 0.2 U/dL
pH, UA: 5.5 (ref 5.0–8.0)

## 2023-08-12 LAB — POCT UA - MICROSCOPIC ONLY: WBC, Ur, HPF, POC: NONE SEEN (ref 0–5)

## 2023-08-12 MED ORDER — ALBUTEROL SULFATE HFA 108 (90 BASE) MCG/ACT IN AERS
2.0000 | INHALATION_SPRAY | RESPIRATORY_TRACT | 1 refills | Status: DC | PRN
Start: 2023-08-12 — End: 2023-11-09

## 2023-08-12 NOTE — Progress Notes (Signed)
    SUBJECTIVE:   CHIEF COMPLAINT / HPI:   Carolyn Alvarez  Carolyn Alvarez is a 33 year old female who presents with left-sided flank pain.  She experiences sharp, constant pain near the left kidney area for the past two weeks, which intensifies with movement and coughing. There is no history of kidney stones. A distant history of urinary tract infections is noted, with the last occurrence around the birth of her daughter sixteen years ago. She is currently menstruating, complicating the assessment of hematuria.  Endorses associated urgency and frequency but there is no dysuria. She has not been working for three to four weeks and denies recent excessive bending or lifting. She carries her one-year-old child regularly.  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 Differential includes UTI and musculoskeletal strain. UA will differentiate between UTI and musculoskeletal causes. - Order urinalysis to evaluate for UTI. - If UA is negative for UTI, recommend over-the-counter Voltaren gel for topical pain relief. - If UA is negative for UTI, advise ibuprofen  400 mg every 8 hours for 3 days, then as needed - Can consider muscle relaxer    Norleen April, MD [facility]

## 2023-08-12 NOTE — Patient Instructions (Signed)
 Pleasure to meet you today.  Today we ordered labs to check for possible UTI.  If it is negative I recommend getting over-the-counter Voltaren gel to apply on the affected area as this could be muscle strain.  And you can do ibuprofen  or Tylenol  for pain.

## 2023-09-23 ENCOUNTER — Ambulatory Visit (INDEPENDENT_AMBULATORY_CARE_PROVIDER_SITE_OTHER)

## 2023-09-23 DIAGNOSIS — Z23 Encounter for immunization: Secondary | ICD-10-CM

## 2023-09-24 NOTE — Progress Notes (Signed)
 Patient presents to nurse clinic for Tdap vaccination.   Administered in LD, site unremarkable, tolerated injection well.   Provided patient with updated immunization record.   Chiquita JAYSON English, RN

## 2023-11-02 ENCOUNTER — Ambulatory Visit: Admitting: Student

## 2023-11-02 VITALS — BP 130/84 | HR 83 | Ht 62.0 in | Wt 226.6 lb

## 2023-11-02 DIAGNOSIS — Z30014 Encounter for initial prescription of intrauterine contraceptive device: Secondary | ICD-10-CM

## 2023-11-02 DIAGNOSIS — Z3044 Encounter for surveillance of vaginal ring hormonal contraceptive device: Secondary | ICD-10-CM | POA: Insufficient documentation

## 2023-11-02 LAB — POCT URINE PREGNANCY: Preg Test, Ur: NEGATIVE

## 2023-11-02 MED ORDER — ETONOGESTREL-ETHINYL ESTRADIOL 0.12-0.015 MG/24HR VA RING
VAGINAL_RING | VAGINAL | 12 refills | Status: AC
Start: 2023-11-02 — End: ?

## 2023-11-02 NOTE — Patient Instructions (Signed)
 Pleasure to see you today.  Pregnancy test today was negative.  I have sent in prescription for nuvaring.  Please make sure to keep it inside it for 3 weeks and on the fourth week please take it out.

## 2023-11-02 NOTE — Progress Notes (Signed)
    SUBJECTIVE:   CHIEF COMPLAINT / HPI:   33 year old female presenting for birth control management Previously used OCP and NuvaRing ring with good tolerance Denies any history of blood clots, or migraine Periods has been regular every 28 days lasting up to 5 days  PERTINENT  PMH / PSH: Reviewed   OBJECTIVE:   BP 130/84   Pulse 83   Ht 5' 2 (1.575 m)   Wt 226 lb 9.6 oz (102.8 kg)   SpO2 100%   BMI 41.45 kg/m    Physical Exam General: Alert, well appearing, NAD Cardiovascular: RRR, No Murmurs, Normal S2/S2 Respiratory: CTAB, No wheezing or Rales Abdomen: No distension or tenderness  ASSESSMENT/PLAN:   Encounter for surveillance of vaginal ring hormonal contraceptive device Pregnancy test today was negative.  No contraindication for hormonal birth. - Rx NuvaRing ring -Ordered POC pregnancy test.     Norleen April, MD [facility]

## 2023-11-02 NOTE — Assessment & Plan Note (Addendum)
 Pregnancy test today was negative.  No contraindication for hormonal birth. - Rx NuvaRing ring -Ordered POC pregnancy test.

## 2023-11-09 ENCOUNTER — Telehealth: Payer: Self-pay

## 2023-11-09 DIAGNOSIS — J4530 Mild persistent asthma, uncomplicated: Secondary | ICD-10-CM

## 2023-11-09 MED ORDER — ALBUTEROL SULFATE HFA 108 (90 BASE) MCG/ACT IN AERS
2.0000 | INHALATION_SPRAY | RESPIRATORY_TRACT | 1 refills | Status: AC | PRN
Start: 2023-11-09 — End: ?

## 2023-11-09 NOTE — Telephone Encounter (Signed)
 Patient calls nurse line requesting a letter for school.   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   Patient reports that she will sometimes have episodes of coughing/hacking when she is in this classroom and will have to leave. In order for this to be excused she needs letter from provider.   She needs letter from provider stating that patient should be excused/ allowed to leave class if she feels that her allergies/asthma is being triggered.   If provider is able to create letter, please send through mychart.   She is also requesting refill on inhaler.   She denies current exacerbation/difficulty breathing.   Chiquita JAYSON English, RN

## 2023-11-11 ENCOUNTER — Other Ambulatory Visit (HOSPITAL_COMMUNITY): Payer: Self-pay

## 2023-11-19 ENCOUNTER — Ambulatory Visit (INDEPENDENT_AMBULATORY_CARE_PROVIDER_SITE_OTHER)

## 2023-11-19 VITALS — BP 120/80 | HR 70 | Wt 221.2 lb

## 2023-11-19 DIAGNOSIS — G4482 Headache associated with sexual activity: Secondary | ICD-10-CM

## 2023-11-19 DIAGNOSIS — F339 Major depressive disorder, recurrent, unspecified: Secondary | ICD-10-CM | POA: Diagnosis present

## 2023-11-19 MED ORDER — BUSPIRONE HCL 7.5 MG PO TABS
7.5000 mg | ORAL_TABLET | Freq: Two times a day (BID) | ORAL | 0 refills | Status: DC
Start: 2023-11-19 — End: 2023-11-29

## 2023-11-19 MED ORDER — PROPRANOLOL HCL 40 MG PO TABS: 40.0000 mg | ORAL_TABLET | Freq: Every day | ORAL | 0 refills | Status: DC

## 2023-11-19 NOTE — Patient Instructions (Addendum)
 Today we discussed your headaches that occur around intimacy. I am sending you in propanolol to help prevent these from happening. This is a medicine that you will take daily.   We also discussed your mood. You were wanting to try a medicine that is different from what you have tried in the past. I am going to send you in Buspirone. Take this medicine once in the morning and then again around lunch time.   If you are feeling suicidal or depression symptoms worsen please immediately go to:   If you are thinking about harming yourself or having thoughts of suicide, or if you know someone who is, seek help right away. If you are in crisis, make sure you are not left alone.  If someone else is in crisis, make sure he/she/they is not left alone  Call 988 OR 1-800-273-TALK  24 Hour Availability for Walk-IN services  [facility]  [address] [place], [place] [phone] Crisis [phone]    Other crisis resources:  [facility] [employer] (Domestic Violence, Rape & Victim Assistance [phone]  [facility] [employer] [facility]    (ONLY from 8am-4pm)    [phone]  [facility] Mobile Crisis Unit (24/7)   [phone]  [place]  National Suicide Hotline   6575429305 MERRILYN)

## 2023-11-19 NOTE — Progress Notes (Signed)
    SUBJECTIVE:   CHIEF COMPLAINT / HPI: Headaches during and after sex  Carolyn Alvarez  is a 33 year old female presenting for headaches that occur during and after sex.  The first episode of this occurred on 10/5 with her orgasm.  She describes the feeling as a truck rolling over her head.  She tried drinking water and it did not seem to help.  She had other episodes on 10/8 and 10/9.  Yesterday she took some ibuprofen  which helped her pain somewhat.  She is still currently experiencing a dull/less severe headache.  This headache seems to only occur with sex.  She states it increases in intensity with increasing sexual excitement and becomes abrupt with orgasm.  She states the pain is mostly in the back of her head and wraps around to her temples bilaterally.  She said the first night the pain was so severe she had difficulty sleeping.  However, the pain seems to become more dull after the initial severe pain.  Patient is curious if this has anything to do with her NuvaRing which she re-started 9/30.  Patient also endorses a worse mood recently.  She was on a cruise and states that she had to force herself to leave the room.  She spent most of her time in the room.  States she has a history of depression and currently sees a therapist.  She has tried multiple antidepressants before, but does not know which ones.  She states the last 1 she tried made her feel like a zombie.  Patient states this current bout of depression has been worse for about 2 weeks.  He denies any plan or intent to hurt herself at this time.  States she just has thoughts sometimes that she be better off dead.  She is also curious if her birth control has any role in her current depression.   PERTINENT  PMH / PSH: MDD  OBJECTIVE:   BP 120/80   Pulse 70   Wt 221 lb 3.2 oz (100.3 kg)   SpO2 100%   BMI 40.46 kg/m   General: Well-appearing female, NAD Cardiovascular: RRR, no M/R/G Respiratory: CTAB, normal work of  breathing on room air Abdomen: Soft, nontender to palpation, nondistended Neuro: CN II: PERRL CN III, IV,VI: EOMI CV V: Normal sensation in V1, V2, V3 CVII: Symmetric smile and brow raise CN VIII: Normal hearing CN IX,X: Symmetric palate raise  CN XI: 5/5 shoulder shrug CN XII: Symmetric tongue protrusion  UE and LE strength 5/5 2+ UE and LE reflexes  Normal sensation in UE and LE bilaterally   ASSESSMENT/PLAN:   Assessment & Plan Episode of recurrent major depressive disorder, unspecified depression episode severity PHQ-9 elevated to 20 today.  History of major depression disorder.  Has tried sertraline, citalopram , and escitalopram with varying side effects.  Patient interested in trying a different agent today.  Denies intent or plan to harm herself. - BuSpar 7.5 mg twice daily at morning and lunchtime - Hotline resources given - Continue therapy - Follow-up in 2 weeks  Primary headache associated with sexual activity Meeting criteria for primary headache associated with sexual activity. - Propranolol 40 mg daily for prevention - Will reassess at follow-up in 2 weeks     Carolyn KANDICE Lee, DO [facility]

## 2023-11-29 ENCOUNTER — Telehealth: Payer: Self-pay

## 2023-11-29 DIAGNOSIS — F339 Major depressive disorder, recurrent, unspecified: Secondary | ICD-10-CM

## 2023-11-29 DIAGNOSIS — G4482 Headache associated with sexual activity: Secondary | ICD-10-CM

## 2023-11-29 NOTE — Telephone Encounter (Signed)
 Patient calls nurse line in regards to Propranolol and Buspar.  She reports prescriptions are too expensive for her equally ~50.00s.  I called the pharmacy, Lennie is not enrolled with Medicaid.   Will need an attending for insurance coverage.   Will forward to afternoon preceptor.

## 2023-11-30 MED ORDER — BUSPIRONE HCL 7.5 MG PO TABS: 7.5000 mg | ORAL_TABLET | Freq: Two times a day (BID) | ORAL | 0 refills | Status: DC

## 2023-11-30 MED ORDER — PROPRANOLOL HCL 40 MG PO TABS: 40.0000 mg | ORAL_TABLET | Freq: Every day | ORAL | 0 refills | Status: DC

## 2023-12-01 ENCOUNTER — Other Ambulatory Visit: Payer: Self-pay | Admitting: Family Medicine

## 2023-12-01 NOTE — Telephone Encounter (Signed)
 Patient returns call to nurse line.   Advised that attending has sent in.   Chiquita JAYSON English, RN

## 2024-02-18 ENCOUNTER — Other Ambulatory Visit: Payer: Self-pay

## 2024-02-18 DIAGNOSIS — G4482 Headache associated with sexual activity: Secondary | ICD-10-CM

## 2024-02-18 DIAGNOSIS — F339 Major depressive disorder, recurrent, unspecified: Secondary | ICD-10-CM

## 2024-02-18 MED ORDER — PROPRANOLOL HCL 40 MG PO TABS: 40.0000 mg | ORAL_TABLET | Freq: Every day | ORAL | 0 refills | Status: AC

## 2024-02-18 MED ORDER — BUSPIRONE HCL 7.5 MG PO TABS: 7.5000 mg | ORAL_TABLET | Freq: Two times a day (BID) | ORAL | 0 refills | Status: AC

## 2024-02-20 ENCOUNTER — Emergency Department (HOSPITAL_COMMUNITY)

## 2024-02-20 ENCOUNTER — Emergency Department (HOSPITAL_COMMUNITY)
Admission: EM | Admit: 2024-02-20 | Discharge: 2024-02-20 | Disposition: A | Discharge status: Home / Self Care | Attending: Emergency Medicine | Admitting: Emergency Medicine

## 2024-02-20 ENCOUNTER — Other Ambulatory Visit: Payer: Self-pay

## 2024-02-20 DIAGNOSIS — J45909 Unspecified asthma, uncomplicated: Secondary | ICD-10-CM | POA: Insufficient documentation

## 2024-02-20 DIAGNOSIS — S8262XA Displaced fracture of lateral malleolus of left fibula, initial encounter for closed fracture: Secondary | ICD-10-CM | POA: Diagnosis not present

## 2024-02-20 DIAGNOSIS — S99912A Unspecified injury of left ankle, initial encounter: Secondary | ICD-10-CM | POA: Diagnosis present

## 2024-02-20 DIAGNOSIS — W109XXA Fall (on) (from) unspecified stairs and steps, initial encounter: Secondary | ICD-10-CM | POA: Diagnosis not present

## 2024-02-20 MED ORDER — IBUPROFEN 800 MG PO TABS: 800.0000 mg | ORAL_TABLET | Freq: Three times a day (TID) | ORAL | 0 refills | Status: AC

## 2024-02-20 MED ORDER — ACETAMINOPHEN 500 MG PO TABS
1000.0000 mg | ORAL_TABLET | Freq: Once | ORAL | Status: AC
  Administered 2024-02-20: 1000 mg via ORAL
  Filled 2024-02-20: qty 2

## 2024-02-20 MED ORDER — IBUPROFEN 800 MG PO TABS
800.0000 mg | ORAL_TABLET | Freq: Once | ORAL | Status: AC
  Administered 2024-02-20: 800 mg via ORAL
  Filled 2024-02-20: qty 1

## 2024-02-20 NOTE — Progress Notes (Signed)
 Orthopedic Tech Progress Note Patient Details:  Carolyn Alvarez  11-07-1990 [id number]  Ortho Devices Type of Ortho Device: Crutches Ortho Device/Splint Location: LLE Ortho Device/Splint Interventions: Ordered, Adjustment   Post Interventions Patient Tolerated: Ambulated well  Fernado Brigante E Donesha Wallander 02/20/2024, 3:30 PM

## 2024-02-20 NOTE — Discharge Instructions (Signed)
 It was a pleasure taking care of you today. You were seen in the Emergency Department for evaluation of ankle pain after a fall. Your work-up was reassuring. Your Xray showed evidence of a small fracture on the outside part of your foot.  Recommendation is to place you in a walking boot and have you weight-bear as tolerated.  As we discussed, I am also happy to provide you with crutches if you feel like you are unable to weight-bear.  I have sent you a prescription for high-dose ibuprofen  to take every 8 hours for the next 1 week for inflammation.  You may also take Tylenol  every 6 hours as needed for pain. Refer to the attached documentation for further management of your symptoms.  You will need to follow-up with orthopedics outpatient in 1 week.  I have provided their contact information on your discharge paperwork.  Please call them to schedule an appointment. Please return to the ER if you experience chest pain, trouble breathing, intractable nausea/vomiting or any other life threatening illnesses.

## 2024-02-20 NOTE — ED Provider Notes (Signed)
 " Carolyn Alvarez EMERGENCY DEPARTMENT AT [facility] Provider Note   CSN: [id number] Arrival date & time: 02/20/24  1131     Patient presents with: Carolyn Alvarez  is a 34 y.o. female with past medical history of gestational diabetes, mild intermittent asthma, who presents emergency department for evaluation of a fall.  Patient reports she was carrying her son down the stairs, when she tripped over a toy.  This caused her to fall halfway down the stairs, resulting in her twisting her ankle.  She is reporting pain and swelling to her foot.  She denies taking any medications prior to arrival.  Of note, patient's son is also being evaluated as a patient as she was caring him when she fell.   Fall       Prior to Admission medications  Medication Sig Start Date End Date Taking? Authorizing Provider  ibuprofen  (ADVIL ) 800 MG tablet Take 1 tablet (800 mg total) by mouth 3 (three) times daily. 02/20/24  Yes Kamaljit Hizer, Marry RAMAN, PA-C  albuterol  (PROAIR  HFA) 108 (90 Base) MCG/ACT inhaler Inhale 2 puffs into the lungs every 4 (four) hours as needed for wheezing or shortness of breath. DISPENSE brand or generic which ever is preferred 11/09/23   Nicholas Bar, MD  busPIRone  (BUSPAR ) 7.5 MG tablet Take 1 tablet (7.5 mg total) by mouth 2 (two) times daily. Take in the morning and at lunch time. 02/18/24   Nicholas Bar, MD  etonogestrel -ethinyl estradiol  (NUVARING) 0.12-0.015 MG/24HR vaginal ring Insert vaginally and leave in place for 3 consecutive weeks, then remove for 1 week. 11/02/23   Rosendo Norleen BROCKS, MD  montelukast  (SINGULAIR ) 10 MG tablet Take 1 tablet (10 mg total) by mouth at bedtime. Patient not taking: Reported on 11/19/2023 04/14/22   Anyanwu, Ugonna A, MD  Prenatal Vit-Fe Fumarate-FA (MULTIVITAMIN-PRENATAL) 27-0.8 MG TABS tablet Take 1 tablet by mouth once daily 12/08/23   Fredirick Glenys RAMAN, MD  propranolol  (INDERAL ) 40 MG tablet Take 1 tablet (40 mg total) by mouth daily.  02/18/24   Nicholas Bar, MD    Allergies: Citrus and Shellfish allergy     Review of Systems  Musculoskeletal:  Positive for joint swelling.    Updated Vital Signs BP (!) 149/88 Comment: Simultaneous filing. User may not have seen previous data.  Pulse 70   Temp 98 F (36.7 C) (Oral)   Resp 18   LMP 01/25/2024 (Approximate)   SpO2 100%   Physical Exam Vitals and nursing note reviewed.  Constitutional:      Appearance: Normal appearance. She is not ill-appearing.  Eyes:     General: No scleral icterus. Pulmonary:     Effort: Pulmonary effort is normal. No respiratory distress.  Musculoskeletal:        General: Swelling and deformity present.     Comments: Patient with left lateral malleolus swelling and pain.  Patient able to plantar and dorsiflex without difficulty.  She is unable to invert or evert her ankle due to pain.  DP pulses palpable.  Skin:    Coloration: Skin is not jaundiced.  Neurological:     General: No focal deficit present.     Mental Status: She is alert.  Psychiatric:        Mood and Affect: Mood normal.     (all labs ordered are listed, but only abnormal results are displayed) Labs Reviewed - No data to display  EKG: None  Radiology: DG Ankle 2 Views Left Result Date: 02/20/2024 EXAM: 2  VIEW(S) XRAY OF THE LEFT ANKLE 02/20/2024 12:17:00 PM CLINICAL HISTORY: 34 year old female. Left ankle pain after falling down stairs. COMPARISON: Previous left ankle series 12/10/2020. FINDINGS: BONES AND JOINTS: Small avulsion fracture of the distal aspect of the lateral malleolus. No malalignment. SOFT TISSUES: Diffuse soft tissue swelling of the ankle, most pronounced laterally. IMPRESSION: 1. Small avulsion fracture at the left lateral malleolus.  Soft tissue swelling. Electronically signed by: Helayne Hurst MD MD 02/20/2024 12:54 PM EST RP Workstation: [id number]     Procedures   Medications Ordered in the ED  acetaminophen  (TYLENOL ) tablet 1,000 mg (1,000 mg  Oral Given 02/20/24 1348)  ibuprofen  (ADVIL ) tablet 800 mg (800 mg Oral Given 02/20/24 1415)    Clinical Course as of 02/20/24 1441  Sun Feb 20, 2024  1352 DG Ankle 2 Views Left [GD]    Clinical Course User Index [GD] Carolyn Marry RAMAN, PA-C                               Medical Decision Making Amount and/or Complexity of Data Reviewed Radiology: ordered. Decision-making details documented in ED Course.  Risk OTC drugs. Prescription drug management.   34 year old female who presents emergency department for evaluation of left ankle pain after a fall.  Differential diagnosis: Ankle sprain, ankle fracture, distal tip/fib fracture, soft tissue swelling.  X-ray was obtained, which does show a small lateral malleolus avulsion fracture.  I personally viewed and interpreted these images and agree with radiologist interpretation.  I gave patient Tylenol  and high-dose ibuprofen  for pain and inflammation.  Consult to orthopedics was placed, with recommendation for patient to be placed in a cam boot and weight-bear as tolerated.  Patient is agreeable to this plan.  I did also inform her I would place her on crutches if she was unable to ambulate.  Recommendation for patient to follow-up with Ortho in about 1 week.  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  I am agreeable to this.  Additionally, I have sent patient home with a prescription for in her milligrams of ibuprofen  to take every 8 hours for the next 1 week.  I also advised patient to take Tylenol  for pain as well as elevate her foot as much as possible.  Patient's vital signs are stable.  Patient is appropriate for discharge at this time.   Final diagnoses:  Avulsion fracture of lateral malleolus, left, closed, initial encounter    ED Discharge Orders          Ordered    ibuprofen  (ADVIL ) 800 MG tablet  3 times daily        02/20/24 1441                Carolyn Alvarez 02/20/24 1441    Jerrol Agent, MD 02/20/24 1552  "

## 2024-02-20 NOTE — Progress Notes (Signed)
 Orthopedic Tech Progress Note Patient Details:  Carolyn Alvarez  [birth year] [id number]  Ortho Devices Type of Ortho Device: CAM walker Ortho Device/Splint Location: LLE Ortho Device/Splint Interventions: Application   Post Interventions Patient Tolerated: Well  Massie BRAVO Denay Pleitez 02/20/2024, 2:32 PM

## 2024-02-20 NOTE — ED Notes (Signed)
 Pt d/c home with family per EDP order. Discharge summary reviewed, verbalize understanding. NAD.

## 2024-02-20 NOTE — ED Triage Notes (Signed)
 Patient reports fall down stairs about halfway down, denies hitting head/LOC/thinners. C/o left ankle pain. Patient is alert and oriented x 4. Airway patent, respirations even and unlabored. Skin normal, warm and dry.
